# Patient Record
Sex: Male | Born: 1935 | Race: White | Hispanic: No | Marital: Married | Smoking: Former smoker
Health system: Southern US, Community
[De-identification: ages and names within clinical notes are randomized; demographics above are authoritative.]

## PROBLEM LIST (undated history)

## (undated) DIAGNOSIS — Z951 Presence of aortocoronary bypass graft: Secondary | ICD-10-CM

## (undated) DIAGNOSIS — I4891 Unspecified atrial fibrillation: Secondary | ICD-10-CM

## (undated) DIAGNOSIS — G20 Parkinson's disease: Secondary | ICD-10-CM

## (undated) DIAGNOSIS — I714 Abdominal aortic aneurysm, without rupture, unspecified: Secondary | ICD-10-CM

## (undated) DIAGNOSIS — Z87442 Personal history of urinary calculi: Secondary | ICD-10-CM

## (undated) DIAGNOSIS — R32 Unspecified urinary incontinence: Secondary | ICD-10-CM

## (undated) DIAGNOSIS — E785 Hyperlipidemia, unspecified: Secondary | ICD-10-CM

## (undated) DIAGNOSIS — K219 Gastro-esophageal reflux disease without esophagitis: Secondary | ICD-10-CM

## (undated) DIAGNOSIS — I2510 Atherosclerotic heart disease of native coronary artery without angina pectoris: Secondary | ICD-10-CM

## (undated) DIAGNOSIS — I499 Cardiac arrhythmia, unspecified: Secondary | ICD-10-CM

## (undated) DIAGNOSIS — R011 Cardiac murmur, unspecified: Secondary | ICD-10-CM

## (undated) DIAGNOSIS — G20A1 Parkinson's disease without dyskinesia, without mention of fluctuations: Secondary | ICD-10-CM

## (undated) DIAGNOSIS — C801 Malignant (primary) neoplasm, unspecified: Secondary | ICD-10-CM

## (undated) DIAGNOSIS — Z9889 Other specified postprocedural states: Secondary | ICD-10-CM

## (undated) DIAGNOSIS — N189 Chronic kidney disease, unspecified: Secondary | ICD-10-CM

## (undated) DIAGNOSIS — K5730 Diverticulosis of large intestine without perforation or abscess without bleeding: Secondary | ICD-10-CM

## (undated) DIAGNOSIS — R112 Nausea with vomiting, unspecified: Secondary | ICD-10-CM

## (undated) DIAGNOSIS — M1990 Unspecified osteoarthritis, unspecified site: Secondary | ICD-10-CM

## (undated) DIAGNOSIS — I10 Essential (primary) hypertension: Secondary | ICD-10-CM

## (undated) DIAGNOSIS — T8859XA Other complications of anesthesia, initial encounter: Secondary | ICD-10-CM

## (undated) DIAGNOSIS — T4145XA Adverse effect of unspecified anesthetic, initial encounter: Secondary | ICD-10-CM

## (undated) DIAGNOSIS — R0602 Shortness of breath: Secondary | ICD-10-CM

## (undated) HISTORY — PX: PROSTATECTOMY: SHX69

## (undated) HISTORY — DX: Unspecified atrial fibrillation: I48.91

## (undated) HISTORY — PX: APPENDECTOMY: SHX54

## (undated) HISTORY — PX: CATARACT EXTRACTION, BILATERAL: SHX1313

## (undated) HISTORY — PX: CORONARY ARTERY BYPASS GRAFT: SHX141

## (undated) HISTORY — DX: Hyperlipidemia, unspecified: E78.5

## (undated) HISTORY — DX: Presence of aortocoronary bypass graft: Z95.1

## (undated) HISTORY — PX: TONSILLECTOMY: SUR1361

## (undated) HISTORY — PX: CHOLECYSTECTOMY: SHX55

## (undated) HISTORY — DX: Atherosclerotic heart disease of native coronary artery without angina pectoris: I25.10

## (undated) HISTORY — PX: HERNIA REPAIR: SHX51

## (undated) HISTORY — DX: Essential (primary) hypertension: I10

---

## 1994-02-20 HISTORY — PX: CARDIAC CATHETERIZATION: SHX172

## 1999-09-02 DIAGNOSIS — Z951 Presence of aortocoronary bypass graft: Secondary | ICD-10-CM

## 1999-09-02 HISTORY — PX: CORONARY ARTERY BYPASS GRAFT: SHX141

## 1999-09-02 HISTORY — DX: Presence of aortocoronary bypass graft: Z95.1

## 2009-08-29 ENCOUNTER — Ambulatory Visit: Admission: RE | Admit: 2009-08-29 | Discharge: 2009-08-31 | Payer: Self-pay | Admitting: Radiation Oncology

## 2009-09-04 ENCOUNTER — Ambulatory Visit: Admission: RE | Admit: 2009-09-04 | Discharge: 2009-09-11 | Payer: Self-pay | Admitting: Radiation Oncology

## 2009-10-08 ENCOUNTER — Encounter (INDEPENDENT_AMBULATORY_CARE_PROVIDER_SITE_OTHER): Payer: Self-pay | Admitting: Urology

## 2009-10-08 ENCOUNTER — Inpatient Hospital Stay (HOSPITAL_COMMUNITY): Admission: RE | Admit: 2009-10-08 | Discharge: 2009-10-10 | Payer: Self-pay | Admitting: Urology

## 2010-11-21 LAB — PROTIME-INR
INR: 1.08 (ref 0.00–1.49)
Prothrombin Time: 13.9 seconds (ref 11.6–15.2)

## 2010-11-21 LAB — BASIC METABOLIC PANEL
BUN: 21 mg/dL (ref 6–23)
CO2: 28 mEq/L (ref 19–32)
Chloride: 107 mEq/L (ref 96–112)
Potassium: 3.9 mEq/L (ref 3.5–5.1)

## 2010-11-21 LAB — CBC
HCT: 44.1 % (ref 39.0–52.0)
Platelets: 152 10*3/uL (ref 150–400)
RBC: 4.96 MIL/uL (ref 4.22–5.81)
WBC: 4.8 10*3/uL (ref 4.0–10.5)

## 2010-11-21 LAB — HEMOGLOBIN AND HEMATOCRIT, BLOOD
HCT: 40.4 % (ref 39.0–52.0)
Hemoglobin: 13.8 g/dL (ref 13.0–17.0)

## 2012-05-25 ENCOUNTER — Other Ambulatory Visit (HOSPITAL_COMMUNITY): Payer: Self-pay | Admitting: Internal Medicine

## 2012-05-25 ENCOUNTER — Other Ambulatory Visit (HOSPITAL_COMMUNITY): Payer: Self-pay

## 2012-05-25 ENCOUNTER — Ambulatory Visit (HOSPITAL_COMMUNITY)
Admission: RE | Admit: 2012-05-25 | Discharge: 2012-05-25 | Disposition: A | Discharge status: Home / Self Care | Payer: Medicare PPO | Source: Ambulatory Visit | Attending: Internal Medicine | Admitting: Internal Medicine

## 2012-05-25 DIAGNOSIS — R42 Dizziness and giddiness: Secondary | ICD-10-CM | POA: Insufficient documentation

## 2012-05-25 DIAGNOSIS — R269 Unspecified abnormalities of gait and mobility: Secondary | ICD-10-CM | POA: Insufficient documentation

## 2012-05-25 DIAGNOSIS — I4891 Unspecified atrial fibrillation: Secondary | ICD-10-CM | POA: Insufficient documentation

## 2012-05-25 DIAGNOSIS — G450 Vertebro-basilar artery syndrome: Secondary | ICD-10-CM | POA: Insufficient documentation

## 2012-05-25 DIAGNOSIS — I639 Cerebral infarction, unspecified: Secondary | ICD-10-CM

## 2012-05-25 DIAGNOSIS — Z7901 Long term (current) use of anticoagulants: Secondary | ICD-10-CM | POA: Insufficient documentation

## 2012-05-25 DIAGNOSIS — I6789 Other cerebrovascular disease: Secondary | ICD-10-CM | POA: Insufficient documentation

## 2012-12-01 ENCOUNTER — Other Ambulatory Visit: Payer: Self-pay | Admitting: Urology

## 2012-12-05 ENCOUNTER — Encounter: Payer: Self-pay | Admitting: *Deleted

## 2012-12-08 ENCOUNTER — Encounter (HOSPITAL_COMMUNITY): Payer: Self-pay | Admitting: *Deleted

## 2012-12-08 ENCOUNTER — Encounter: Payer: Self-pay | Admitting: Cardiovascular Disease

## 2012-12-08 NOTE — Pre-Procedure Instructions (Signed)
Asked to bring blue folder the day of the procedure,insurance card,I.D. driver's license,wear comfortable clothing and have a driver for the day. Asked not to take Advil,Motrin,Ibuprofen,Aleve or any NSAIDS, Aspirin, Aspirin products or Toradol for 72 hours prior to procedure,  No vitamins or herbal medications 7 days prior to procedure. Instructed to take laxative per doctor's office instructions and eat a light dinner the evening before procedure.   To arrive at 1530  for lithotripsy procedure. 

## 2012-12-23 ENCOUNTER — Encounter (HOSPITAL_COMMUNITY): Payer: Self-pay | Admitting: Pharmacy Technician

## 2012-12-24 NOTE — H&P (Signed)
History of Present Illness  Adam Kane is a 79 year old with the following urologic history:  1) Prostate cancer: He is s/p a BNS RAL radical prostatectomy on 10/08/09. His PSA has been undetectable since surgery.  TNM stage: pT2c N0 Mx Gleason 3+4=7 Surgical margins: Negative Pretreatment PSA: 3.45 Pretreatment SHIM: 24  2) Urolithiasis: He was noted to have incidentally detected microscopic hematuria in August 2012. This was rechecked the following month and was persistent. He has had no gross hematuria.  He did smoke 1/2 PPD for 30 years but quit over 25 years ago.  There is no family history of kidney or bladder cancer. He completed a full urologic evaluation in October 2012 including CT imaging and cystoscopy and was found to have a non-obstructing 7 mm LUP renal calculus.    Interval history:  He is follows up today for prostate cancer surveillance over 3 years out from his radical prostatectomy. He also follows up for further evaluation of his known left renal calculus. He has denied any gross hematuria, flank pain, or passage of any stones over the past 6 months.     Past Medical History Problems  1. History of  Arthritis V13.4 2. History of  Atrial Fibrillation 427.31 3. History of  Cath Stent Placement 4. History of  Coronary Artery Disease V12.59 5. History of  Heart Disease 429.9 6. History of  Hypercholesterolemia 272.0 7. History of  Hypertension 401.9 8. Prostate Cancer 185  Surgical History Problems  1. History of  CABG (CABG) V45.81 2. History of  Cholecystectomy 3. History of  Inguinal Hernia Repair 4. History of  Prostatectomy Robotic-Assisted 5. History of  Umbilical Hernia Repair  Current Meds 1. Adult Aspirin Low Strength 81 MG Oral Tablet Dispersible; Therapy: (Recorded:14Dec2010) to 2. Atorvastatin Calcium 40 MG Oral Tablet; Therapy: 31Jul2012 to 3. Carvedilol 25 MG Oral Tablet; Therapy: (Recorded:02Oct2012) to 4. Coumadin 5 MG Oral Tablet; Therapy:  (Recorded:14Dec2010) to 5. Lovaza 1 GM Oral Capsule; Therapy: (Recorded:02Oct2012) to 6. Nabumetone 500 MG Oral Tablet; Therapy: 21Apr2012 to 7. Omeprazole 20 MG Oral Capsule Delayed Release; Therapy: (Recorded:14Dec2010) to 8. Tiazac 240 MG/24HR CPCR; Therapy: (Recorded:14Dec2010) to 9. Vitamin D3 CAPS; Therapy: (Recorded:27Feb2013) to 10. Zetia 10 MG Oral Tablet; Therapy: (Recorded:14Dec2010) to  Allergies Medication  1. Dilaudid TABS 2. Morphine Sulfate (PF) SOLN  Family History Problems  1. Maternal history of  Breast Cancer V16.3 Denied  2. Family history of  Prostate Cancer  Social History Problems  1. Marital History - Currently Married 2. Occupation: retired 3. Tobacco Use 305.1 1/2 ppd x 30 years =quit 25 years Denied  4. History of  Alcohol Use  Vitals Vital Signs [Data Includes: Last 1 Day]  01Apr2014 02:14PM  BMI Calculated: 29.05 BSA Calculated: 2.45 Height: 6 ft 5 in Weight: 246 lb  Blood Pressure: 116 / 67 Heart Rate: 61  Physical Exam Constitutional: Well nourished and well developed . No acute distress.  Pulmonary: No respiratory distress and normal respiratory rhythm and effort.  Cardiovascular: Heart rate and rhythm are normal . No peripheral edema.  Abdomen: The abdomen is soft and nontender. No CVA tenderness.  Neuro/Psych:. Mood and affect are appropriate.    Results/Data Urine [Data Includes: Last 1 Day]   01Apr2014  COLOR AMBER   APPEARANCE CLEAR   SPECIFIC GRAVITY 1.025   pH 6.5   GLUCOSE NEG mg/dL  BILIRUBIN NEG   KETONE NEG mg/dL  BLOOD SMALL   PROTEIN TRACE mg/dL  UROBILINOGEN 2 mg/dL  NITRITE  NEG   LEUKOCYTE ESTERASE NEG   SQUAMOUS EPITHELIAL/HPF RARE   WBC 0-2 WBC/hpf  RBC 7-10 RBC/hpf  BACTERIA RARE   CRYSTALS NONE SEEN   CASTS NONE SEEN   Other MUCUS     I independently reviewed his KUB x-ray. This does demonstrate interval growth of his left renal calculus. It measured approximately 8 mm one year ago and currently  measures 12.5 mm. It is in a similar upper pole nonobstructing position in the kidney.   Assessment Assessed  1. Prostate Cancer 185 2. Male Stress Incontinence 788.32 3. Nephrolithiasis 592.0  Plan Health Maintenance (V70.0)  1. UA With REFLEX  Done: 01Apr2014 01:56PM Nephrolithiasis (592.0)  2. Follow-up Schedule Surgery Office  Follow-up  Done: 01Apr2014 Prostate Cancer (185)  3. PSA  Done: 01Apr2014 4. VENIPUNCTURE  Done: 01Apr2014  Discussion/Summary 1. Prostate cancer: His PSA will be checked today. He'll tentatively followup in one year for annual surveillance.  2. Left renal calculus: There has been significant interval stone growth over the past year. I therefore outlined 2 approaches including proactive treatment at this time versus ongoing observation with treatment only should he develop complications or symptoms in the future. I explained that with the interval stone growth that further stone growth might make him a poor candidate for noninvasive shockwave lithotripsy whereas I think he could undergo this therapy at this point as well as all other available treatment options including ureteroscopic therapy or percutaneous nephrolithotomy. After reviewing all of his options both for treatment versus observation, he does wish to proceed with elective treatment of his stone at this point in order to be able to undergo shockwave lithotripsy before the stone would reach a size where lithotripsy would not be indicated. We have reviewed the potential risks of this treatment including but not limited to bleeding, infection, damage or loss of kidney, failure of the procedure, and potential need for additional treatment. He understands he will temporarily need to stop his Coumadin which he takes for atrial fibrillation and will be instructed to stop this 5 days prior. He will need an INR the day of his treatment. He will then have a left renal ultrasound performed approximately 48-72 hours  after his treatment prior to restarting Coumadin per protocol.  CC: Dr. Rodrigo Ran     Verified Results PSA1 01Apr2014 03:50PM1 Adam Kane  SPECIMEN TYPE: BLOOD  [Dec 01, 2012 8:56AM Adam Kane, Ashyia Schraeder] Notify Patient - PSA is undetectable.   Test Name Result Flag Reference  PSA1 <0.01 ng/mL1  <=4.001  RESULT REPEATED AND VERIFIED. TEST METHODOLOGY: ECLIA PSA (ELECTROCHEMILUMINESCENCE IMMUNOASSAY)     1. Amended By: Heloise Purpura; 12/01/2012 8:56 AMEST  Signatures Electronically signed by : Heloise Purpura, M.D.; Dec 01 2012  8:57AM

## 2012-12-27 ENCOUNTER — Ambulatory Visit (HOSPITAL_COMMUNITY)
Admission: RE | Admit: 2012-12-27 | Discharge: 2012-12-27 | Disposition: A | Discharge status: Home / Self Care | Payer: Medicare Other | Source: Ambulatory Visit | Attending: Urology | Admitting: Urology

## 2012-12-27 ENCOUNTER — Encounter (HOSPITAL_COMMUNITY): Payer: Self-pay | Admitting: *Deleted

## 2012-12-27 ENCOUNTER — Ambulatory Visit (HOSPITAL_COMMUNITY): Payer: Medicare Other

## 2012-12-27 ENCOUNTER — Encounter (HOSPITAL_COMMUNITY)
Admission: RE | Disposition: A | Discharge status: Home / Self Care | Payer: Self-pay | Source: Ambulatory Visit | Attending: Urology

## 2012-12-27 DIAGNOSIS — I519 Heart disease, unspecified: Secondary | ICD-10-CM | POA: Insufficient documentation

## 2012-12-27 DIAGNOSIS — N200 Calculus of kidney: Secondary | ICD-10-CM | POA: Insufficient documentation

## 2012-12-27 DIAGNOSIS — E7800 Pure hypercholesterolemia, unspecified: Secondary | ICD-10-CM | POA: Insufficient documentation

## 2012-12-27 DIAGNOSIS — Z9861 Coronary angioplasty status: Secondary | ICD-10-CM | POA: Insufficient documentation

## 2012-12-27 DIAGNOSIS — M129 Arthropathy, unspecified: Secondary | ICD-10-CM | POA: Insufficient documentation

## 2012-12-27 DIAGNOSIS — I4891 Unspecified atrial fibrillation: Secondary | ICD-10-CM | POA: Insufficient documentation

## 2012-12-27 DIAGNOSIS — I10 Essential (primary) hypertension: Secondary | ICD-10-CM | POA: Insufficient documentation

## 2012-12-27 DIAGNOSIS — N393 Stress incontinence (female) (male): Secondary | ICD-10-CM | POA: Insufficient documentation

## 2012-12-27 DIAGNOSIS — I2510 Atherosclerotic heart disease of native coronary artery without angina pectoris: Secondary | ICD-10-CM | POA: Insufficient documentation

## 2012-12-27 DIAGNOSIS — Z9079 Acquired absence of other genital organ(s): Secondary | ICD-10-CM | POA: Insufficient documentation

## 2012-12-27 DIAGNOSIS — Z8546 Personal history of malignant neoplasm of prostate: Secondary | ICD-10-CM | POA: Insufficient documentation

## 2012-12-27 DIAGNOSIS — Z87891 Personal history of nicotine dependence: Secondary | ICD-10-CM | POA: Insufficient documentation

## 2012-12-27 HISTORY — DX: Chronic kidney disease, unspecified: N18.9

## 2012-12-27 LAB — PROTIME-INR: Prothrombin Time: 13.4 seconds (ref 11.6–15.2)

## 2012-12-27 SURGERY — LITHOTRIPSY, ESWL
Anesthesia: LOCAL | Laterality: Left

## 2012-12-27 MED ORDER — TAMSULOSIN HCL 0.4 MG PO CAPS: 0.4000 mg | ORAL_CAPSULE | Freq: Every day | ORAL | Status: DC

## 2012-12-27 MED ORDER — CIPROFLOXACIN HCL 500 MG PO TABS
500.0000 mg | ORAL_TABLET | ORAL | Status: AC
  Administered 2012-12-27: 500 mg via ORAL
  Filled 2012-12-27: qty 1

## 2012-12-27 MED ORDER — DIPHENHYDRAMINE HCL 25 MG PO CAPS
25.0000 mg | ORAL_CAPSULE | ORAL | Status: AC
  Administered 2012-12-27: 25 mg via ORAL
  Filled 2012-12-27: qty 1

## 2012-12-27 MED ORDER — DEXTROSE-NACL 5-0.45 % IV SOLN
INTRAVENOUS | Status: DC
  Administered 2012-12-27: 17:00:00 via INTRAVENOUS

## 2012-12-27 MED ORDER — DIAZEPAM 5 MG PO TABS
10.0000 mg | ORAL_TABLET | ORAL | Status: AC
  Administered 2012-12-27: 10 mg via ORAL
  Filled 2012-12-27: qty 2

## 2012-12-27 NOTE — Interval H&P Note (Signed)
History and Physical Interval Note:  12/27/2012 6:13 PM  Adam Kane  has presented today for surgery, with the diagnosis of LEFT RENAL CALCULUS  The various methods of treatment have been discussed with the patient and family. After consideration of risks, benefits and other options for treatment, the patient has consented to  Procedure(s): EXTRACORPOREAL SHOCK WAVE LITHOTRIPSY (ESWL) (Left) as a surgical intervention .  The patient's history has been reviewed, patient examined, no change in status, stable for surgery.  I have reviewed the patient's chart and labs.  Questions were answered to the patient's satisfaction.     Ladina Shutters,LES

## 2013-03-02 ENCOUNTER — Inpatient Hospital Stay (HOSPITAL_COMMUNITY)
Admission: EM | Admit: 2013-03-02 | Discharge: 2013-03-08 | DRG: 491 | Disposition: A | Discharge status: Home / Self Care | Payer: Worker's Compensation | Attending: Internal Medicine | Admitting: Internal Medicine

## 2013-03-02 ENCOUNTER — Encounter (HOSPITAL_COMMUNITY): Payer: Self-pay | Admitting: Emergency Medicine

## 2013-03-02 DIAGNOSIS — M549 Dorsalgia, unspecified: Secondary | ICD-10-CM

## 2013-03-02 DIAGNOSIS — D72829 Elevated white blood cell count, unspecified: Secondary | ICD-10-CM | POA: Diagnosis not present

## 2013-03-02 DIAGNOSIS — I2510 Atherosclerotic heart disease of native coronary artery without angina pectoris: Secondary | ICD-10-CM | POA: Diagnosis present

## 2013-03-02 DIAGNOSIS — Z951 Presence of aortocoronary bypass graft: Secondary | ICD-10-CM

## 2013-03-02 DIAGNOSIS — Z8546 Personal history of malignant neoplasm of prostate: Secondary | ICD-10-CM

## 2013-03-02 DIAGNOSIS — Z7982 Long term (current) use of aspirin: Secondary | ICD-10-CM

## 2013-03-02 DIAGNOSIS — I4891 Unspecified atrial fibrillation: Secondary | ICD-10-CM | POA: Diagnosis present

## 2013-03-02 DIAGNOSIS — Z87891 Personal history of nicotine dependence: Secondary | ICD-10-CM

## 2013-03-02 DIAGNOSIS — I10 Essential (primary) hypertension: Secondary | ICD-10-CM | POA: Diagnosis present

## 2013-03-02 DIAGNOSIS — Z79899 Other long term (current) drug therapy: Secondary | ICD-10-CM

## 2013-03-02 DIAGNOSIS — E785 Hyperlipidemia, unspecified: Secondary | ICD-10-CM | POA: Diagnosis present

## 2013-03-02 DIAGNOSIS — R52 Pain, unspecified: Secondary | ICD-10-CM | POA: Diagnosis present

## 2013-03-02 DIAGNOSIS — Z87442 Personal history of urinary calculi: Secondary | ICD-10-CM

## 2013-03-02 DIAGNOSIS — M5126 Other intervertebral disc displacement, lumbar region: Principal | ICD-10-CM | POA: Diagnosis present

## 2013-03-02 DIAGNOSIS — Z7901 Long term (current) use of anticoagulants: Secondary | ICD-10-CM

## 2013-03-02 DIAGNOSIS — M519 Unspecified thoracic, thoracolumbar and lumbosacral intervertebral disc disorder: Secondary | ICD-10-CM | POA: Diagnosis present

## 2013-03-02 DIAGNOSIS — R7309 Other abnormal glucose: Secondary | ICD-10-CM | POA: Diagnosis not present

## 2013-03-02 DIAGNOSIS — I482 Chronic atrial fibrillation, unspecified: Secondary | ICD-10-CM | POA: Diagnosis present

## 2013-03-02 LAB — BASIC METABOLIC PANEL
BUN: 27 mg/dL — ABNORMAL HIGH (ref 6–23)
CO2: 31 mEq/L (ref 19–32)
Calcium: 9.4 mg/dL (ref 8.4–10.5)
Chloride: 102 mEq/L (ref 96–112)
Creatinine, Ser: 0.96 mg/dL (ref 0.50–1.35)
GFR calc Af Amer: >90 mL/min (ref >90)
GFR calc non Af Amer: 78 mL/min — ABNORMAL LOW (ref >90)
Glucose, Bld: 112 mg/dL — ABNORMAL HIGH (ref 70–99)
Potassium: 4.1 mEq/L (ref 3.5–5.1)
Sodium: 137 mEq/L (ref 135–145)

## 2013-03-02 LAB — CBC WITH DIFFERENTIAL/PLATELET
Basophils Absolute: 0 10*3/uL (ref 0.0–0.1)
Basophils Relative: 0 % (ref 0–1)
Eosinophils Absolute: 0 10*3/uL (ref 0.0–0.7)
Eosinophils Relative: 0 % (ref 0–5)
HCT: 49.4 % (ref 39.0–52.0)
Hemoglobin: 17.3 g/dL — ABNORMAL HIGH (ref 13.0–17.0)
Lymphocytes Relative: 11 % — ABNORMAL LOW (ref 12–46)
Lymphs Abs: 1 10*3/uL (ref 0.7–4.0)
MCH: 30.8 pg (ref 26.0–34.0)
MCHC: 35 g/dL (ref 30.0–36.0)
MCV: 87.9 fL (ref 78.0–100.0)
Monocytes Absolute: 0.3 10*3/uL (ref 0.1–1.0)
Monocytes Relative: 4 % (ref 3–12)
Neutro Abs: 7.7 10*3/uL (ref 1.7–7.7)
Neutrophils Relative %: 86 % — ABNORMAL HIGH (ref 43–77)
Platelets: 138 10*3/uL — ABNORMAL LOW (ref 150–400)
RBC: 5.62 MIL/uL (ref 4.22–5.81)
RDW: 13.9 % (ref 11.5–15.5)
WBC: 9 10*3/uL (ref 4.0–10.5)

## 2013-03-02 LAB — PROTIME-INR
INR: 2.36 — ABNORMAL HIGH (ref 0.00–1.49)
Prothrombin Time: 25 seconds — ABNORMAL HIGH (ref 11.6–15.2)

## 2013-03-02 MED ORDER — SENNA 8.6 MG PO TABS
1.0000 | ORAL_TABLET | Freq: Every day | ORAL | Status: DC
  Administered 2013-03-02 – 2013-03-06 (×5): 8.6 mg via ORAL
  Filled 2013-03-02 (×6): qty 1

## 2013-03-02 MED ORDER — CARVEDILOL 12.5 MG PO TABS
12.5000 mg | ORAL_TABLET | Freq: Two times a day (BID) | ORAL | Status: DC
  Administered 2013-03-02 – 2013-03-08 (×12): 12.5 mg via ORAL
  Filled 2013-03-02 (×14): qty 1

## 2013-03-02 MED ORDER — KETOROLAC TROMETHAMINE 30 MG/ML IJ SOLN
15.0000 mg | Freq: Once | INTRAMUSCULAR | Status: AC
  Administered 2013-03-02: 15 mg via INTRAVENOUS
  Filled 2013-03-02: qty 1

## 2013-03-02 MED ORDER — ONDANSETRON HCL 4 MG/2ML IJ SOLN: 4.0000 mg | Freq: Four times a day (QID) | INTRAMUSCULAR | Status: DC | PRN

## 2013-03-02 MED ORDER — NABUMETONE 500 MG PO TABS
500.0000 mg | ORAL_TABLET | Freq: Every evening | ORAL | Status: DC
  Administered 2013-03-02 – 2013-03-06 (×5): 500 mg via ORAL
  Filled 2013-03-02 (×7): qty 1

## 2013-03-02 MED ORDER — DEXAMETHASONE SODIUM PHOSPHATE 10 MG/ML IJ SOLN
10.0000 mg | Freq: Once | INTRAMUSCULAR | Status: AC
  Administered 2013-03-02: 10 mg via INTRAVENOUS
  Filled 2013-03-02: qty 1

## 2013-03-02 MED ORDER — DILTIAZEM HCL ER BEADS 240 MG PO CP24
240.0000 mg | ORAL_CAPSULE | Freq: Every morning | ORAL | Status: DC
  Administered 2013-03-03 – 2013-03-06 (×4): 240 mg via ORAL
  Filled 2013-03-02 (×5): qty 1

## 2013-03-02 MED ORDER — DEXAMETHASONE SODIUM PHOSPHATE 10 MG/ML IJ SOLN
5.0000 mg | Freq: Four times a day (QID) | INTRAMUSCULAR | Status: DC
  Administered 2013-03-02 – 2013-03-07 (×19): 5 mg via INTRAVENOUS
  Filled 2013-03-02 (×26): qty 0.5

## 2013-03-02 MED ORDER — SODIUM CHLORIDE 0.9 % IV BOLUS (SEPSIS)
1000.0000 mL | Freq: Once | INTRAVENOUS | Status: AC
  Administered 2013-03-02: 1000 mL via INTRAVENOUS

## 2013-03-02 MED ORDER — VITAMIN K1 10 MG/ML IJ SOLN
5.0000 mg | Freq: Once | INTRAVENOUS | Status: AC
  Administered 2013-03-02: 5 mg via INTRAVENOUS
  Filled 2013-03-02: qty 0.5

## 2013-03-02 MED ORDER — SODIUM CHLORIDE 0.9 % IV SOLN
INTRAVENOUS | Status: DC
  Administered 2013-03-02 – 2013-03-03 (×3): via INTRAVENOUS
  Administered 2013-03-04: 20 mL/h via INTRAVENOUS

## 2013-03-02 MED ORDER — FENTANYL CITRATE 0.05 MG/ML IJ SOLN
100.0000 ug | Freq: Once | INTRAMUSCULAR | Status: AC
  Administered 2013-03-02: 100 ug via INTRAVENOUS
  Filled 2013-03-02: qty 2

## 2013-03-02 MED ORDER — ATORVASTATIN CALCIUM 40 MG PO TABS
40.0000 mg | ORAL_TABLET | Freq: Every day | ORAL | Status: DC
  Administered 2013-03-02 – 2013-03-07 (×6): 40 mg via ORAL
  Filled 2013-03-02 (×7): qty 1

## 2013-03-02 MED ORDER — EZETIMIBE 10 MG PO TABS
10.0000 mg | ORAL_TABLET | Freq: Every morning | ORAL | Status: DC
  Administered 2013-03-03 – 2013-03-08 (×5): 10 mg via ORAL
  Filled 2013-03-02 (×6): qty 1

## 2013-03-02 MED ORDER — FENTANYL CITRATE 0.05 MG/ML IJ SOLN
50.0000 ug | INTRAMUSCULAR | Status: DC | PRN
  Administered 2013-03-03: 50 ug via INTRAVENOUS
  Filled 2013-03-02: qty 2

## 2013-03-02 MED ORDER — CARVEDILOL 12.5 MG PO TABS
12.5000 mg | ORAL_TABLET | Freq: Two times a day (BID) | ORAL | Status: DC
  Filled 2013-03-02: qty 1

## 2013-03-02 MED ORDER — FENTANYL CITRATE 0.05 MG/ML IJ SOLN
100.0000 ug | INTRAMUSCULAR | Status: DC | PRN
  Administered 2013-03-02 – 2013-03-04 (×15): 100 ug via INTRAVENOUS
  Filled 2013-03-02 (×16): qty 2

## 2013-03-02 MED ORDER — ACETAMINOPHEN 325 MG PO TABS: 650.0000 mg | ORAL_TABLET | Freq: Four times a day (QID) | ORAL | Status: DC | PRN

## 2013-03-02 MED ORDER — POLYETHYLENE GLYCOL 3350 17 G PO PACK
17.0000 g | PACK | Freq: Every day | ORAL | Status: DC | PRN
  Administered 2013-03-06: 17 g via ORAL
  Filled 2013-03-02: qty 1

## 2013-03-02 MED ORDER — PANTOPRAZOLE SODIUM 40 MG PO TBEC
40.0000 mg | DELAYED_RELEASE_TABLET | Freq: Every day | ORAL | Status: DC
  Administered 2013-03-02 – 2013-03-06 (×5): 40 mg via ORAL
  Filled 2013-03-02 (×5): qty 1

## 2013-03-02 MED ORDER — ONDANSETRON HCL 4 MG/2ML IJ SOLN
4.0000 mg | Freq: Once | INTRAMUSCULAR | Status: AC
  Administered 2013-03-02: 4 mg via INTRAVENOUS
  Filled 2013-03-02: qty 2

## 2013-03-02 MED ORDER — DIAZEPAM 5 MG/ML IJ SOLN
5.0000 mg | Freq: Once | INTRAMUSCULAR | Status: AC
  Administered 2013-03-02: 5 mg via INTRAVENOUS
  Filled 2013-03-02: qty 2

## 2013-03-02 NOTE — Consult Note (Signed)
Reason for Consult:lumbar hnp Referring Physician: SHILO PAUWELS is an 79 y.o. male.  HPI: patient seen in the er after having an urgent mri today. For several  days he has been complaining of of lumbar pain with radiation to the left leg assoiiated with sensory changes . Medication has not helped.  Past Medical History  Diagnosis Date  . CAD (coronary artery disease)   . S/P CABG (coronary artery bypass graft) 2001  . Dyslipidemia   . Systemic hypertension   . A-fib   . Chronic kidney disease     kidney stone    Past Surgical History  Procedure Laterality Date  . Coronary artery bypass graft  2001    LIMA to LAD,SVG to PDA  . Cholecystectomy    . Hernia repair    . Prostatectomy    . Cardiac catheterization  02/20/94    Family History  Problem Relation Age of Onset  . Cancer Mother     Social History:  reports that he has quit smoking. His smoking use included Cigarettes. He smoked 0.00 packs per day. He has never used smokeless tobacco. He reports that he does not drink alcohol or use illicit drugs.  Allergies:  Allergies  Allergen Reactions  . Dilaudid (Hydromorphone Hcl) Other (See Comments)    PASSES OUT  . Morphine And Related Other (See Comments)    PASSES OUT    Medicatio see hp  Results for orders placed during the hospital encounter of 03/02/13 (from the past 48 hour(s))  PROTIME-INR     Status: Abnormal   Collection Time    03/02/13  2:25 PM      Result Value Range   Prothrombin Time 25.0 (*) 11.6 - 15.2 seconds   INR 2.36 (*) 0.00 - 1.49  CBC WITH DIFFERENTIAL     Status: Abnormal   Collection Time    03/02/13  2:25 PM      Result Value Range   WBC 9.0  4.0 - 10.5 K/uL   RBC 5.62  4.22 - 5.81 MIL/uL   Hemoglobin 17.3 (*) 13.0 - 17.0 g/dL   HCT 40.9  81.1 - 91.4 %   MCV 87.9  78.0 - 100.0 fL   MCH 30.8  26.0 - 34.0 pg   MCHC 35.0  30.0 - 36.0 g/dL   RDW 78.2  95.6 - 21.3 %   Platelets 138 (*) 150 - 400 K/uL   Neutrophils Relative %  86 (*) 43 - 77 %   Neutro Abs 7.7  1.7 - 7.7 K/uL   Lymphocytes Relative 11 (*) 12 - 46 %   Lymphs Abs 1.0  0.7 - 4.0 K/uL   Monocytes Relative 4  3 - 12 %   Monocytes Absolute 0.3  0.1 - 1.0 K/uL   Eosinophils Relative 0  0 - 5 %   Eosinophils Absolute 0.0  0.0 - 0.7 K/uL   Basophils Relative 0  0 - 1 %   Basophils Absolute 0.0  0.0 - 0.1 K/uL  BASIC METABOLIC PANEL     Status: Abnormal   Collection Time    03/02/13  2:25 PM      Result Value Range   Sodium 137  135 - 145 mEq/L   Potassium 4.1  3.5 - 5.1 mEq/L   Chloride 102  96 - 112 mEq/L   CO2 31  19 - 32 mEq/L   Glucose, Bld 112 (*) 70 - 99 mg/dL   BUN 27 (*)  6 - 23 mg/dL   Creatinine, Ser 1.61  0.50 - 1.35 mg/dL   Calcium 9.4  8.4 - 09.6 mg/dL   GFR calc non Af Amer 78 (*) >90 mL/min   GFR calc Af Amer >90  >90 mL/min   Comment:            The eGFR has been calculated     using the CKD EPI equation.     This calculation has not been     validated in all clinical     situations.     eGFR's persistently     <90 mL/min signify     possible Chronic Kidney Disease.    No results found.  Review of Systems  Constitutional: Negative.   HENT: Negative.   Eyes: Negative.   Respiratory: Negative.   Cardiovascular:       Atrial fib on coumadin  Gastrointestinal: Negative.   Genitourinary:       Prostate cancer  Musculoskeletal: Positive for back pain.  Skin: Negative.   Neurological: Positive for sensory change and focal weakness.  Endo/Heme/Allergies: Negative.   Psychiatric/Behavioral: Negative.    Blood pressure 121/79, pulse 114, temperature 98.2 F (36.8 C), temperature source Oral, resp. rate 17, SpO2 95.00%. Physical Examhent, nl. Neck, nl. Cv, nl.  Sacr in chest from cardiac surgery Lungs clear. Abdomen, soft , extremities nl. NEURO at present he got iv fentanyl. Weakness of quadriceps. Minimal df. Left foot. SLR POSITIVE AT 60 DEGREES IN LEFT, NEGATIVE IN RIGHT.  Assessment/Plan: He will be admitted by the  hospitalist . They will reverse the coumadin. i dis speak with him and daughter. They want dr Jordan Likes or dr Yetta Barre to take care of his case. i will let them know in am  Keirah Konitzer M 03/02/2013, 6:27 PM

## 2013-03-02 NOTE — ED Notes (Signed)
Pt sts had MRI today after injuring back at work 1 week ago; pt sts has disc issue pressing on nerves

## 2013-03-02 NOTE — ED Provider Notes (Signed)
History    79 year old male with left lower back pain. Onset while at work approximately one week ago when making a pulling motion while removing sticker from a vehicle. Pain radiates down to the left lower extremity. Progressively worsening since onset. He's been taking Skelaxin and hydrocodone with little relief.   Feels like strength is ok, but not sure because he has been limiting movement because of the severity of the pain he is having. Reports L patellar reflex diminished on recent evaluation. No bladder/bowel incontinence or retention.  Nausea when pain is severe. No hx of back surgery. On coumadin for hx of afib. He had outpatient MRI yesterday through [facility]. Significant for a large left L4/5 intraforaminal disc extrusion - likely free fragment  - resulting in severe stenosis and compression of the L4 nerve root.   CSN: [id number] Arrival date & time 03/02/13  1144  First MD Initiated Contact with Patient 03/02/13 1249     Chief Complaint  Patient presents with  . Back Pain   (Consider location/radiation/quality/duration/timing/severity/associated sxs/prior Treatment) HPI Past Medical History  Diagnosis Date  . CAD (coronary artery disease)   . S/P CABG (coronary artery bypass graft) 2001  . Dyslipidemia   . Systemic hypertension   . A-fib   . Chronic kidney disease     kidney stone   Past Surgical History  Procedure Laterality Date  . Coronary artery bypass graft  2001    LIMA to LAD,SVG to PDA  . Cholecystectomy    . Hernia repair    . Prostatectomy    . Cardiac catheterization  02/20/94   Family History  Problem Relation Age of Onset  . Cancer Mother    History  Substance Use Topics  . Smoking status: Former Smoker    Types: Cigarettes  . Smokeless tobacco: Never Used  . Alcohol Use: No    Review of Systems  All systems reviewed and negative, other than as noted in HPI.   Allergies  Dilaudid and Morphine and related  Home Medications    Current Outpatient Rx  Name  Route  Sig  Dispense  Refill  . aspirin EC 81 MG tablet   Oral   Take 81 mg by mouth daily.         Marland Kitchen atorvastatin (LIPITOR) 40 MG tablet   Oral   Take 40 mg by mouth at bedtime.          . carvedilol (COREG) 25 MG tablet   Oral   Take 12.5 mg by mouth 2 (two) times daily with a meal. Take 1/2 tablet twice daily         . cholecalciferol (VITAMIN D) 1000 UNITS tablet   Oral   Take 1,000 Units by mouth daily.         Marland Kitchen diltiazem (TIAZAC) 240 MG 24 hr capsule   Oral   Take 240 mg by mouth every morning.          . ezetimibe (ZETIA) 10 MG tablet   Oral   Take 10 mg by mouth every morning.          . nabumetone (RELAFEN) 500 MG tablet   Oral   Take 500 mg by mouth every evening.          Marland Kitchen omega-3 acid ethyl esters (LOVAZA) 1 G capsule   Oral   Take 1 g by mouth 2 (two) times daily.         Marland Kitchen omeprazole (PRILOSEC) 20  MG capsule   Oral   Take 20 mg by mouth every morning.          . warfarin (COUMADIN) 2 MG tablet   Oral   Take 2 mg by mouth daily.          BP 179/115  Pulse 73  Temp(Src) 98.2 F (36.8 C) (Oral)  Resp 16  SpO2 98% Physical Exam  Nursing note and vitals reviewed. Constitutional: He appears well-developed and well-nourished. No distress.  HENT:  Head: Normocephalic and atraumatic.  Eyes: Conjunctivae are normal. Right eye exhibits no discharge. Left eye exhibits no discharge.  Neck: Neck supple.  Cardiovascular: Normal rate, regular rhythm and normal heart sounds.  Exam reveals no gallop and no friction rub.   No murmur heard. Pulmonary/Chest: Effort normal and breath sounds normal. No respiratory distress.  Abdominal: Soft. He exhibits no distension. There is no tenderness.  Musculoskeletal: He exhibits no edema and no tenderness.  Lower back normal to inspection. Reports pain in area off midline L lower lumbar/sacral region. Not reproducible with palpation. Strength good with hip flex/ext,  knee flex/ext, foot plantar/dorsiflexion. Palpable DP pulses b/l. Sensation intact to light touch.   Neurological: He is alert.  Skin: Skin is warm and dry.  Psychiatric: He has a normal mood and affect. His behavior is normal. Thought content normal.    ED Course  Procedures (including critical care time) Labs Reviewed  PROTIME-INR - Abnormal; Notable for the following:    Prothrombin Time 25.0 (*)    INR 2.36 (*)    All other components within normal limits  CBC WITH DIFFERENTIAL - Abnormal; Notable for the following:    Hemoglobin 17.3 (*)    Platelets 138 (*)    Neutrophils Relative % 86 (*)    Lymphocytes Relative 11 (*)    All other components within normal limits  BASIC METABOLIC PANEL - Abnormal; Notable for the following:    Glucose, Bld 112 (*)    BUN 27 (*)    GFR calc non Af Amer 78 (*)    All other components within normal limits   No results found.  EKG:  Rhythm: atrial fibrillation Vent. rate 124 BPM PR interval * ms QRS duration 86 ms QT/QTc 320/460 ms ST segments: NS changes   1. L4-L5 disc bulge   2. Intractable back pain     MDM  79yM with worsening radicular L lower back pain. MRI with disc protrusion resulting in severe foraminal stenosis / L4 root compression.  Will tx symptoms. Will discuss with neurosurgery.   2:00 PM  Dr Jeral Fruit aware of pt. Currently in OR.   Pt received doses of fentanyl and also toradol, valium and steroids. Little change in symptoms. Suspect will have to admit for intractable pain.   Discussed with Dr Waynard Edwards and updated on plan per pt/family request.   Discussed with Dr Jeral Fruit again and that having difficulty controlling pt's pain. Requesting medicine admission.   Raeford Razor, MD 03/02/13 1800

## 2013-03-02 NOTE — H&P (Addendum)
[facility] History and Physical  Adam Kane AVW:[id number] DOB: 1933-07-08 DOA: 03/02/2013  Referring physician: EDP PCP: Ezequiel Kayser, MD  Specialists: Cardiology Dr.Croitoru  Chief Complaint: back pain  HPI: Adam Kane is a 79 y.o. 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 This has been progressively getting worse ever since, he went to Urgent care twice was given Vicodin the second time with little to no relief. Finally saw an MD at Urgent care on Monday and was scheduled for an MRI which got done today at [facility] shows Large L4L5 intra foraminal disc extrusion likely free fragment causing severe stenosis and compression of L4 nerve root. As his pain got unbearable today he was brought to the ER by his daughter, has subsequently gotten 3 doses of IV fentanyl each, IV decadron, Valium to finally give him some relief. Case discussed with NSG and medicine admission was requested.     Review of Systems: The patient denies anorexia, fever, weight loss,, vision loss, decreased hearing, hoarseness, chest pain, syncope, dyspnea on exertion, peripheral edema, balance deficits, hemoptysis, abdominal pain, melena, hematochezia, severe indigestion/heartburn, hematuria, incontinence, genital sores, muscle weakness, suspicious skin lesions, transient blindness, difficulty walking, depression, unusual weight change, abnormal bleeding, enlarged lymph nodes, angioedema, and breast masses.    Past Medical History  Diagnosis Date  . CAD (coronary artery disease)   . S/P CABG (coronary artery bypass graft) 2001  . Dyslipidemia   . Systemic hypertension   . A-fib   . Chronic kidney disease     kidney stone   Past Surgical History  Procedure Laterality Date  . Coronary artery bypass graft  2001    LIMA to LAD,SVG to PDA   . Cholecystectomy    . Hernia repair    . Prostatectomy    . Cardiac catheterization  02/20/94   Social History:  reports that he has quit smoking. His smoking use included Cigarettes. He smoked 0.00 packs per day. He has never used smokeless tobacco. He reports that he does not drink alcohol or use illicit drugs. Lives at home with spouse, independent in ADLs  Allergies  Allergen Reactions  . Dilaudid (Hydromorphone Hcl) Other (See Comments)    PASSES OUT  . Morphine And Related Other (See Comments)    PASSES OUT    Family History  Problem Relation Age of Onset  . Cancer Mother     Prior to Admission medications   Medication Sig Start Date End Date Taking? Authorizing Provider  aspirin EC 81 MG tablet Take 81 mg by mouth daily.   Yes Historical Provider, MD  atorvastatin (LIPITOR) 40 MG tablet Take 40 mg by mouth at bedtime.    Yes Historical Provider, MD  carvedilol (COREG) 25 MG tablet Take 12.5 mg by mouth 2 (two) times daily with a meal. Take 1/2 tablet twice daily   Yes Historical Provider, MD  cholecalciferol (VITAMIN D) 1000 UNITS tablet Take 1,000 Units by mouth daily.   Yes Historical Provider, MD  diltiazem (TIAZAC) 240 MG 24 hr capsule Take 240 mg by mouth every morning.    Yes Historical Provider, MD  ezetimibe (ZETIA) 10 MG tablet Take 10 mg by mouth every morning.    Yes Historical Provider, MD  nabumetone (RELAFEN) 500 MG tablet Take 500 mg by mouth every evening.  Yes Historical Provider, MD  omega-3 acid ethyl esters (LOVAZA) 1 G capsule Take 1 g by mouth 2 (two) times daily.   Yes Historical Provider, MD  omeprazole (PRILOSEC) 20 MG capsule Take 20 mg by mouth every morning.    Yes Historical Provider, MD  warfarin (COUMADIN) 2 MG tablet Take 2 mg by mouth daily.   Yes Historical Provider, MD   Physical Exam: Filed Vitals:   03/02/13 1645 03/02/13 1700 03/02/13 1715 03/02/13 1730  BP: 126/82  127/93 140/99  Pulse: 123 111 106 117  Temp:      TempSrc:       Resp: 14 18 14 17   SpO2: 97% 96% 97% 97%     General: AAOx3, a little sleepy from fentanyl, but easily aroused and answers quetsions appropriately  HEENT: PERRLA, EOMI  Cardiovascular: S1S2/RRR  Respiratory: CTAB  Abdomen: soft, Nt, BS present, scar from cholecystectomy and ventral hernia repair  Skin: no rashes   Musculoskeletal: no edema c/c  Psychiatric: appropriate mood and affect  Neurologic: moves all ext, motor 5/5, left leg a little limited by pain, sensory light touch intact  Reflexes : knee jerk/reflexes diminished on L side and brisk on R  Plantars mute  Labs on Admission:  Basic Metabolic Panel:  Recent Labs Lab 03/02/13 1425  NA 137  K 4.1  CL 102  CO2 31  GLUCOSE 112*  BUN 27*  CREATININE 0.96  CALCIUM 9.4   Liver Function Tests: No results found for this basename: AST, ALT, ALKPHOS, BILITOT, PROT, ALBUMIN,  in the last 168 hours No results found for this basename: LIPASE, AMYLASE,  in the last 168 hours No results found for this basename: AMMONIA,  in the last 168 hours CBC:  Recent Labs Lab 03/02/13 1425  WBC 9.0  NEUTROABS 7.7  HGB 17.3*  HCT 49.4  MCV 87.9  PLT 138*   Cardiac Enzymes: No results found for this basename: CKTOTAL, CKMB, CKMBINDEX, TROPONINI,  in the last 168 hours  BNP (last 3 results) No results found for this basename: PROBNP,  in the last 8760 hours CBG: No results found for this basename: GLUCAP,  in the last 168 hours  Radiological Exams on Admission: No results found.    Assessment/Plan  1. Severe back pain, L4 radiculopathy from nerve root impingement -pain control with IV decadron, Narcotics -NSG consult for decompression, D/w Dr.Botero -Hold coumadin, Vitamin K iv x1 -Physical therapy  2. CAD, S/p CABG in 2001 -stable -normal stress test per Cardiologist in [place], [place] in 2013 -Continue Coreg and Zetia -hold ASA -check EKG -I do not feel further cardiac workup needed at this time  3.  Afib: rate slightly up on arrival Improved with pain control -continue Coreg and Diltiazem -hold coumadin as noted above -check INR daily  4. Prostate CA s/p prostatectomy -stable, Followed by Dr.Borden  DVT proph: not needed at this time since INR therapeutic  Code Status: FULL Family Communication: d/w pt and daughter at bedside Disposition Plan: Inpatient  Time spent:53min  Zannie Cove [facility] Pager [phone]  If 7PM-7AM, please contact night-coverage [email or web address] Password [email or web address] 03/02/2013, 5:56 PM

## 2013-03-03 DIAGNOSIS — I10 Essential (primary) hypertension: Secondary | ICD-10-CM

## 2013-03-03 DIAGNOSIS — D72829 Elevated white blood cell count, unspecified: Secondary | ICD-10-CM | POA: Diagnosis not present

## 2013-03-03 LAB — BASIC METABOLIC PANEL
CO2: 26 mEq/L (ref 19–32)
Calcium: 9.7 mg/dL (ref 8.4–10.5)
GFR calc non Af Amer: 81 mL/min — ABNORMAL LOW (ref >90)
Potassium: 3.9 mEq/L (ref 3.5–5.1)
Sodium: 135 mEq/L (ref 135–145)

## 2013-03-03 LAB — CBC
Hemoglobin: 16.3 g/dL (ref 13.0–17.0)
MCH: 30.7 pg (ref 26.0–34.0)
Platelets: 149 10*3/uL — ABNORMAL LOW (ref 150–400)
RBC: 5.31 MIL/uL (ref 4.22–5.81)
WBC: 14.7 10*3/uL — ABNORMAL HIGH (ref 4.0–10.5)

## 2013-03-03 LAB — PROTIME-INR
INR: 1.19 (ref 0.00–1.49)
Prothrombin Time: 14.8 seconds (ref 11.6–15.2)

## 2013-03-03 MED ORDER — HEPARIN (PORCINE) IN NACL 100-0.45 UNIT/ML-% IJ SOLN
1300.0000 [IU]/h | INTRAMUSCULAR | Status: DC
  Administered 2013-03-03 – 2013-03-04 (×2): 1500 [IU]/h via INTRAVENOUS
  Administered 2013-03-05 – 2013-03-06 (×3): 1300 [IU]/h via INTRAVENOUS
  Filled 2013-03-03 (×6): qty 250

## 2013-03-03 NOTE — Progress Notes (Signed)
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 Continue pain control. Holding Coumadin. See below.    S/P CABG (coronary artery bypass graft): Stable medical issue.    Chronic a-fib-rate controlled not a pain is under control. Given concerns for tachycardia with pain, we will start on heparin per pharmacy which can be stopped night before possible surgery    HTN (hypertension): Better as long as pain is under control.    Leukocytosis, unspecified: White blood cell count increased. No fever. Could be stress margination. Repeat labs tomorrow.   Code Status: Full code Family Communication: Plan discussed with patient and his daughter at the bedside  Disposition Plan: Depending on surgery, home versus skilled nursing   Consultants: Lelon Perla, neurosurgery  Procedures:  None  Antibiotics:  None  HPI/Subjective: Patient seen in mid afternoon. Feeling much better. Pain under control. No palpitations. No complaints.  Objective: Filed Vitals:   03/03/13 0537 03/03/13 0950 03/03/13 1030 03/03/13 1439  BP: 106/66 154/109 125/80 108/77  Pulse: 94 97  84  Temp: 97.5 F (36.4 C) 98.1 F (36.7 C)  98.2 F (36.8 C)  TempSrc: Oral Oral  Oral  Resp: 20 18  18   Height:      Weight: 109.3 kg (240 lb 15.4 oz)     SpO2: 92% 95%  95%    Intake/Output Summary (Last 24 hours) at 03/03/13 1811 Last data filed at 03/03/13 1500  Gross per 24 hour  Intake 736.25 ml  Output   1700 ml  Net -963.75 ml   Filed Weights   03/02/13 2100 03/03/13 0537  Weight: 108.954 kg (240 lb 3.2 oz) 109.3 kg (240 lb 15.4 oz)    Exam:   General:  Alert and oriented x3, no acute distress  Cardiovascular: Irregular  rhythm, rate controlled  Respiratory: Clear to auscultation bilaterally  Abdomen: Soft, nontender, nondistended, positive bowel sounds  Musculoskeletal: No clubbing or cyanosis or edema   Data Reviewed: Basic Metabolic Panel:  Recent Labs Lab 03/02/13 1425 03/03/13 1018  NA 137 135  K 4.1 3.9  CL 102 101  CO2 31 26  GLUCOSE 112* 195*  BUN 27* 29*  CREATININE 0.96 0.88  CALCIUM 9.4 9.7   CBC:  Recent Labs Lab 03/02/13 1425 03/03/13 1018  WBC 9.0 14.7*  NEUTROABS 7.7  --   HGB 17.3* 16.3  HCT 49.4 46.1  MCV 87.9 86.8  PLT 138* 149*     Studies: No results found.  Scheduled Meds: . atorvastatin  40 mg Oral QHS  . carvedilol  12.5 mg Oral BID WC  . dexamethasone  5 mg Intravenous Q6H  . diltiazem  240 mg Oral q morning - 10a  . ezetimibe  10 mg Oral q morning - 10a  . nabumetone  500 mg Oral QPM  . pantoprazole  40 mg Oral Daily  . senna  1 tablet Oral Daily   Continuous Infusions: . 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 If 7PM-7AM, please contact night-coverage at [email or web address], password [facility] 03/03/2013, 6:11 PM  LOS: 1 day

## 2013-03-03 NOTE — Progress Notes (Signed)
ANTICOAGULATION CONSULT NOTE - Initial Consult  Pharmacy Consult for heparin Indication: atrial fibrillation  Allergies  Allergen Reactions  . Dilaudid (Hydromorphone Hcl) Other (See Comments)    PASSES OUT  . Morphine And Related Other (See Comments)    PASSES OUT    Patient Measurements: Height: 6\' 4"  (193 cm) Weight: 240 lb 15.4 oz (109.3 kg) IBW/kg (Calculated) : 86.8 Heparin Dosing Weight: 108 kg  Vital Signs: Temp: 98.2 F (36.8 C) (07/03 1439) Temp src: Oral (07/03 1439) BP: 108/77 mmHg (07/03 1439) Pulse Rate: 84 (07/03 1439)  Labs:  Recent Labs  03/02/13 1425 03/03/13 1018  HGB 17.3* 16.3  HCT 49.4 46.1  PLT 138* 149*  LABPROT 25.0* 14.8  INR 2.36* 1.19  CREATININE 0.96 0.88    Estimated Creatinine Clearance: 95.3 ml/min (by C-G formula based on Cr of 0.88).   Medical History: Past Medical History  Diagnosis Date  . CAD (coronary artery disease)   . S/P CABG (coronary artery bypass graft) 2001  . Dyslipidemia   . Systemic hypertension   . A-fib   . Chronic kidney disease     kidney stone    Medications:  Scheduled:  . atorvastatin  40 mg Oral QHS  . carvedilol  12.5 mg Oral BID WC  . dexamethasone  5 mg Intravenous Q6H  . diltiazem  240 mg Oral q morning - 10a  . ezetimibe  10 mg Oral q morning - 10a  . nabumetone  500 mg Oral QPM  . pantoprazole  40 mg Oral Daily  . senna  1 tablet Oral Daily   Infusions:  . sodium chloride 75 mL/hr at 03/03/13 4098    Assessment: 79 yo male with history of afib will be started on heparin therapy.  Patient was on coumadin prior to admission (2mg  po qday) but was reversed with vitamin K for potential surgery due to disc protrusion.  Hgb 11.3, Plt 149 K and INR down to 1.19 from 2.36.  Goal of Therapy:  Heparin level 0.3-0.7 units/ml Monitor platelets by anticoagulation protocol: Yes   Plan:  1) Start heparin at 1500 units/hr. No bolus.  2) Check an 8 hour heparin level after drip is started 3)  Daily heparin level and CBC 4) F/u restart of coumadin when appropriate. 5) Per MD, can stop heparin the night before possible surgery  Aveyah Greenwood, Tsz-Yin 03/03/2013,6:22 PM

## 2013-03-03 NOTE — Progress Notes (Signed)
Patient ID: Adam Kane, male   DOB: [birth year], 79 y.o.   MRN: [id number] i did talf with dr Jordan Likes about Adam Adam Kane and his family request for him to take over. Also i gave him the mri. Adam Kane aware than dr Jordan Likes will see him.

## 2013-03-03 NOTE — Progress Notes (Signed)
UR COMPLETED  

## 2013-03-03 NOTE — Consult Note (Signed)
Reason for Consult: Back and left leg pain. Referring Physician: Dr. Rush Landmark is an 79 y.o. male.  HPI: 79 year old male with severe back pain radiating into his left anterior thigh. Symptoms began following a work-related injury approximately 2 weeks ago. Patient was bending and twisting awkwardly when he had the acute onset of severe pain. The patient has been miserable with this pain over the past 10 days. The pain itself radiates into his left groin and anterior thigh and occasionally into his anterior medial leg. It is associated with some numbness as well as some weakness. He is having no bowel or bladder dysfunction. He's having no right-sided symptoms. He has never had symptoms similar to this in the past.  Workup an outside institution demonstrates evidence of a focal right-sided L4-5 extraforaminal disc herniation with marked compression of the right-sided L4 nerve root. The remainder his lumbar spine looks quite healthy.  Situation is compensated by chronic use of Coumadin as an anticoagulant for treatment of his atrial fibrillation.  Symptoms are improved following administration of IV narcotics and steroids.  Past Medical History  Diagnosis Date  . CAD (coronary artery disease)   . S/P CABG (coronary artery bypass graft) 2001  . Dyslipidemia   . Systemic hypertension   . A-fib   . Chronic kidney disease     kidney stone    Past Surgical History  Procedure Laterality Date  . Coronary artery bypass graft  2001    LIMA to LAD,SVG to PDA  . Cholecystectomy    . Hernia repair    . Prostatectomy    . Cardiac catheterization  02/20/94    Family History  Problem Relation Age of Onset  . Cancer Mother     Social History:  reports that he has quit smoking. His smoking use included Cigarettes. He smoked 0.00 packs per day. He has never used smokeless tobacco. He reports that he does not drink alcohol or use illicit drugs.  Allergies:  Allergies  Allergen  Reactions  . Dilaudid (Hydromorphone Hcl) Other (See Comments)    PASSES OUT  . Morphine And Related Other (See Comments)    PASSES OUT    Medications: I have reviewed the patient's current medications.  Results for orders placed during the hospital encounter of 03/02/13 (from the past 48 hour(s))  PROTIME-INR     Status: Abnormal   Collection Time    03/02/13  2:25 PM      Result Value Range   Prothrombin Time 25.0 (*) 11.6 - 15.2 seconds   INR 2.36 (*) 0.00 - 1.49  CBC WITH DIFFERENTIAL     Status: Abnormal   Collection Time    03/02/13  2:25 PM      Result Value Range   WBC 9.0  4.0 - 10.5 K/uL   RBC 5.62  4.22 - 5.81 MIL/uL   Hemoglobin 17.3 (*) 13.0 - 17.0 g/dL   HCT 40.9  81.1 - 91.4 %   MCV 87.9  78.0 - 100.0 fL   MCH 30.8  26.0 - 34.0 pg   MCHC 35.0  30.0 - 36.0 g/dL   RDW 78.2  95.6 - 21.3 %   Platelets 138 (*) 150 - 400 K/uL   Neutrophils Relative % 86 (*) 43 - 77 %   Neutro Abs 7.7  1.7 - 7.7 K/uL   Lymphocytes Relative 11 (*) 12 - 46 %   Lymphs Abs 1.0  0.7 - 4.0 K/uL   Monocytes Relative  4  3 - 12 %   Monocytes Absolute 0.3  0.1 - 1.0 K/uL   Eosinophils Relative 0  0 - 5 %   Eosinophils Absolute 0.0  0.0 - 0.7 K/uL   Basophils Relative 0  0 - 1 %   Basophils Absolute 0.0  0.0 - 0.1 K/uL  BASIC METABOLIC PANEL     Status: Abnormal   Collection Time    03/02/13  2:25 PM      Result Value Range   Sodium 137  135 - 145 mEq/L   Potassium 4.1  3.5 - 5.1 mEq/L   Chloride 102  96 - 112 mEq/L   CO2 31  19 - 32 mEq/L   Glucose, Bld 112 (*) 70 - 99 mg/dL   BUN 27 (*) 6 - 23 mg/dL   Creatinine, Ser 1.61  0.50 - 1.35 mg/dL   Calcium 9.4  8.4 - 09.6 mg/dL   GFR calc non Af Amer 78 (*) >90 mL/min   GFR calc Af Amer >90  >90 mL/min   Comment:            The eGFR has been calculated     using the CKD EPI equation.     This calculation has not been     validated in all clinical     situations.     eGFR's persistently     <90 mL/min signify     possible Chronic  Kidney Disease.  CBC     Status: Abnormal   Collection Time    03/03/13 10:18 AM      Result Value Range   WBC 14.7 (*) 4.0 - 10.5 K/uL   RBC 5.31  4.22 - 5.81 MIL/uL   Hemoglobin 16.3  13.0 - 17.0 g/dL   HCT 04.5  40.9 - 81.1 %   MCV 86.8  78.0 - 100.0 fL   MCH 30.7  26.0 - 34.0 pg   MCHC 35.4  30.0 - 36.0 g/dL   RDW 91.4  78.2 - 95.6 %   Platelets 149 (*) 150 - 400 K/uL  BASIC METABOLIC PANEL     Status: Abnormal   Collection Time    03/03/13 10:18 AM      Result Value Range   Sodium 135  135 - 145 mEq/L   Potassium 3.9  3.5 - 5.1 mEq/L   Chloride 101  96 - 112 mEq/L   CO2 26  19 - 32 mEq/L   Glucose, Bld 195 (*) 70 - 99 mg/dL   BUN 29 (*) 6 - 23 mg/dL   Creatinine, Ser 2.13  0.50 - 1.35 mg/dL   Calcium 9.7  8.4 - 08.6 mg/dL   GFR calc non Af Amer 81 (*) >90 mL/min   GFR calc Af Amer >90  >90 mL/min   Comment:            The eGFR has been calculated     using the CKD EPI equation.     This calculation has not been     validated in all clinical     situations.     eGFR's persistently     <90 mL/min signify     possible Chronic Kidney Disease.  PROTIME-INR     Status: None   Collection Time    03/03/13 10:18 AM      Result Value Range   Prothrombin Time 14.8  11.6 - 15.2 seconds   INR 1.19  0.00 - 1.49  No results found.  Pertinent items are noted in HPI. Blood pressure 125/80, pulse 97, temperature 98.1 F (36.7 C), temperature source Oral, resp. rate 18, height 6\' 4"  (1.93 m), weight 109.3 kg (240 lb 15.4 oz), SpO2 95.00%.  patient is awake and alert. Oriented and appropriate. Cranial nerve function is intact. Speech is fluent. Motor examination extremities reveal normal strength in his upper extremities. Lower trimming strength demonstrates 4/5 weakness of his left quadriceps with some mild wasting. Sensory examination was decreased sensation to pinprick and light touch in his left L4 dermatome. Deep tendon versus are normal active. His left patellar reflex is  absent. Straight leg raising is moderately positive on the left. Negative on the right. Examination chest and abdomen are benign. Extremities are free from injury or deformity.  Assessment/Plan: Right L4-5 extraforaminal herniated nucleus pulposus with radiculopathy. Situation complicated by Coumadin anticoagulation. I discussed situation with the patient and his family. At this point I think our options are to perform emergent surgery tonight with rapid correction of his anticoagulation versus keep him off Coumadin through the weekend and allow time for the steroids potentially lessen his symptoms to the point where surgery might not be necessary. Given the fact that he is significantly more comfortable than his initial presentation both the patient and I feel it would be most appropriate for him to remain hospitalized through the weekend for him to continue to receive IV steroids as an anti-inflammatory agents. His Coumadin should be held. Monday morning we will make a decision with regard to whether proceed with surgery.  Zula Hovsepian A 03/03/2013, 12:43 PM

## 2013-03-04 LAB — URINALYSIS, ROUTINE W REFLEX MICROSCOPIC
Leukocytes, UA: NEGATIVE
Nitrite: NEGATIVE
Protein, ur: NEGATIVE mg/dL
Urobilinogen, UA: 0.2 mg/dL (ref 0.0–1.0)

## 2013-03-04 LAB — PROTIME-INR: Prothrombin Time: 14.4 seconds (ref 11.6–15.2)

## 2013-03-04 LAB — GLUCOSE, CAPILLARY

## 2013-03-04 LAB — BASIC METABOLIC PANEL
BUN: 23 mg/dL (ref 6–23)
Calcium: 9.5 mg/dL (ref 8.4–10.5)
GFR calc Af Amer: >90 mL/min (ref >90)
GFR calc non Af Amer: 82 mL/min — ABNORMAL LOW (ref >90)
Glucose, Bld: 226 mg/dL — ABNORMAL HIGH (ref 70–99)
Sodium: 136 mEq/L (ref 135–145)

## 2013-03-04 LAB — CBC
MCH: 31.1 pg (ref 26.0–34.0)
MCV: 88.1 fL (ref 78.0–100.0)
Platelets: 161 10*3/uL (ref 150–400)
RDW: 14.1 % (ref 11.5–15.5)

## 2013-03-04 LAB — TROPONIN I: Troponin I: <0.3 ng/mL (ref <0.30)

## 2013-03-04 LAB — HEPARIN LEVEL (UNFRACTIONATED): Heparin Unfractionated: 0.46 IU/mL (ref 0.30–0.70)

## 2013-03-04 MED ORDER — DIPHENHYDRAMINE HCL 12.5 MG/5ML PO ELIX: 12.5000 mg | ORAL_SOLUTION | Freq: Four times a day (QID) | ORAL | Status: DC | PRN

## 2013-03-04 MED ORDER — NALOXONE HCL 0.4 MG/ML IJ SOLN: 0.4000 mg | INTRAMUSCULAR | Status: DC | PRN

## 2013-03-04 MED ORDER — INSULIN ASPART 100 UNIT/ML ~~LOC~~ SOLN
0.0000 [IU] | Freq: Every day | SUBCUTANEOUS | Status: DC
  Administered 2013-03-06: 3 [IU] via SUBCUTANEOUS

## 2013-03-04 MED ORDER — INSULIN ASPART 100 UNIT/ML ~~LOC~~ SOLN
0.0000 [IU] | Freq: Three times a day (TID) | SUBCUTANEOUS | Status: DC
  Administered 2013-03-05 – 2013-03-06 (×2): 2 [IU] via SUBCUTANEOUS
  Administered 2013-03-06: 3 [IU] via SUBCUTANEOUS
  Administered 2013-03-06: 2 [IU] via SUBCUTANEOUS

## 2013-03-04 MED ORDER — SODIUM CHLORIDE 0.9 % IJ SOLN: 9.0000 mL | INTRAMUSCULAR | Status: DC | PRN

## 2013-03-04 MED ORDER — DIPHENHYDRAMINE HCL 50 MG/ML IJ SOLN: 12.5000 mg | Freq: Four times a day (QID) | INTRAMUSCULAR | Status: DC | PRN

## 2013-03-04 MED ORDER — FENTANYL CITRATE 0.05 MG/ML IJ SOLN
100.0000 ug | INTRAMUSCULAR | Status: DC | PRN
  Administered 2013-03-04 – 2013-03-08 (×20): 100 ug via INTRAVENOUS
  Filled 2013-03-04 (×22): qty 2

## 2013-03-04 MED ORDER — FENTANYL 10 MCG/ML IV SOLN
INTRAVENOUS | Status: DC
  Filled 2013-03-04: qty 50

## 2013-03-04 NOTE — Progress Notes (Signed)
Patient ID: Adam Kane, male   DOB: [birth year], 79 y.o.   MRN: [id number] Still having quite a bit of pain while he is oob. To or 03/07/13 by dr Jordan Likes

## 2013-03-04 NOTE — Progress Notes (Signed)
ANTICOAGULATION CONSULT NOTE - Follow Up Consult  Pharmacy Consult for Heparin Indication: atrial fibrillation  Allergies  Allergen Reactions  . Dilaudid (Hydromorphone Hcl) Other (See Comments)    PASSES OUT  . Morphine And Related Other (See Comments)    PASSES OUT    Patient Measurements: Height: 6\' 4"  (193 cm) Weight: 240 lb 15.4 oz (109.3 kg) IBW/kg (Calculated) : 86.8 Heparin Dosing Weight: 109 kg  Vital Signs: Temp: 97.4 F (36.3 C) (07/04 1735) Temp src: Oral (07/04 1735) BP: 132/76 mmHg (07/04 1735) Pulse Rate: 79 (07/04 1735)  Labs:  Recent Labs  03/02/13 1425 03/03/13 1018 03/04/13 1235 03/04/13 1424 03/04/13 1926  HGB 17.3* 16.3 16.2  --   --   HCT 49.4 46.1 45.9  --   --   PLT 138* 149* 161  --   --   LABPROT 25.0* 14.8 14.4  --   --   INR 2.36* 1.19 1.14  --   --   HEPARINUNFRC  --   --  0.82*  --  0.46  CREATININE 0.96 0.88 0.84  --   --   TROPONINI  --   --   --  <0.30  --     Estimated Creatinine Clearance: 99.8 ml/min (by C-G formula based on Cr of 0.84).   Medications:  Infusions:  . sodium chloride 20 mL/hr (03/04/13 1510)  . heparin 1,300 Units/hr (03/04/13 1351)    Assessment: 79 year old male admitted with severe low back pain and scheduled for surgery on 7/7.  He is on chronic anticoagulation with Coumadin for atrial fibrillation which has been reversed in anticipation of surgery.  He is being bridged with Heparin.  Heparin level therapeutic.  Goal of Therapy:  Heparin level 0.3-0.7 units/ml Monitor platelets by anticoagulation protocol: Yes   Plan:  Cont Heparin at 1300 units/hr Check am labs  [place], [place].D. Clinical Pharmacist Phone: [phone] Pager: [phone] 03/04/2013, 8:09 PM

## 2013-03-04 NOTE — Progress Notes (Addendum)
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 Continue pain control. Trying when necessary fentanyl. Patient has issues morphine and Dilaudid. Offered PCA but he declined. Holding Coumadin. See below.    S/P CABG (coronary artery bypass graft): Stable medical issue.    Chronic a-fib-rate controlled not a pain is under control. Given concerns for tachycardia with pain, we will start on heparin per pharmacy which can be stopped night before possible surgery    HTN (hypertension): Better as long as pain is under control.    Leukocytosis, unspecified: White blood cell count increased. No fever. Could be stress margination. Awaiting today's labs.  Addendum: White blood cell count came back slightly higher. No fever. No signs of infection. Urinalysis ordered and was unremarkable. Troponin checked and it was normal. We'll continue to monitor and repeat labs tomorrow. Noted elevated blood sugar with no mention of history of diabetes. We'll check A1c and put patient on sliding scale. The patient is on steroids which could be causing some hyperglycemia without diabetes we'll continue to follow.  Code Status: Full code Family Communication: D/w daughter by phone  Disposition Plan: Depending on surgery, home versus skilled nursing   Consultants: Lelon Perla, neurosurgery  Procedures:  None  Antibiotics:  None  HPI/Subjective: Doing ok, pain managed by prn fentanyl. No complaints-declined fentanyl PCA  Objective: Filed Vitals:   03/03/13 2237 03/04/13 0135 03/04/13 0544 03/04/13 1002  BP: 144/90 126/90 129/82 104/60  Pulse: 90 82 87 94  Temp: 97.6 F (36.4 C) 97.5 F (36.4 C) 97.3 F (36.3 C) 97.8 F (36.6  C)  TempSrc: Oral Oral Oral Oral  Resp: 18 18 18 16   Height:      Weight:      SpO2: 96% 94% 93% 96%    Intake/Output Summary (Last 24 hours) at 03/04/13 1044 Last data filed at 03/04/13 0200  Gross per 24 hour  Intake    240 ml  Output    900 ml  Net   -660 ml   Filed Weights   03/02/13 2100 03/03/13 0537  Weight: 108.954 kg (240 lb 3.2 oz) 109.3 kg (240 lb 15.4 oz)    Exam:   General:  Alert and oriented x3, no acute distress  Cardiovascular: Irregular rhythm, rate controlled  Respiratory: Clear to auscultation bilaterally  Abdomen: Soft, nontender, nondistended, positive bowel sounds  Musculoskeletal: No clubbing or cyanosis or edema   Data Reviewed: Basic Metabolic Panel:  Recent Labs Lab 03/02/13 1425 03/03/13 1018  NA 137 135  K 4.1 3.9  CL 102 101  CO2 31 26  GLUCOSE 112* 195*  BUN 27* 29*  CREATININE 0.96 0.88  CALCIUM 9.4 9.7   CBC:  Recent Labs Lab 03/02/13 1425 03/03/13 1018  WBC 9.0 14.7*  NEUTROABS 7.7  --   HGB 17.3* 16.3  HCT 49.4 46.1  MCV 87.9 86.8  PLT 138* 149*     Studies: No results found.  Scheduled Meds: . atorvastatin  40 mg Oral QHS  . carvedilol  12.5 mg Oral BID WC  . dexamethasone  5 mg Intravenous Q6H  . diltiazem  240 mg Oral q morning - 10a  . ezetimibe  10 mg Oral q morning - 10a  . fentaNYL   Intravenous Q4H  .  nabumetone  500 mg Oral QPM  . pantoprazole  40 mg Oral Daily  . senna  1 tablet Oral Daily   Continuous Infusions: . sodium chloride 20 mL/hr at 03/04/13 1035  . 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 If 7PM-7AM, please contact night-coverage at [email or web address], password [facility] 03/04/2013, 10:44 AM  LOS: 2  days

## 2013-03-04 NOTE — Progress Notes (Signed)
ANTICOAGULATION CONSULT NOTE - Follow Up Consult  Pharmacy Consult for Heparin Indication: atrial fibrillation  Allergies  Allergen Reactions  . Dilaudid (Hydromorphone Hcl) Other (See Comments)    PASSES OUT  . Morphine And Related Other (See Comments)    PASSES OUT    Patient Measurements: Height: 6\' 4"  (193 cm) Weight: 240 lb 15.4 oz (109.3 kg) IBW/kg (Calculated) : 86.8 Heparin Dosing Weight: 109 kg  Vital Signs: Temp: 97.8 F (36.6 C) (07/04 1002) Temp src: Oral (07/04 1002) BP: 104/60 mmHg (07/04 1002) Pulse Rate: 94 (07/04 1002)  Labs:  Recent Labs  03/02/13 1425 03/03/13 1018 03/04/13 1235  HGB 17.3* 16.3 16.2  HCT 49.4 46.1 45.9  PLT 138* 149* 161  LABPROT 25.0* 14.8 14.4  INR 2.36* 1.19 1.14  HEPARINUNFRC  --   --  0.82*  CREATININE 0.96 0.88  --     Estimated Creatinine Clearance: 95.3 ml/min (by C-G formula based on Cr of 0.88).   Medications:  Infusions:  . sodium chloride 20 mL/hr at 03/04/13 1035  . heparin 1,500 Units/hr (03/04/13 1034)    Assessment: 79 year old male admitted with severe low back pain and scheduled for surgery on 7/7.  He is on chronic anticoagulation with Coumadin for atrial fibrillation which has been reversed in anticipation of surgery.  He is being bridged with Heparin and his initial heparin level is slightly supratherapeutic.  Goal of Therapy:  Heparin level 0.3-0.7 units/ml Monitor platelets by anticoagulation protocol: Yes   Plan:  Decrease Heparin to 1300 units/hr Check heparin level in 6 hours  Estella Husk, Pharm.D., BCPS Clinical Pharmacist Phone: [phone] or [phone] Pager: [phone] 03/04/2013, 1:11 PM

## 2013-03-05 LAB — CBC
HCT: 45.3 % (ref 39.0–52.0)
Hemoglobin: 15.3 g/dL (ref 13.0–17.0)
WBC: 12 10*3/uL — ABNORMAL HIGH (ref 4.0–10.5)

## 2013-03-05 LAB — PROTIME-INR
INR: 1.33 (ref 0.00–1.49)
Prothrombin Time: 16.2 seconds — ABNORMAL HIGH (ref 11.6–15.2)

## 2013-03-05 LAB — GLUCOSE, CAPILLARY: Glucose-Capillary: 156 mg/dL — ABNORMAL HIGH (ref 70–99)

## 2013-03-05 LAB — HEPARIN LEVEL (UNFRACTIONATED): Heparin Unfractionated: 0.63 IU/mL (ref 0.30–0.70)

## 2013-03-05 NOTE — Progress Notes (Signed)
ANTICOAGULATION CONSULT NOTE - Follow Up Consult  Pharmacy Consult for Heparin Indication: atrial fibrillation  Allergies  Allergen Reactions  . Dilaudid (Hydromorphone Hcl) Other (See Comments)    PASSES OUT  . Morphine And Related Other (See Comments)    PASSES OUT    Patient Measurements: Height: 6\' 4"  (193 cm) Weight: 240 lb 15.4 oz (109.3 kg) IBW/kg (Calculated) : 86.8 Heparin Dosing Weight: 109 kg  Vital Signs: Temp: 97.8 F (36.6 C) (07/05 0534) Temp src: Oral (07/05 0534) BP: 119/71 mmHg (07/05 0534) Pulse Rate: 78 (07/05 0534)  Labs:  Recent Labs  03/02/13 1425 03/03/13 1018 03/04/13 1235 03/04/13 1424 03/04/13 1926 03/05/13 0555  HGB 17.3* 16.3 16.2  --   --  15.3  HCT 49.4 46.1 45.9  --   --  45.3  PLT 138* 149* 161  --   --  156  LABPROT 25.0* 14.8 14.4  --   --  16.2*  INR 2.36* 1.19 1.14  --   --  1.33  HEPARINUNFRC  --   --  0.82*  --  0.46 0.63  CREATININE 0.96 0.88 0.84  --   --   --   TROPONINI  --   --   --  <0.30  --   --     Estimated Creatinine Clearance: 99.8 ml/min (by C-G formula based on Cr of 0.84).   Medications:  Infusions:  . sodium chloride 20 mL/hr (03/04/13 1510)  . heparin 1,300 Units/hr (03/05/13 8119)    Assessment: 79 year old male admitted with severe low back pain and scheduled for surgery on 7/7.  He is on chronic anticoagulation with Coumadin for atrial fibrillation which has been reversed in anticipation of surgery.  He is being bridged with Heparin and his heparin level remains therapeutic.  Goal of Therapy:  Heparin level 0.3-0.7 units/ml Monitor platelets by anticoagulation protocol: Yes   Plan:  Continue Heparin at 1300 units/hr Daily Heparin level and CBC  Estella Husk, Pharm.D., BCPS, AAHIVP Clinical Pharmacist Phone: [phone] or [phone] 03/05/2013, 10:29 AM

## 2013-03-05 NOTE — Progress Notes (Signed)
Patient ID: Adam Kane, male   DOB: 1932/10/18, 79 y.o.   MRN: [id number] Having a lot of pain while oob. Gets relief with bed rest and flexing legs . To or as per dr Jordan Likes.

## 2013-03-05 NOTE — Progress Notes (Signed)
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 Pain controlled as long as he is laying in bed, but severe when he tries to sit up or stand. Off of Coumadin. Neuro surgery deciding about going to a war on Monday.    S/P CABG (coronary artery bypass graft): Stable medical issue.    Chronic a-fib-rate controlled not a pain is under control. Given concerns for tachycardia with pain, we will start on heparin per pharmacy which can be stopped night before possible surgery    HTN (hypertension): Better as long as pain is under control.    Leukocytosis, unspecified: White blood cell count increased. No fever.  No signs of infection. Urinalysis ordered and was unremarkable. Troponin checked and it was normal. Back down today. Suspect secondary to steroids and stress margination.  Noted elevated blood sugar with no mention of history of diabetes. A1c ordered and pending. The patient is on steroids which could be causing some hyperglycemia without diabetes we'll continue to follow.  Code Status: Full code Family Communication: D/w daughter by phone  Disposition Plan: Depending on surgery, home versus skilled nursing   Consultants: Lelon Perla, neurosurgery  Procedures:  None  Antibiotics:  None  HPI/Subjective: Patient doing okay. No shortness of breath or palpitations. Pain managed as long as he is laying in bed. Sitting up or standing causes problems  Objective: Filed Vitals:   03/04/13 1735 03/04/13 2132 03/05/13 0132 03/05/13 0534  BP: 132/76 112/59 125/79 119/71  Pulse: 79 81 90 78  Temp: 97.4 F (36.3 C) 97.7 F (36.5 C) 98.1 F (36.7 C) 97.8 F (36.6 C)  TempSrc: Oral Oral Oral Oral  Resp: 20 20 20 20   Height:      Weight:      SpO2: 98% 95% 95% 94%    Intake/Output  Summary (Last 24 hours) at 03/05/13 1004 Last data filed at 03/05/13 0745  Gross per 24 hour  Intake      0 ml  Output    750 ml  Net   -750 ml   Filed Weights   03/02/13 2100 03/03/13 0537  Weight: 108.954 kg (240 lb 3.2 oz) 109.3 kg (240 lb 15.4 oz)    Exam:   General:  Alert and oriented x3, no acute distress  Cardiovascular: Irregular rhythm, rate controlled  Respiratory: Clear to auscultation bilaterally  Abdomen: Soft, nontender, nondistended, positive bowel sounds  Musculoskeletal: No clubbing or cyanosis or edema   Data Reviewed: Basic Metabolic Panel:  Recent Labs Lab 03/02/13 1425 03/03/13 1018 03/04/13 1235  NA 137 135 136  K 4.1 3.9 4.5  CL 102 101 102  CO2 31 26 25   GLUCOSE 112* 195* 226*  BUN 27* 29* 23  CREATININE 0.96 0.88 0.84  CALCIUM 9.4 9.7 9.5   CBC:  Recent Labs Lab 03/02/13 1425 03/03/13 1018 03/04/13 1235 03/05/13 0555  WBC 9.0 14.7* 16.8* 12.0*  NEUTROABS 7.7  --   --   --   HGB 17.3* 16.3 16.2 15.3  HCT 49.4 46.1 45.9 45.3  MCV 87.9 86.8 88.1 88.6  PLT 138* 149* 161 156     Studies: No results found.  Scheduled Meds: . atorvastatin  40 mg Oral QHS  . carvedilol  12.5 mg Oral BID WC  . dexamethasone  5 mg Intravenous Q6H  . diltiazem  240 mg Oral  q morning - 10a  . ezetimibe  10 mg Oral q morning - 10a  . insulin aspart  0-5 Units Subcutaneous QHS  . insulin aspart  0-9 Units Subcutaneous TID WC  . nabumetone  500 mg Oral QPM  . pantoprazole  40 mg Oral Daily  . senna  1 tablet Oral Daily   Continuous Infusions: . sodium chloride 20 mL/hr (03/04/13 1510)  . 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 If 7PM-7AM, please contact  night-coverage at [email or web address], password [facility] 03/05/2013, 10:04 AM  LOS: 3 days

## 2013-03-06 DIAGNOSIS — M519 Unspecified thoracic, thoracolumbar and lumbosacral intervertebral disc disorder: Secondary | ICD-10-CM

## 2013-03-06 LAB — CBC
Hemoglobin: 15.1 g/dL (ref 13.0–17.0)
MCH: 30 pg (ref 26.0–34.0)
MCV: 87.9 fL (ref 78.0–100.0)
Platelets: 156 10*3/uL (ref 150–400)
RBC: 5.03 MIL/uL (ref 4.22–5.81)

## 2013-03-06 LAB — HEMOGLOBIN A1C
Hgb A1c MFr Bld: 5.6 % (ref <5.7)
Mean Plasma Glucose: 114 mg/dL (ref <117)

## 2013-03-06 LAB — PROTIME-INR
INR: 1.32 (ref 0.00–1.49)
Prothrombin Time: 16.1 seconds — ABNORMAL HIGH (ref 11.6–15.2)

## 2013-03-06 MED ORDER — CEFAZOLIN SODIUM-DEXTROSE 2-3 GM-% IV SOLR
2.0000 g | INTRAVENOUS | Status: AC
  Administered 2013-03-07: 2 g via INTRAVENOUS
  Filled 2013-03-06 (×2): qty 50

## 2013-03-06 NOTE — Progress Notes (Signed)
Filed Vitals:   03/05/13 1431 03/05/13 1800 03/05/13 2200 03/06/13 0600  BP: 126/68 117/62 110/57 137/82  Pulse: 94 84 55 87  Temp: 98.2 F (36.8 C) 97.8 F (36.6 C) 97.8 F (36.6 C) 97.8 F (36.6 C)  TempSrc: Oral Oral    Resp: 19 18 18 18   Height:      Weight:      SpO2: 97% 95% 96% 99%    CBC  Recent Labs  03/05/13 0555 03/06/13 0452  WBC 12.0* 11.1*  HGB 15.3 15.1  HCT 45.3 44.2  PLT 156 156   BMET  Recent Labs  03/03/13 1018 03/04/13 1235  NA 135 136  K 3.9 4.5  CL 101 102  CO2 26 25  GLUCOSE 195* 226*  BUN 29* 23  CREATININE 0.88 0.84  CALCIUM 9.7 9.5    Patient resting in bed. He explains that he has had fairly good relief of his pain when laying in bed, but disabling radicular pain with standing even very briefly. Continues on heparin drip (off Coumadin). To be reevaluated in a.m. by Dr. Jordan Likes, will make n.p.o. at 0600 in anticipation of possible surgery tomorrow.  Plan: Continue current supportive care.  Hewitt Shorts, MD 03/06/2013, 9:46 AM

## 2013-03-06 NOTE — Progress Notes (Signed)
ANTICOAGULATION CONSULT NOTE - Follow Up Consult  Pharmacy Consult for Heparin Indication: atrial fibrillation  Allergies  Allergen Reactions  . Dilaudid (Hydromorphone Hcl) Other (See Comments)    PASSES OUT  . Morphine And Related Other (See Comments)    PASSES OUT    Patient Measurements: Height: 6\' 4"  (193 cm) Weight: 240 lb 15.4 oz (109.3 kg) IBW/kg (Calculated) : 86.8 Heparin Dosing Weight: 109 kg  Vital Signs: Temp: 97.9 F (36.6 C) (07/06 1110) Temp src: Oral (07/06 1110) BP: 111/71 mmHg (07/06 1110) Pulse Rate: 126 (07/06 1110)  Labs:  Recent Labs  03/04/13 1235 03/04/13 1424 03/04/13 1926 03/05/13 0555 03/06/13 0452  HGB 16.2  --   --  15.3 15.1  HCT 45.9  --   --  45.3 44.2  PLT 161  --   --  156 156  LABPROT 14.4  --   --  16.2* 16.1*  INR 1.14  --   --  1.33 1.32  HEPARINUNFRC 0.82*  --  0.46 0.63 0.42  CREATININE 0.84  --   --   --   --   TROPONINI  --  <0.30  --   --   --     Estimated Creatinine Clearance: 99.8 ml/min (by C-G formula based on Cr of 0.84).   Medications:  Infusions:  . sodium chloride 20 mL/hr (03/04/13 1510)  . heparin 1,300 Units/hr (03/06/13 0135)    Assessment: 79 year old male admitted with severe low back pain and scheduled for surgery on 7/7.  He is on chronic anticoagulation with Coumadin for atrial fibrillation which has been reversed in anticipation of surgery.  He is being bridged with Heparin and his heparin level remains therapeutic.  Note Heparin will be stopped at 0200 on 7/7 for surgery.  Goal of Therapy:  Heparin level 0.3-0.7 units/ml Monitor platelets by anticoagulation protocol: Yes   Plan:  Continue Heparin at 1300 units/hr Follow-up after surgery 7/7 for anticoagulation plans  Estella Husk, Pharm.D., BCPS, AAHIVP Clinical Pharmacist Phone: [phone] or [phone] 03/06/2013, 11:19 AM

## 2013-03-06 NOTE — Progress Notes (Signed)
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 Pain controlled as long as he is laying in bed, but severe when he tries to sit up or stand. Off of Coumadin. Neuro surgery deciding about going to a war on Monday. We'll make n.p.o. after midnight and order heparin to be stopped at 2 AM.    S/P CABG (coronary artery bypass graft): Stable medical issue.    Chronic a-fib-rate controlled not a pain is under control. Given concerns for tachycardia with pain, we will start on heparin per pharmacy which can be stopped night before possible surgery    HTN (hypertension): Better as long as pain is under control.    Leukocytosis, unspecified: White blood cell count increased. No fever.  No signs of infection. Urinalysis ordered and was unremarkable. Troponin checked and it was normal. Back to normal.. Suspect secondary to steroids and stress margination.  Noted elevated blood sugar with no mention of history of diabetes. A1c ordered and pending. The patient is on steroids which could be causing some hyperglycemia without diabetes we'll continue to follow.  Code Status: Full code Family Communication: Discussed with daughter and person yesterday  Disposition Plan: Depending on surgery, home versus skilled nursing   Consultants: Lelon Perla, neurosurgery  Procedures:  None  Antibiotics:  None  HPI/Subjective: Patient doing okay. About the same No shortness of breath or palpitations. Pain managed as long as he is laying in bed. Sitting up or standing causes pain  Objective: Filed Vitals:   03/05/13 1431 03/05/13 1800 03/05/13 2200 03/06/13 0600  BP: 126/68 117/62 110/57 137/82  Pulse: 94 84 55 87  Temp: 98.2 F (36.8 C) 97.8 F (36.6 C) 97.8 F (36.6 C) 97.8 F (36.6 C)  TempSrc: Oral Oral     Resp: 19 18 18 18   Height:      Weight:      SpO2: 97% 95% 96% 99%    Intake/Output Summary (Last 24 hours) at 03/06/13 0951 Last data filed at 03/06/13 0117  Gross per 24 hour  Intake    360 ml  Output   1550 ml  Net  -1190 ml   Filed Weights   03/02/13 2100 03/03/13 0537  Weight: 108.954 kg (240 lb 3.2 oz) 109.3 kg (240 lb 15.4 oz)    Exam:   General:  Alert and oriented x3, no acute distress  Cardiovascular: Irregular rhythm, rate controlled  Respiratory: Clear to auscultation bilaterally  Abdomen: Soft, nontender, nondistended, positive bowel sounds  Musculoskeletal: No clubbing or cyanosis or edema   Data Reviewed: Basic Metabolic Panel:  Recent Labs Lab 03/02/13 1425 03/03/13 1018 03/04/13 1235  NA 137 135 136  K 4.1 3.9 4.5  CL 102 101 102  CO2 31 26 25   GLUCOSE 112* 195* 226*  BUN 27* 29* 23  CREATININE 0.96 0.88 0.84  CALCIUM 9.4 9.7 9.5   CBC:  Recent Labs Lab 03/02/13 1425 03/03/13 1018 03/04/13 1235 03/05/13 0555 03/06/13 0452  WBC 9.0 14.7* 16.8* 12.0* 11.1*  NEUTROABS 7.7  --   --   --   --   HGB 17.3* 16.3 16.2 15.3 15.1  HCT 49.4 46.1 45.9 45.3 44.2  MCV 87.9 86.8 88.1 88.6 87.9  PLT 138* 149* 161 156 156     Studies: No results found.  Scheduled Meds: . atorvastatin  40 mg Oral  QHS  . carvedilol  12.5 mg Oral BID WC  .  ceFAZolin (ANCEF) IV  2 g Intravenous 30 min Pre-Op  . dexamethasone  5 mg Intravenous Q6H  . diltiazem  240 mg Oral q morning - 10a  . ezetimibe  10 mg Oral q morning - 10a  . insulin aspart  0-5 Units Subcutaneous QHS  . insulin aspart  0-9 Units Subcutaneous TID WC  . nabumetone  500 mg Oral QPM  . pantoprazole  40 mg Oral Daily  . senna  1 tablet Oral Daily   Continuous Infusions: . sodium chloride 20 mL/hr (03/04/13 1510)  . 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 If 7PM-7AM, please contact night-coverage at [email or web address], password [facility] 03/06/2013, 9:51 AM  LOS: 4 days

## 2013-03-07 ENCOUNTER — Inpatient Hospital Stay (HOSPITAL_COMMUNITY): Payer: Worker's Compensation

## 2013-03-07 ENCOUNTER — Encounter (HOSPITAL_COMMUNITY): Payer: Self-pay | Admitting: Anesthesiology

## 2013-03-07 ENCOUNTER — Inpatient Hospital Stay (HOSPITAL_COMMUNITY): Payer: Worker's Compensation | Admitting: Anesthesiology

## 2013-03-07 ENCOUNTER — Encounter (HOSPITAL_COMMUNITY)
Admission: EM | Disposition: A | Discharge status: Home / Self Care | Payer: Self-pay | Source: Home / Self Care | Attending: Internal Medicine

## 2013-03-07 HISTORY — PX: LUMBAR LAMINECTOMY/DECOMPRESSION MICRODISCECTOMY: SHX5026

## 2013-03-07 LAB — CBC
HCT: 44.3 % (ref 39.0–52.0)
MCV: 86.5 fL (ref 78.0–100.0)
RBC: 5.12 MIL/uL (ref 4.22–5.81)
WBC: 13.2 10*3/uL — ABNORMAL HIGH (ref 4.0–10.5)

## 2013-03-07 LAB — MRSA PCR SCREENING: MRSA by PCR: NEGATIVE

## 2013-03-07 SURGERY — LUMBAR LAMINECTOMY/DECOMPRESSION MICRODISCECTOMY 1 LEVEL
Anesthesia: General | Site: Back | Laterality: Left | Wound class: Clean

## 2013-03-07 MED ORDER — CEFAZOLIN SODIUM 1-5 GM-% IV SOLN
1.0000 g | Freq: Three times a day (TID) | INTRAVENOUS | Status: AC
  Administered 2013-03-07 – 2013-03-08 (×2): 1 g via INTRAVENOUS
  Filled 2013-03-07 (×2): qty 50

## 2013-03-07 MED ORDER — ALUM & MAG HYDROXIDE-SIMETH 200-200-20 MG/5ML PO SUSP: 30.0000 mL | Freq: Four times a day (QID) | ORAL | Status: DC | PRN

## 2013-03-07 MED ORDER — POLYETHYLENE GLYCOL 3350 17 G PO PACK
17.0000 g | PACK | Freq: Every day | ORAL | Status: DC | PRN
  Filled 2013-03-07: qty 1

## 2013-03-07 MED ORDER — LACTATED RINGERS IV SOLN
INTRAVENOUS | Status: DC | PRN
  Administered 2013-03-07 (×2): via INTRAVENOUS

## 2013-03-07 MED ORDER — SODIUM CHLORIDE 0.9 % IJ SOLN: 3.0000 mL | INTRAMUSCULAR | Status: DC | PRN

## 2013-03-07 MED ORDER — NEOSTIGMINE METHYLSULFATE 1 MG/ML IJ SOLN
INTRAMUSCULAR | Status: DC | PRN
  Administered 2013-03-07: 3 mg via INTRAVENOUS

## 2013-03-07 MED ORDER — FENTANYL CITRATE 0.05 MG/ML IJ SOLN
INTRAMUSCULAR | Status: DC | PRN
  Administered 2013-03-07: 50 ug via INTRAVENOUS
  Administered 2013-03-07: 100 ug via INTRAVENOUS
  Administered 2013-03-07: 50 ug via INTRAVENOUS

## 2013-03-07 MED ORDER — ONDANSETRON HCL 4 MG/2ML IJ SOLN: 4.0000 mg | INTRAMUSCULAR | Status: DC | PRN

## 2013-03-07 MED ORDER — HYDROCODONE-ACETAMINOPHEN 5-325 MG PO TABS: 1.0000 | ORAL_TABLET | ORAL | Status: DC | PRN

## 2013-03-07 MED ORDER — OXYCODONE-ACETAMINOPHEN 5-325 MG PO TABS: 1.0000 | ORAL_TABLET | ORAL | Status: DC | PRN

## 2013-03-07 MED ORDER — CYCLOBENZAPRINE HCL 10 MG PO TABS: 10.0000 mg | ORAL_TABLET | Freq: Three times a day (TID) | ORAL | Status: DC | PRN

## 2013-03-07 MED ORDER — ACETAMINOPHEN 325 MG PO TABS: 650.0000 mg | ORAL_TABLET | ORAL | Status: DC | PRN

## 2013-03-07 MED ORDER — HYDRALAZINE HCL 20 MG/ML IJ SOLN
10.0000 mg | INTRAMUSCULAR | Status: DC | PRN
  Administered 2013-03-07: 10 mg via INTRAVENOUS

## 2013-03-07 MED ORDER — PHENOL 1.4 % MT LIQD: 1.0000 | OROMUCOSAL | Status: DC | PRN

## 2013-03-07 MED ORDER — THROMBIN 5000 UNITS EX SOLR
CUTANEOUS | Status: DC | PRN
  Administered 2013-03-07 (×2): 5000 [IU] via TOPICAL

## 2013-03-07 MED ORDER — MENTHOL 3 MG MT LOZG: 1.0000 | LOZENGE | OROMUCOSAL | Status: DC | PRN

## 2013-03-07 MED ORDER — ONDANSETRON HCL 4 MG/2ML IJ SOLN
INTRAMUSCULAR | Status: DC | PRN
  Administered 2013-03-07: 4 mg via INTRAVENOUS

## 2013-03-07 MED ORDER — SODIUM CHLORIDE 0.9 % IJ SOLN
3.0000 mL | Freq: Two times a day (BID) | INTRAMUSCULAR | Status: DC
  Administered 2013-03-07 – 2013-03-08 (×2): 3 mL via INTRAVENOUS

## 2013-03-07 MED ORDER — BISACODYL 10 MG RE SUPP: 10.0000 mg | Freq: Every day | RECTAL | Status: DC | PRN

## 2013-03-07 MED ORDER — PHENYLEPHRINE HCL 10 MG/ML IJ SOLN
INTRAMUSCULAR | Status: DC | PRN
  Administered 2013-03-07 (×11): 80 ug via INTRAVENOUS

## 2013-03-07 MED ORDER — SENNA 8.6 MG PO TABS
1.0000 | ORAL_TABLET | Freq: Two times a day (BID) | ORAL | Status: DC
  Administered 2013-03-07 – 2013-03-08 (×2): 8.6 mg via ORAL
  Filled 2013-03-07 (×2): qty 1

## 2013-03-07 MED ORDER — BUPIVACAINE HCL (PF) 0.25 % IJ SOLN
INTRAMUSCULAR | Status: DC | PRN
  Administered 2013-03-07: 18 mL

## 2013-03-07 MED ORDER — LIDOCAINE HCL (CARDIAC) 20 MG/ML IV SOLN
INTRAVENOUS | Status: DC | PRN
  Administered 2013-03-07: 100 mg via INTRAVENOUS

## 2013-03-07 MED ORDER — 0.9 % SODIUM CHLORIDE (POUR BTL) OPTIME
TOPICAL | Status: DC | PRN
  Administered 2013-03-07: 1000 mL

## 2013-03-07 MED ORDER — PROPOFOL 10 MG/ML IV BOLUS
INTRAVENOUS | Status: DC | PRN
  Administered 2013-03-07: 200 mg via INTRAVENOUS

## 2013-03-07 MED ORDER — KETOROLAC TROMETHAMINE 30 MG/ML IJ SOLN
30.0000 mg | Freq: Four times a day (QID) | INTRAMUSCULAR | Status: DC
  Administered 2013-03-07 – 2013-03-08 (×4): 30 mg via INTRAVENOUS
  Filled 2013-03-07 (×8): qty 1

## 2013-03-07 MED ORDER — SODIUM CHLORIDE 0.9 % IV SOLN: 250.0000 mL | INTRAVENOUS | Status: DC

## 2013-03-07 MED ORDER — ROCURONIUM BROMIDE 100 MG/10ML IV SOLN
INTRAVENOUS | Status: DC | PRN
  Administered 2013-03-07: 50 mg via INTRAVENOUS
  Administered 2013-03-07: 10 mg via INTRAVENOUS

## 2013-03-07 MED ORDER — ARTIFICIAL TEARS OP OINT
TOPICAL_OINTMENT | OPHTHALMIC | Status: DC | PRN
  Administered 2013-03-07: 1 via OPHTHALMIC

## 2013-03-07 MED ORDER — KETOROLAC TROMETHAMINE 30 MG/ML IJ SOLN
INTRAMUSCULAR | Status: DC | PRN
  Administered 2013-03-07: 30 mg via INTRAVENOUS

## 2013-03-07 MED ORDER — FLEET ENEMA 7-19 GM/118ML RE ENEM: 1.0000 | ENEMA | Freq: Once | RECTAL | Status: AC | PRN

## 2013-03-07 MED ORDER — GLYCOPYRROLATE 0.2 MG/ML IJ SOLN
INTRAMUSCULAR | Status: DC | PRN
  Administered 2013-03-07: 0.4 mg via INTRAVENOUS

## 2013-03-07 MED ORDER — SODIUM CHLORIDE 0.9 % IR SOLN
Status: DC | PRN
  Administered 2013-03-07: 10:00:00

## 2013-03-07 MED ORDER — HYDRALAZINE HCL 20 MG/ML IJ SOLN
INTRAMUSCULAR | Status: AC
  Filled 2013-03-07: qty 1

## 2013-03-07 MED ORDER — FENTANYL CITRATE 0.05 MG/ML IJ SOLN: 25.0000 ug | INTRAMUSCULAR | Status: DC | PRN

## 2013-03-07 MED ORDER — EPHEDRINE SULFATE 50 MG/ML IJ SOLN
INTRAMUSCULAR | Status: DC | PRN
  Administered 2013-03-07 (×2): 5 mg via INTRAVENOUS

## 2013-03-07 MED ORDER — ZOLPIDEM TARTRATE 5 MG PO TABS: 5.0000 mg | ORAL_TABLET | Freq: Every evening | ORAL | Status: DC | PRN

## 2013-03-07 MED ORDER — ACETAMINOPHEN 650 MG RE SUPP: 650.0000 mg | RECTAL | Status: DC | PRN

## 2013-03-07 MED ORDER — PROMETHAZINE HCL 25 MG/ML IJ SOLN
6.2500 mg | INTRAMUSCULAR | Status: DC | PRN
  Filled 2013-03-07: qty 1

## 2013-03-07 MED ORDER — HEMOSTATIC AGENTS (NO CHARGE) OPTIME
TOPICAL | Status: DC | PRN
  Administered 2013-03-07: 1 via TOPICAL

## 2013-03-07 SURGICAL SUPPLY — 46 items
BAG DECANTER FOR FLEXI CONT (MISCELLANEOUS) ×2 IMPLANT
BENZOIN TINCTURE PRP APPL 2/3 (GAUZE/BANDAGES/DRESSINGS) ×2 IMPLANT
BLADE SURG ROTATE 9660 (MISCELLANEOUS) IMPLANT
BRUSH SCRUB EZ PLAIN DRY (MISCELLANEOUS) ×2 IMPLANT
BUR CUTTER 7.0 ROUND (BURR) ×2 IMPLANT
CANISTER SUCTION 2500CC (MISCELLANEOUS) ×2 IMPLANT
CLOTH BEACON ORANGE TIMEOUT ST (SAFETY) ×2 IMPLANT
CONT SPEC 4OZ CLIKSEAL STRL BL (MISCELLANEOUS) ×2 IMPLANT
DECANTER SPIKE VIAL GLASS SM (MISCELLANEOUS) ×2 IMPLANT
DERMABOND ADVANCED (GAUZE/BANDAGES/DRESSINGS) ×1
DERMABOND ADVANCED .7 DNX12 (GAUZE/BANDAGES/DRESSINGS) ×1 IMPLANT
DRAPE LAPAROTOMY 100X72X124 (DRAPES) ×2 IMPLANT
DRAPE MICROSCOPE ZEISS OPMI (DRAPES) ×2 IMPLANT
DRAPE POUCH INSTRU U-SHP 10X18 (DRAPES) ×2 IMPLANT
DRAPE PROXIMA HALF (DRAPES) IMPLANT
DRAPE SURG 17X23 STRL (DRAPES) ×4 IMPLANT
DURAPREP 26ML APPLICATOR (WOUND CARE) ×2 IMPLANT
ELECT REM PT RETURN 9FT ADLT (ELECTROSURGICAL) ×2
ELECTRODE REM PT RTRN 9FT ADLT (ELECTROSURGICAL) ×1 IMPLANT
GAUZE SPONGE 4X4 16PLY XRAY LF (GAUZE/BANDAGES/DRESSINGS) IMPLANT
GLOVE ECLIPSE 8.5 STRL (GLOVE) ×2 IMPLANT
GLOVE EXAM NITRILE LRG STRL (GLOVE) IMPLANT
GLOVE EXAM NITRILE MD LF STRL (GLOVE) IMPLANT
GLOVE EXAM NITRILE XL STR (GLOVE) IMPLANT
GLOVE EXAM NITRILE XS STR PU (GLOVE) IMPLANT
GOWN BRE IMP SLV AUR LG STRL (GOWN DISPOSABLE) IMPLANT
GOWN BRE IMP SLV AUR XL STRL (GOWN DISPOSABLE) ×2 IMPLANT
GOWN STRL REIN 2XL LVL4 (GOWN DISPOSABLE) IMPLANT
KIT BASIN OR (CUSTOM PROCEDURE TRAY) ×2 IMPLANT
KIT ROOM TURNOVER OR (KITS) ×2 IMPLANT
NEEDLE HYPO 22GX1.5 SAFETY (NEEDLE) ×2 IMPLANT
NEEDLE SPNL 22GX3.5 QUINCKE BK (NEEDLE) ×2 IMPLANT
NS IRRIG 1000ML POUR BTL (IV SOLUTION) ×2 IMPLANT
PACK LAMINECTOMY NEURO (CUSTOM PROCEDURE TRAY) ×2 IMPLANT
PAD ARMBOARD 7.5X6 YLW CONV (MISCELLANEOUS) ×6 IMPLANT
RUBBERBAND STERILE (MISCELLANEOUS) ×4 IMPLANT
SPONGE GAUZE 4X4 12PLY (GAUZE/BANDAGES/DRESSINGS) ×2 IMPLANT
SPONGE SURGIFOAM ABS GEL SZ50 (HEMOSTASIS) ×2 IMPLANT
STRIP CLOSURE SKIN 1/2X4 (GAUZE/BANDAGES/DRESSINGS) ×2 IMPLANT
SUT VIC AB 2-0 CT1 18 (SUTURE) ×2 IMPLANT
SUT VIC AB 3-0 SH 8-18 (SUTURE) ×2 IMPLANT
SYR 20ML ECCENTRIC (SYRINGE) ×2 IMPLANT
TAPE CLOTH SURG 4X10 WHT LF (GAUZE/BANDAGES/DRESSINGS) ×2 IMPLANT
TOWEL OR 17X24 6PK STRL BLUE (TOWEL DISPOSABLE) ×2 IMPLANT
TOWEL OR 17X26 10 PK STRL BLUE (TOWEL DISPOSABLE) ×2 IMPLANT
WATER STERILE IRR 1000ML POUR (IV SOLUTION) ×2 IMPLANT

## 2013-03-07 NOTE — Anesthesia Preprocedure Evaluation (Addendum)
Anesthesia Evaluation  Patient identified by MRN, date of birth, ID band Patient awake    Reviewed: Allergy & Precautions, H&P , NPO status , Patient's Chart, lab work & pertinent test results  Airway       Dental   Pulmonary neg pulmonary ROS, former smoker,          Cardiovascular hypertension, Pt. on medications + CAD and + CABG + dysrhythmias Atrial Fibrillation  Echo 2012: normal ef   Neuro/Psych negative psych ROS   GI/Hepatic negative GI ROS, Neg liver ROS,   Endo/Other  negative endocrine ROS  Renal/GU negative Renal ROS     Musculoskeletal   Abdominal   Peds  Hematology   Anesthesia Other Findings   Reproductive/Obstetrics                         Anesthesia Physical Anesthesia Plan  ASA: III  Anesthesia Plan: General   Post-op Pain Management:    Induction: Intravenous  Airway Management Planned: Oral ETT  Additional Equipment:   Intra-op Plan:   Post-operative Plan: Extubation in OR  Informed Consent: I have reviewed the patients History and Physical, chart, labs and discussed the procedure including the risks, benefits and alternatives for the proposed anesthesia with the patient or authorized representative who has indicated his/her understanding and acceptance.   Dental advisory given  Plan Discussed with: CRNA, Anesthesiologist and Surgeon  Anesthesia Plan Comments:         Anesthesia Quick Evaluation

## 2013-03-07 NOTE — Transfer of Care (Signed)
Immediate Anesthesia Transfer of Care Note  Patient: Adam Kane  Procedure(s) Performed: Procedure(s) with comments: LUMBAR FOUR-FIVE EXTRAFORAMINAL LUMBAR LAMINECTOMY/DECOMPRESSION MICRODISCECTOMY 1 LEVEL (Left) - Left lumbar four-five extraforaminal microdiskectomy  Patient Location: PACU  Anesthesia Type:General  Level of Consciousness: awake, alert  and oriented  Airway & Oxygen Therapy: Patient Spontanous Breathing and Patient connected to face mask oxygen  Post-op Assessment: Report given to PACU RN  Post vital signs: Reviewed and stable  Complications: No apparent anesthesia complications

## 2013-03-07 NOTE — Op Note (Signed)
Date of procedure: 03/07/2013  Date of dictation: Same  Service: Neurosurgery  Preoperative diagnosis: Left L4-5 extraforaminal herniated nucleus pulposus with radiculopathy  Postoperative diagnosis: Same  Procedure Name: Left L4-5 extraforaminal expiration with microdiscectomy  Surgeon:Satine Hausner A.Mili Piltz, M.D.  Asst. Surgeon: None  Anesthesia: General  Indication: 79 year old male with severe left lower chamois pain paresthesias and weakness consistent with a left-sided L4 radiculopathy. Workup demonstrates evidence of a large left-sided L4-5 extraforaminal disc herniation with marked compression the left L4 nerve root. Patient presents now for microdiscectomy in hopes of improving his symptoms.  Operative note: After induction of anesthesia, patient positioned prone onto Wilson frame and appropriately padded. Lumbar region prepped and draped. Incision made overlying L4-5. Supper off Henderson Newcomer performed the left side exposing the underlying lamina and facet joint. X-ray taken and approach was made to the L5-S1 level. Dissection was redirected 1 level cephalad. The L4-5 extraforaminal space was dissected free. Microscope and brought field these were microdissection of the extraforaminal space. A small aspect of the lateral margin of the superior facet of L5 was resected using high-speed drill and Kerrison rongeurs. Intertransverse ligament was elevated and resected in piecemeal fashion. Underlying L4 nerve root and large amount of disc herniation was then of encountered. Disc herniation was then dissected free and removed in several large fragments. All elements disc herniation were completely resected. The nerve root itself was free from any further compression. Wounds that area MI solution. Gelfoam was placed topically for hemostasis. Microscope and retractor system were removed. Hemostasis of the muscle achieved but Montez Morita was and close in layers with Vicryl sutures. Steri-Strips and sterile dressing  were applied. There were no apparent complications. Patient tolerated the procedure well and he returns to the recovery room postop.

## 2013-03-07 NOTE — Brief Op Note (Signed)
03/02/2013 - 03/07/2013  10:52 AM  PATIENT:  Adam Kane  79 y.o. male  PRE-OPERATIVE DIAGNOSIS:  Left Lumbar four-five extraforaminal Herniated Nucleus Pulposus with radiculopathy  POST-OPERATIVE DIAGNOSIS:  Left Lumbar four-five extraforaminal Herniated Nucleus Pulposus with   PROCEDURE:  Procedure(s) with comments: LUMBAR FOUR-FIVE EXTRAFORAMINAL LUMBAR LAMINECTOMY/DECOMPRESSION MICRODISCECTOMY 1 LEVEL (Left) - Left lumbar four-five extraforaminal microdiskectomy  SURGEON:  Surgeon(s) and Role:    * Temple Pacini, MD - Primary  PHYSICIAN ASSISTANT:   ASSISTANTS: none   ANESTHESIA:   general  EBL:  Total I/O In: 1200 [I.V.:1200] Out: 300 [Urine:300]  BLOOD ADMINISTERED:none  DRAINS: none   LOCAL MEDICATIONS USED:  MARCAINE     SPECIMEN:  No Specimen  DISPOSITION OF SPECIMEN:  N/A  COUNTS:  YES  TOURNIQUET:  * No tourniquets in log *  DICTATION: .Dragon Dictation  PLAN OF CARE: Continue inpatient hospitalization  PATIENT DISPOSITION:  PACU - hemodynamically stable.   Delay start of Pharmacological VTE agent (>24hrs) due to surgical blood loss or risk of bleeding: yes

## 2013-03-07 NOTE — Preoperative (Signed)
Beta Blockers   Reason not to administer Beta Blockers:Not Applicable 

## 2013-03-07 NOTE — Progress Notes (Signed)
Dr. Krista Blue aware of increased BP

## 2013-03-07 NOTE — Anesthesia Postprocedure Evaluation (Signed)
Anesthesia Post Note  Patient: Adam Kane  Procedure(s) Performed: Procedure(s) (LRB): LUMBAR FOUR-FIVE EXTRAFORAMINAL LUMBAR LAMINECTOMY/DECOMPRESSION MICRODISCECTOMY 1 LEVEL (Left)  Anesthesia type: general  Patient location: PACU  Post pain: Pain level controlled  Post assessment: Patient's Cardiovascular Status Stable  Last Vitals:  Filed Vitals:   03/07/13 1155  BP: 187/117  Pulse: 93  Temp: 36.4 C  Resp: 18    Post vital signs: Reviewed and stable  Level of consciousness: sedated  Complications: No apparent anesthesia complications

## 2013-03-07 NOTE — Progress Notes (Signed)
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 Pain controlled as long as he is laying in bed, but severe when he tries to sit up or stand. Off of Coumadin. Heparin stopped this morning. Patient for or this morning.    S/P CABG (coronary artery bypass graft): Stable medical issue.    Chronic a-fib-rate controlled not a pain is under control. Given concerns for tachycardia with pain, we will start on heparin per pharmacy which was stopped prior to surgery.    HTN (hypertension): Better as long as pain is under control.    Leukocytosis, unspecified: White blood cell count increased. No fever.  No signs of infection. Urinalysis ordered and was unremarkable. Troponin checked and it was normal. Back to normal.. Suspect secondary to steroids and stress margination.  Noted elevated blood sugar with no mention of history of diabetes. A1c normal.  Code Status: Full code Family Communication: Met with daughter at the bedside.  Disposition Plan: Depending on surgery, home versus skilled nursing   Consultants: Lelon Perla, neurosurgery  Procedures:  None  Antibiotics:  None  HPI/Subjective: Patient seen prior to surgery. No complaints, pain controlled with pain medication. Anxious for surgery.  Objective: Filed Vitals:   03/06/13 1900 03/06/13 2146 03/07/13 0600 03/07/13 0805  BP: 113/68 130/77 116/83 140/89  Pulse: 80 91 77 67  Temp: 98.2 F (36.8 C) 97.6 F (36.4 C) 97.5 F (36.4 C) 97.6 F (36.4 C)  TempSrc: Oral   Oral  Resp: 19 18 18 18   Height:      Weight:      SpO2: 95% 96% 95% 94%    Intake/Output Summary (Last 24 hours) at 03/07/13 0933 Last data filed at 03/07/13 0730  Gross per 24 hour  Intake    460 ml  Output    575 ml  Net   -115 ml   Filed Weights   03/02/13 2100 03/03/13 0537  Weight: 108.954 kg (240 lb 3.2 oz) 109.3 kg (240 lb 15.4 oz)    Exam:   General:  Alert and oriented x3, no acute distress  Cardiovascular: Irregular rhythm, rate controlled  Respiratory: Clear to auscultation bilaterally  Abdomen: Soft, nontender, nondistended, positive bowel sounds  Musculoskeletal: No clubbing or cyanosis or edema   Data Reviewed: Basic Metabolic Panel:  Recent Labs Lab 03/02/13 1425 03/03/13 1018 03/04/13 1235  NA 137 135 136  K 4.1 3.9 4.5  CL 102 101 102  CO2 31 26 25   GLUCOSE 112* 195* 226*  BUN 27* 29* 23  CREATININE 0.96 0.88 0.84  CALCIUM 9.4 9.7 9.5   CBC:  Recent Labs Lab 03/02/13 1425 03/03/13 1018 03/04/13 1235 03/05/13 0555 03/06/13 0452 03/07/13 0435  WBC 9.0 14.7* 16.8* 12.0* 11.1* 13.2*  NEUTROABS 7.7  --   --   --   --   --   HGB 17.3* 16.3 16.2 15.3 15.1 15.6  HCT 49.4 46.1 45.9 45.3 44.2 44.3  MCV 87.9 86.8 88.1 88.6 87.9 86.5  PLT 138* 149* 161 156 156 160     Studies: No results found.  Scheduled Meds: . atorvastatin  40 mg Oral QHS  . carvedilol  12.5 mg Oral BID WC  .  ceFAZolin (ANCEF) IV  2 g Intravenous 30 min Pre-Op  . dexamethasone  5 mg Intravenous Q6H  . diltiazem  240 mg Oral  q morning - 10a  . ezetimibe  10 mg Oral q morning - 10a  . insulin aspart  0-5 Units Subcutaneous QHS  . insulin aspart  0-9 Units Subcutaneous TID WC  . nabumetone  500 mg Oral QPM  . pantoprazole  40 mg Oral Daily  . senna  1 tablet Oral Daily   Continuous Infusions: . 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 If 7PM-7AM, please contact night-coverage at [email or web address], password [facility] 03/07/2013, 9:33 AM   LOS: 5 days

## 2013-03-08 ENCOUNTER — Encounter (HOSPITAL_COMMUNITY): Payer: Self-pay | Admitting: Neurosurgery

## 2013-03-08 LAB — GLUCOSE, CAPILLARY
Glucose-Capillary: 182 mg/dL — ABNORMAL HIGH (ref 70–99)
Glucose-Capillary: 135 mg/dL — ABNORMAL HIGH (ref 70–99)

## 2013-03-08 MED ORDER — HYDROCODONE-ACETAMINOPHEN 5-325 MG PO TABS: 1.0000 | ORAL_TABLET | ORAL | Status: DC | PRN

## 2013-03-08 MED ORDER — CYCLOBENZAPRINE HCL 10 MG PO TABS: 10.0000 mg | ORAL_TABLET | Freq: Three times a day (TID) | ORAL | Status: DC | PRN

## 2013-03-08 NOTE — Care Management Note (Signed)
    Page 1 of 1   03/08/2013     3:35:31 PM   CARE MANAGEMENT NOTE 03/08/2013  Patient:  Adam Kane, Adam Kane   Account Number:  [id number]  Date Initiated:  03/07/2013  Documentation initiated by:  Jiles Crocker  Subjective/Objective Assessment:   ADMITTED FOR Severe back pain, L4 radiculopathy from nerve root impingement, to surgery for  Left L4-5 extraforaminal expiration with microdiscectomy     Action/Plan:   LIVES AT HOME WITH SPOUSE; CM FOLLOWING FOR DCP   Anticipated DC Date:  03/14/2013   Anticipated DC Plan:  HOME W HOME HEALTH SERVICES      DC Planning Services  CM consult      Choice offered to / List presented to:             Status of service:  Completed, signed off Medicare Important Message given?  NA - LOS <3 / Initial given by admissions (If response is "NO", the following Medicare IM given date fields will be blank) Date Medicare IM given:   Date Additional Medicare IM given:    Discharge Disposition:  HOME/SELF CARE  Per UR Regulation:  Reviewed for med. necessity/level of care/duration of stay  If discussed at Long Length of Stay Meetings, dates discussed:    Comments:  03/08/13  1510 Elmer Bales RN, MSN, CM-  Spoke with worker's compensation case manager Myrna Thad Ranger [phone] regarding discharge needs.  Pt does not need any home health services or equipment.  Discharge summary was faxed to [phone] as requested.    03/07/2013- B CHANDLER RN,BSN,MHA

## 2013-03-08 NOTE — Discharge Summary (Signed)
Physician Discharge Summary  Patient ID: Adam Kane MRN: [id number] DOB/AGE: [birth year] 79 y.o.  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 Situation complicated by anticoagulation. Patient Coumadin stopped. Placed on IV heparin and coagulation parameters allowed to normalize. Patient taken the operating room where he underwent uncomplicated left-sided L4-5 extraforaminal microdiscectomy postoperative great relief in his pain. He is up and Lipitor he and her discharge home.  Consults:   Significant Diagnostic Studies:   Treatments:   Discharge Exam: Blood pressure 97/64, pulse 102, temperature 98.4 F (36.9 C), temperature source Oral, resp. rate 18, height 6\' 4"  (1.93 m), weight 109.3 kg (240 lb 15.4 oz), SpO2 97.00%. Awake and alert. Oriented and appropriate. Cranial nerve function intact. Motor and sensory function extremities normal. Wound clean and dry. Chest and abdomen benign.  Disposition: 01-Home or Self Care     Medication List         aspirin EC 81 MG tablet  Take 81 mg by mouth daily.     atorvastatin 40 MG tablet  Commonly known as:  LIPITOR  Take 40 mg by mouth at bedtime.     carvedilol 25 MG tablet  Commonly known as:  COREG  Take 12.5 mg by mouth 2 (two) times daily with a meal. Take 1/2 tablet twice daily     cholecalciferol 1000 UNITS tablet  Commonly known as:  VITAMIN D  Take 1,000 Units by mouth daily.     cyclobenzaprine 10 MG tablet  Commonly known as:  FLEXERIL  Take 1 tablet (10 mg total) by mouth 3 (three)  times daily as needed for muscle spasms.     diltiazem 240 MG 24 hr capsule  Commonly known as:  TIAZAC  Take 240 mg by mouth every morning.     ezetimibe 10 MG tablet  Commonly known as:  ZETIA  Take 10 mg by mouth every morning.     HYDROcodone-acetaminophen 5-325 MG per tablet  Commonly known as:  NORCO/VICODIN  Take 1-2 tablets by mouth every 4 (four) hours as needed.     nabumetone 500 MG tablet  Commonly known as:  RELAFEN  Take 500 mg by mouth every evening.     omega-3 acid ethyl esters 1 G capsule  Commonly known as:  LOVAZA  Take 1 g by mouth 2 (two) times daily.     omeprazole 20 MG capsule  Commonly known as:  PRILOSEC  Take 20 mg by mouth every morning.     warfarin 2 MG tablet  Commonly known as:  COUMADIN  Take 2 mg by mouth daily.           Follow-up Information   Follow up with Renda Pohlman A, MD In 1 week. (ext 212)    Contact information:   [address] [place] [address] [phone]       Signed: Julio Sicks A 03/08/2013, 2:19 PM

## 2013-03-08 NOTE — Progress Notes (Signed)
Pt discharged home with daughter, RN provided discharge instructions, pt and daughter verbalized an understanding and denied any questions or concerns

## 2013-03-08 NOTE — Progress Notes (Signed)
UR COMPLETED  

## 2013-05-27 ENCOUNTER — Encounter: Payer: Self-pay | Admitting: Cardiovascular Disease

## 2013-05-27 ENCOUNTER — Ambulatory Visit (INDEPENDENT_AMBULATORY_CARE_PROVIDER_SITE_OTHER): Payer: Medicare Other | Admitting: Cardiovascular Disease

## 2013-05-27 VITALS — BP 132/78 | HR 76 | Ht 77.0 in | Wt 243.8 lb

## 2013-05-27 DIAGNOSIS — I10 Essential (primary) hypertension: Secondary | ICD-10-CM

## 2013-05-27 DIAGNOSIS — I2510 Atherosclerotic heart disease of native coronary artery without angina pectoris: Secondary | ICD-10-CM

## 2013-05-27 DIAGNOSIS — E785 Hyperlipidemia, unspecified: Secondary | ICD-10-CM

## 2013-05-27 DIAGNOSIS — Z79899 Other long term (current) drug therapy: Secondary | ICD-10-CM

## 2013-05-27 DIAGNOSIS — Z951 Presence of aortocoronary bypass graft: Secondary | ICD-10-CM

## 2013-05-27 DIAGNOSIS — I482 Chronic atrial fibrillation, unspecified: Secondary | ICD-10-CM

## 2013-05-27 DIAGNOSIS — I4891 Unspecified atrial fibrillation: Secondary | ICD-10-CM

## 2013-05-27 NOTE — Patient Instructions (Addendum)
Your physician recommends that you schedule a follow-up appointment in: 12 months.  

## 2013-05-31 DIAGNOSIS — E785 Hyperlipidemia, unspecified: Secondary | ICD-10-CM | POA: Insufficient documentation

## 2013-05-31 NOTE — Progress Notes (Signed)
Patient ID: Adam Kane, male   DOB: 20-Oct-1932, 79 y.o.   MRN: [id number]     Reason for office visit Followup CAD, atrial fibrillation, hyperlipidemia  Adam Kane has not had any cardiac problems since we last met but has had problems with nephrolithiasis and back pain. He actually underwent lithotripsy and had some mild hematuria. Did not have any serious bleeding. He is taking Relafen for his lumbosacral pain. No history of peptic ulcer disease or gastrointestinal bleeding  He remains in atrial fibrillation and is oblivious to the arrhythmia. He denies chest pain, shortness of breath or dizziness. Has occasional mild ankle edema towards the end of the day that always resolves after overnight recumbency. No neurological problems.    Allergies  Allergen Reactions  . Dilaudid [Hydromorphone Hcl] Other (See Comments)    PASSES OUT  . Morphine And Related Other (See Comments)    PASSES OUT    Current Outpatient Prescriptions  Medication Sig Dispense Refill  . aspirin EC 81 MG tablet Take 81 mg by mouth daily.      Marland Kitchen atorvastatin (LIPITOR) 40 MG tablet Take 40 mg by mouth at bedtime.       . carvedilol (COREG) 25 MG tablet Take 12.5 mg by mouth 2 (two) times daily with a meal. Take 1/2 tablet twice daily      . cholecalciferol (VITAMIN D) 1000 UNITS tablet Take 1,000 Units by mouth daily.      Marland Kitchen diltiazem (TIAZAC) 240 MG 24 hr capsule Take 240 mg by mouth every morning.       . ezetimibe (ZETIA) 10 MG tablet Take 10 mg by mouth every morning.       . nabumetone (RELAFEN) 500 MG tablet Take 500 mg by mouth every evening.       Marland Kitchen omega-3 acid ethyl esters (LOVAZA) 1 G capsule Take 1 g by mouth 2 (two) times daily.      Marland Kitchen omeprazole (PRILOSEC) 20 MG capsule Take 20 mg by mouth every morning.       . tamsulosin (FLOMAX) 0.4 MG CAPS capsule Take 0.4 mg by mouth daily.      Marland Kitchen warfarin (COUMADIN) 2 MG tablet Take 2 mg by mouth daily.       No current facility-administered medications for  this visit.    Past Medical History  Diagnosis Date  . CAD (coronary artery disease)   . S/P CABG (coronary artery bypass graft) 2001  . Dyslipidemia   . Systemic hypertension   . A-fib   . Chronic kidney disease     kidney stone    Past Surgical History  Procedure Laterality Date  . Coronary artery bypass graft  2001    LIMA to LAD,SVG to PDA  . Cholecystectomy    . Hernia repair    . Prostatectomy    . Cardiac catheterization  02/20/94  . Lumbar laminectomy/decompression microdiscectomy Left 03/07/2013    Procedure: LUMBAR FOUR-FIVE EXTRAFORAMINAL LUMBAR LAMINECTOMY/DECOMPRESSION MICRODISCECTOMY 1 LEVEL;  Surgeon: Temple Pacini, MD;  Location: [facility];  Service: Neurosurgery;  Laterality: Left;  Left lumbar four-five extraforaminal microdiskectomy    Family History  Problem Relation Age of Onset  . Cancer Mother     History   Social History  . Marital Status: Married    Spouse Name: N/A    Number of Children: N/A  . Years of Education: N/A   Occupational History  . Not on file.   Social History Main Topics  . Smoking  status: Former Smoker    Types: Cigarettes  . Smokeless tobacco: Never Used  . Alcohol Use: No  . Drug Use: No  . Sexual Activity: Not on file   Other Topics Concern  . Not on file   Social History Narrative  . No narrative on file    Review of systems: The patient specifically denies any chest pain at rest or with exertion, dyspnea at rest or with exertion, orthopnea, paroxysmal nocturnal dyspnea, syncope, palpitations, focal neurological deficits, intermittent claudication, lower extremity edema, unexplained weight gain, cough, hemoptysis or wheezing.  The patient also denies abdominal pain, nausea, vomiting, dysphagia, diarrhea, constipation, polyuria, polydipsia, dysuria, frequency, urgency, abnormal bleeding or bruising, fever, chills, unexpected weight changes, mood swings, change in skin or hair texture, change in voice quality,  auditory or visual problems, allergic reactions or rashes, new musculoskeletal complaints other than usual "aches and pains".   PHYSICAL EXAM BP 132/78  Pulse 76  Ht 6\' 5"  (1.956 m)  Wt 243 lb 12.8 oz (110.587 kg)  BMI 28.9 kg/m2  General: Alert, oriented x3, no distress Head: no evidence of trauma, PERRL, EOMI, no exophtalmos or lid lag, no myxedema, no xanthelasma; normal ears, nose and oropharynx Neck: normal jugular venous pulsations and no hepatojugular reflux; brisk carotid pulses without delay and no carotid bruits Chest: clear to auscultation, no signs of consolidation by percussion or palpation, normal fremitus, symmetrical and full respiratory excursions, well-healed sternotomy scar Cardiovascular: normal position and quality of the apical impulse, irregular rhythm, normal first and second heart sounds, no murmurs, rubs or gallops Abdomen: no tenderness or distention, no masses by palpation, no abnormal pulsatility or arterial bruits, normal bowel sounds, no hepatosplenomegaly Extremities: no clubbing, cyanosis or edema; 2+ radial, ulnar and brachial pulses bilaterally; 2+ right femoral, posterior tibial and dorsalis pedis pulses; 2+ left femoral, posterior tibial and dorsalis pedis pulses; no subclavian or femoral bruits Neurological: grossly nonfocal   EKG: Atrial fibrillation otherwise normal tracing  Lipid Panel  No results found for this basename: chol, trig, hdl, cholhdl, vldl, ldlcalc    BMET    Component Value Date/Time   NA 136 03/04/2013 1235   K 4.5 03/04/2013 1235   CL 102 03/04/2013 1235   CO2 25 03/04/2013 1235   GLUCOSE 226* 03/04/2013 1235   BUN 23 03/04/2013 1235   CREATININE 0.84 03/04/2013 1235   CALCIUM 9.5 03/04/2013 1235   GFRNONAA 82* 03/04/2013 1235   GFRAA >90 03/04/2013 1235     ASSESSMENT AND PLAN Chronic a-fib Rate control is good; on therapeutic warfarin anticoagulation. History of multiple previous attempts at cardioversion with recurrent AF  HTN  (hypertension) Good control, no change in medications.  Hyperlipidemia On combination statin and Zetia. Labs performed in Dr. Dell Ponto are reportedly within the desirable range, we'll obtain a copy.  Orders Placed This Encounter  Procedures  . Comp Met (CMET)  . Lipid Profile  . EKG 12-Lead   Meds ordered this encounter  Medications  . tamsulosin (FLOMAX) 0.4 MG CAPS capsule    Sig: Take 0.4 mg by mouth daily.    Junious Silk, MD, [facility] [phone] office [phone] pager

## 2013-05-31 NOTE — Assessment & Plan Note (Signed)
Good control, no change in medications.

## 2013-05-31 NOTE — Assessment & Plan Note (Addendum)
On combination statin and Zetia. Labs performed in Dr. Laurey Morale office are reportedly within the desirable range, we'll obtain a copy.

## 2013-05-31 NOTE — Assessment & Plan Note (Signed)
Rate control is good; on therapeutic warfarin anticoagulation. History of multiple previous attempts at cardioversion with recurrent AF

## 2013-06-13 ENCOUNTER — Encounter: Payer: Self-pay | Admitting: Cardiovascular Disease

## 2013-07-22 ENCOUNTER — Other Ambulatory Visit: Payer: Self-pay | Admitting: Internal Medicine

## 2013-07-22 ENCOUNTER — Ambulatory Visit
Admission: RE | Admit: 2013-07-22 | Discharge: 2013-07-22 | Disposition: A | Discharge status: Home / Self Care | Payer: Medicare Other | Source: Ambulatory Visit | Attending: Internal Medicine | Admitting: Internal Medicine

## 2013-07-22 DIAGNOSIS — R9389 Abnormal findings on diagnostic imaging of other specified body structures: Secondary | ICD-10-CM

## 2014-05-16 ENCOUNTER — Other Ambulatory Visit: Payer: Self-pay | Admitting: Urology

## 2014-05-16 ENCOUNTER — Encounter (HOSPITAL_COMMUNITY): Payer: Self-pay | Admitting: Pharmacy Technician

## 2014-05-16 NOTE — Progress Notes (Signed)
Called [facility] spoke with Chastity and made her aware orders need to be signed by Dr Alinda Money.

## 2014-05-16 NOTE — Patient Instructions (Addendum)
Adam Kane  05/16/2014   Your procedure is scheduled on:     05/18/2014   Report to [facility] Main Entrance and follow signs to  [place] at AM. Arrive at 0900 AM  Call this number if you have problems the morning of surgery [phone] or Presurgical Testing [phone].   Remember:  Do not eat food or drink liquids :After Midnight.  For Living Will and/or Health Care Power Attorney Forms: please provide copy for your medical record, may bring AM of surgery (forms should be already notarized-we do not provide this service).   Take these medicines the morning of surgery with A SIP OF WATER: Cephalexin,Carvedilol ( Coreg),Diltiazem,Omeprazole; Use Coumadin and Aspirin per instruction of MD.                               You may not have any metal on your body including hair pins and piercings  Do not wear jewelry, make-up, lotions, powders, or deodorant.  Do not shave body hair  48 hours(2 days) of CHG soap use. Men may shave face and neck.               Do not bring valuables to the hospital. [place].  Contacts, dentures or bridgework may not be worn into surgery.    Patients discharged the day of surgery will not be allowed to drive home.  Name and phone number of your driver:Adam Kane - Daughter [phone]. ________________________________________________________________________    [facility] - Preparing for Surgery Before surgery, you can play an important role.  Because skin is not sterile, your skin needs to be as free of germs as possible.  You can reduce the number of germs on your skin by washing with CHG (chlorahexidine gluconate) soap before surgery.  CHG is an antiseptic cleaner which kills germs and bonds with the skin to continue killing germs even after washing. Please DO NOT use if you have an allergy to CHG or antibacterial soaps.  If your skin becomes reddened/irritated stop using the CHG and inform  your nurse when you arrive at Short Stay. Do not shave (including legs and underarms) for at least 48 hours prior to the first CHG shower.  You may shave your face/neck. Please follow these instructions carefully:  1.  Shower with CHG Soap the night before surgery and the  morning of Surgery.  2.  If you choose to wash your hair, wash your hair first as usual with your  normal  shampoo.  3.  After you shampoo, rinse your hair and body thoroughly to remove the  shampoo.                           4.  Use CHG as you would any other liquid soap.  You can apply chg directly  to the skin and wash                       Gently with a scrungie or clean washcloth.  5.  Apply the CHG Soap to your body ONLY FROM THE NECK DOWN.   Do not use on face/ open                           Wound or open sores. Avoid contact with eyes,  ears mouth and genitals (private parts).                       Wash face,  Genitals (private parts) with your normal soap.             6.  Wash thoroughly, paying special attention to the area where your surgery  will be performed.  7.  Thoroughly rinse your body with warm water from the neck down.  8.  DO NOT shower/wash with your normal soap after using and rinsing off  the CHG Soap.                9.  Pat yourself dry with a clean towel.            10.  Wear clean pajamas.            11.  Place clean sheets on your bed the night of your first shower and do not  sleep with pets. Day of Surgery : Do not apply any lotions/deodorants the morning of surgery.  Please wear clean clothes to the hospital/surgery center.  FAILURE TO FOLLOW THESE INSTRUCTIONS MAY RESULT IN THE CANCELLATION OF YOUR SURGERY PATIENT SIGNATURE_________________________________  NURSE SIGNATURE__________________________________  ________________________________________________________________________

## 2014-05-17 ENCOUNTER — Encounter (HOSPITAL_COMMUNITY): Payer: Self-pay

## 2014-05-17 ENCOUNTER — Encounter (INDEPENDENT_AMBULATORY_CARE_PROVIDER_SITE_OTHER): Payer: Self-pay

## 2014-05-17 ENCOUNTER — Encounter (HOSPITAL_COMMUNITY)
Admission: RE | Admit: 2014-05-17 | Discharge: 2014-05-17 | Disposition: A | Discharge status: Home / Self Care | Payer: Medicare Other | Source: Ambulatory Visit | Attending: Urology | Admitting: Urology

## 2014-05-17 DIAGNOSIS — I10 Essential (primary) hypertension: Secondary | ICD-10-CM | POA: Diagnosis not present

## 2014-05-17 DIAGNOSIS — M129 Arthropathy, unspecified: Secondary | ICD-10-CM | POA: Diagnosis not present

## 2014-05-17 DIAGNOSIS — Z951 Presence of aortocoronary bypass graft: Secondary | ICD-10-CM | POA: Diagnosis not present

## 2014-05-17 DIAGNOSIS — E7800 Pure hypercholesterolemia, unspecified: Secondary | ICD-10-CM | POA: Diagnosis not present

## 2014-05-17 DIAGNOSIS — Z8546 Personal history of malignant neoplasm of prostate: Secondary | ICD-10-CM | POA: Diagnosis not present

## 2014-05-17 DIAGNOSIS — I4891 Unspecified atrial fibrillation: Secondary | ICD-10-CM | POA: Diagnosis not present

## 2014-05-17 DIAGNOSIS — N201 Calculus of ureter: Secondary | ICD-10-CM | POA: Diagnosis not present

## 2014-05-17 DIAGNOSIS — Z7901 Long term (current) use of anticoagulants: Secondary | ICD-10-CM | POA: Diagnosis not present

## 2014-05-17 DIAGNOSIS — Z79899 Other long term (current) drug therapy: Secondary | ICD-10-CM | POA: Diagnosis not present

## 2014-05-17 DIAGNOSIS — Z87891 Personal history of nicotine dependence: Secondary | ICD-10-CM | POA: Diagnosis not present

## 2014-05-17 DIAGNOSIS — I2510 Atherosclerotic heart disease of native coronary artery without angina pectoris: Secondary | ICD-10-CM | POA: Diagnosis not present

## 2014-05-17 DIAGNOSIS — Z7982 Long term (current) use of aspirin: Secondary | ICD-10-CM | POA: Diagnosis not present

## 2014-05-17 HISTORY — DX: Cardiac arrhythmia, unspecified: I49.9

## 2014-05-17 HISTORY — DX: Other complications of anesthesia, initial encounter: T88.59XA

## 2014-05-17 HISTORY — DX: Malignant (primary) neoplasm, unspecified: C80.1

## 2014-05-17 HISTORY — DX: Shortness of breath: R06.02

## 2014-05-17 HISTORY — DX: Unspecified osteoarthritis, unspecified site: M19.90

## 2014-05-17 HISTORY — DX: Adverse effect of unspecified anesthetic, initial encounter: T41.45XA

## 2014-05-17 HISTORY — DX: Cardiac murmur, unspecified: R01.1

## 2014-05-17 LAB — BASIC METABOLIC PANEL
ANION GAP: 9 (ref 5–15)
BUN: 28 mg/dL — AB (ref 6–23)
CHLORIDE: 107 meq/L (ref 96–112)
CO2: 25 meq/L (ref 19–32)
CREATININE: 1.49 mg/dL — AB (ref 0.50–1.35)
Calcium: 9.7 mg/dL (ref 8.4–10.5)
GFR calc Af Amer: 50 mL/min — ABNORMAL LOW (ref >90)
GFR calc non Af Amer: 43 mL/min — ABNORMAL LOW (ref >90)
Glucose, Bld: 119 mg/dL — ABNORMAL HIGH (ref 70–99)
Potassium: 4 mEq/L (ref 3.7–5.3)
Sodium: 141 mEq/L (ref 137–147)

## 2014-05-17 LAB — CBC
HCT: 42.3 % (ref 39.0–52.0)
HEMOGLOBIN: 14 g/dL (ref 13.0–17.0)
MCH: 29.7 pg (ref 26.0–34.0)
MCHC: 33.1 g/dL (ref 30.0–36.0)
MCV: 89.6 fL (ref 78.0–100.0)
Platelets: 183 10*3/uL (ref 150–400)
RBC: 4.72 MIL/uL (ref 4.22–5.81)
RDW: 13.5 % (ref 11.5–15.5)
WBC: 6.5 10*3/uL (ref 4.0–10.5)

## 2014-05-17 LAB — PROTIME-INR
INR: 1.56 — ABNORMAL HIGH (ref 0.00–1.49)
PROTHROMBIN TIME: 18.7 s — AB (ref 11.6–15.2)

## 2014-05-17 LAB — APTT: aPTT: 39 seconds — ABNORMAL HIGH (ref 24–37)

## 2014-05-17 NOTE — Progress Notes (Signed)
PT/PTT results per EPIC from PAT visit on 05/17/2014 sent to Dr Alinda Money

## 2014-05-17 NOTE — Progress Notes (Signed)
Dr Loleta Chance LOV 05/27/2013 per EPIC

## 2014-05-17 NOTE — Progress Notes (Signed)
BMP results from PAT visit on 05/17/2014 per EPIC sent to Dr Alinda Money

## 2014-05-17 NOTE — H&P (Signed)
History of Present Illness Mr. Adam Kane is a 80 year old with the following urologic history:    1) Prostate cancer: He is s/p a BNS RAL radical prostatectomy on 10/08/09. His PSA has been undetectable since surgery.    TNM stage: pT2c N0 Mx  Gleason 3+4=7  Surgical margins: Negative  Pretreatment PSA: 3.45  Pretreatment SHIM: 24    2) Urolithiasis: He was noted to have incidentally detected microscopic hematuria in August 2012. This was rechecked the following month and was persistent. He has had no gross hematuria. He did smoke 1/2 PPD for 30 years but quit over 25 years ago. There is no family history of kidney or bladder cancer. He completed a full urologic evaluation in October 2012 including CT imaging and cystoscopy and was found to have a non-obstructing LUP renal calculus. This stone increased in size over time prompting ESWL in 2014.    Apr 2014: L ESWL of 11 mm left renal calculus    Interval history:    He presents today after having developed moderate left-sided flank pain last week. He was seen in our office by Jimmey Ralph, [facility]. A KUB x-ray was performed and demonstrated that the 2 fragments that had previously been in his left upper pole had migrated to the left UPJ/proximal ureter. I reviewed his KUB x-ray independently today. This demonstrates a total stone burden of approximately 1.6 cm in the proximal left ureter. He has not been febrile. His pain has been well-controlled and he has not required any pain medication today. He has had some associated nausea but no vomiting. He has been tolerating oral intake well. He continues to take warfarin for treatment of his atrial fibrillation. His last INR was 2.6 has been very stable on therapy.   Past Medical History Problems  1. History of Arthritis (V13.4) 2. History of Atrial fibrillation (427.31) 3. History of Coronary Artery Disease (V12.59) 4. History of cardiac disorder (V12.50) 5. History of  hypercholesterolemia (V12.29) 6. History of hypertension (V12.59) 7. Prostate cancer (185)  Surgical History Problems  1. History of Back Surgery 2. History of CABG (CABG) 3. History of Cath Stent Placement 4. History of Cholecystectomy 5. History of Inguinal Hernia Repair 6. History of Laminectomy Lumbar 7. History of Lithotripsy 8. History of Prostatectomy Robotic-Assisted 9. History of Umbilical Hernia Repair  Current Meds 1. Adult Aspirin Low Strength 81 MG TBDP;  Therapy: (Recorded:14Dec2010) to Recorded 2. Atorvastatin Calcium 40 MG Oral Tablet;  Therapy: [id number] to Recorded 3. Carvedilol 25 MG Oral Tablet;  Therapy: (Recorded:02Oct2012) to Recorded 4. Hydrocodone-Acetaminophen 5-325 MG Oral Tablet; Take 1-2 tablets every 4-6 hours for  pain;  Therapy: 10Sep2015 to (Last Rx:10Sep2015) Ordered 5. Lovaza 1 GM Oral Capsule;  Therapy: (Recorded:02Oct2012) to Recorded 6. Nabumetone 500 MG Oral Tablet;  Therapy: 21Apr2012 to Recorded 7. Omeprazole 20 MG Oral Capsule Delayed Release;  Therapy: (Recorded:14Dec2010) to Recorded 8. Tiazac 240 MG/24HR CPCR;  Therapy: (Recorded:14Dec2010) to Recorded 9. Tylenol TABS;  Therapy: (Recorded:10Sep2015) to Recorded 10. Vitamin B-12 SOLN;   Therapy: (Recorded:10Sep2015) to Recorded 11. Vitamin B12 TABS;   Therapy: (Recorded:10Sep2015) to Recorded 12. Vitamin D3 CAPS;   Therapy: (Recorded:27Feb2013) to Recorded 13. Warfarin Sodium 2 MG Oral Tablet;   Therapy: 31Oct2013 to Recorded 14. Zetia 10 MG Oral Tablet;   Therapy: (Recorded:14Dec2010) to Recorded  Allergies Medication  1. Dilaudid TABS 2. Morphine Sulfate (PF) SOLN  Family History Problems  1. Family history of Breast Cancer (V16.3) : Mother 2. Denied: Family history of Prostate  Cancer  Social History Problems  1. Denied: History of Alcohol Use 2. Former smoker (V15.82) 3. Marital History - Currently Married 4. Occupation:   retired 78. Tobacco use (305.1)    1/2 ppd x 30 years =quit 25 years  Review of Systems  Gastrointestinal: nausea, but no vomiting.  Constitutional: no fever and no night sweats.  Cardiovascular: no leg swelling.  Respiratory: no shortness of breath and no cough.    Vitals Vital Signs [Data Includes: Last 1 Day]  Recorded: 14Sep2015 04:53PM  Temperature: 97.8 F Recorded: 14Sep2015 03:46PM  Blood Pressure: 126 / 75 Heart Rate: 120  Physical Exam Constitutional: Well nourished and well developed . No acute distress.  ENT:. The ears and nose are normal in appearance.  Neck: The appearance of the neck is normal and no neck mass is present.  Pulmonary: No respiratory distress, normal respiratory rhythm and effort and clear bilateral breath sounds.  Cardiovascular: Heart rate and rhythm are normal . No peripheral edema.  Abdomen: The abdomen is soft and nontender. No masses are palpated. No CVA tenderness. No hernias are palpable. No hepatosplenomegaly noted.  Skin: Normal skin turgor, no visible rash and no visible skin lesions.  Neuro/Psych:. Mood and affect are appropriate.    Results/Data Urine [Data Includes: Last 1 Day]   [id number]  COLOR BROWN   APPEARANCE CLOUDY   SPECIFIC GRAVITY 1.025   pH 5.5   GLUCOSE NEG mg/dL  BILIRUBIN MOD   KETONE NEG mg/dL  BLOOD LARGE   PROTEIN 100 mg/dL  UROBILINOGEN 1 mg/dL  NITRITE NEG   LEUKOCYTE ESTERASE TRACE   SQUAMOUS EPITHELIAL/HPF NONE SEEN   WBC 21-50 WBC/hpf  RBC TNTC RBC/hpf  BACTERIA MODERATE   CRYSTALS NONE SEEN   CASTS NONE SEEN    Urine has been cultured.    I gently reviewed his KUB x-ray from 05/11/14. Findings are as dictated above.   Assessment Assessed  1. Calculus of left ureter (592.1)  Plan Calculus of left ureter  1. Start: Cephalexin 500 MG Oral Tablet (Cephalexin Monohydrate); TAKE 1 TABLET 3  TIMES DAILY 2. Follow-up Office  Follow-up - Will call to schedule surgery  Status: Complete  Done:  14Sep2015 3. BASIC METABOLIC PANEL;  Status:In Progress - Specimen/Data Collected;   Done:  [id number] 4. URINE CULTURE; Status:In Progress - Specimen/Data Collected;   Done: [id number] Health Maintenance  5. UA With REFLEX; [Do Not Release]; Status:Complete;   Done: [id number] 03:36PM  Discussion/Summary 1. Large left proximal ureteral calculus: We discussed the need to proceed with definitive therapy based on the stone size. Considering that he has had calcium oxalate monohydrate stones and considering the size of the stone, I think there would be a high risk for failure associated with shock wave lithotripsy. I therefore recommended ureteroscopic laser lithotripsy for treatment. We reviewed the potential risks and complications as well as the expected recovery process and the need for a postoperative ureteral stent. His urine has been cultured and he has been started on cephalexin empirically. He has been clearly instructed to call should he develop any signs of systemic infection such as fever. It may be that he has a contaminated urine but I have asked him to start his antibiotics now pending his culture results. He understands that he can continue his Coumadin and aspirin perioperatively.    2. Prostate cancer: His next PSA surveillance visit is due for around February 2016.    Cc: Dr. Crist Infante     Verified Results URINE CULTURE1  [id number] 04:51PM1 Read Drivers  SOURCE : CLEAN CATCH SPECIMEN TYPE: URINE   Test Name Result Flag Reference  CULTURE, URINE1 Culture, Urine1    ===== COLONY COUNT: =====  NO GROWTH   FINAL REPORT: NO GROWTH     1. Amended By: Raynelle Bring; May 17 2014 11:11 AM EST  Signatures Electronically signed by : Raynelle Bring, M.D.; May 17 2014 11:12AM EST

## 2014-05-17 NOTE — Progress Notes (Signed)
CXR per [facility] 07/22/2013

## 2014-05-18 ENCOUNTER — Encounter (HOSPITAL_COMMUNITY): Payer: Self-pay | Admitting: *Deleted

## 2014-05-18 ENCOUNTER — Encounter (HOSPITAL_COMMUNITY): Payer: Medicare Other | Admitting: Certified Registered"

## 2014-05-18 ENCOUNTER — Ambulatory Visit (HOSPITAL_COMMUNITY): Payer: Medicare Other | Admitting: Certified Registered"

## 2014-05-18 ENCOUNTER — Encounter (HOSPITAL_COMMUNITY)
Admission: RE | Disposition: A | Discharge status: Home / Self Care | Payer: Self-pay | Source: Ambulatory Visit | Attending: Urology

## 2014-05-18 ENCOUNTER — Ambulatory Visit (HOSPITAL_COMMUNITY)
Admission: RE | Admit: 2014-05-18 | Discharge: 2014-05-18 | Disposition: A | Discharge status: Home / Self Care | Payer: Medicare Other | Source: Ambulatory Visit | Attending: Urology | Admitting: Urology

## 2014-05-18 DIAGNOSIS — Z8546 Personal history of malignant neoplasm of prostate: Secondary | ICD-10-CM | POA: Insufficient documentation

## 2014-05-18 DIAGNOSIS — Z951 Presence of aortocoronary bypass graft: Secondary | ICD-10-CM | POA: Insufficient documentation

## 2014-05-18 DIAGNOSIS — Z87891 Personal history of nicotine dependence: Secondary | ICD-10-CM | POA: Insufficient documentation

## 2014-05-18 DIAGNOSIS — Z79899 Other long term (current) drug therapy: Secondary | ICD-10-CM | POA: Insufficient documentation

## 2014-05-18 DIAGNOSIS — I4891 Unspecified atrial fibrillation: Secondary | ICD-10-CM | POA: Diagnosis not present

## 2014-05-18 DIAGNOSIS — N201 Calculus of ureter: Secondary | ICD-10-CM | POA: Diagnosis not present

## 2014-05-18 DIAGNOSIS — I10 Essential (primary) hypertension: Secondary | ICD-10-CM | POA: Insufficient documentation

## 2014-05-18 DIAGNOSIS — E7800 Pure hypercholesterolemia, unspecified: Secondary | ICD-10-CM | POA: Insufficient documentation

## 2014-05-18 DIAGNOSIS — M129 Arthropathy, unspecified: Secondary | ICD-10-CM | POA: Diagnosis not present

## 2014-05-18 DIAGNOSIS — Z7982 Long term (current) use of aspirin: Secondary | ICD-10-CM | POA: Insufficient documentation

## 2014-05-18 DIAGNOSIS — Z7901 Long term (current) use of anticoagulants: Secondary | ICD-10-CM | POA: Insufficient documentation

## 2014-05-18 DIAGNOSIS — I2510 Atherosclerotic heart disease of native coronary artery without angina pectoris: Secondary | ICD-10-CM | POA: Insufficient documentation

## 2014-05-18 HISTORY — PX: HOLMIUM LASER APPLICATION: SHX5852

## 2014-05-18 HISTORY — PX: CYSTOSCOPY WITH RETROGRADE PYELOGRAM, URETEROSCOPY AND STENT PLACEMENT: SHX5789

## 2014-05-18 LAB — BASIC METABOLIC PANEL
Anion gap: 11 (ref 5–15)
BUN: 28 mg/dL — AB (ref 6–23)
CHLORIDE: 109 meq/L (ref 96–112)
CO2: 22 meq/L (ref 19–32)
CREATININE: 1.57 mg/dL — AB (ref 0.50–1.35)
Calcium: 9.5 mg/dL (ref 8.4–10.5)
GFR calc Af Amer: 47 mL/min — ABNORMAL LOW (ref >90)
GFR calc non Af Amer: 41 mL/min — ABNORMAL LOW (ref >90)
GLUCOSE: 124 mg/dL — AB (ref 70–99)
POTASSIUM: 4.3 meq/L (ref 3.7–5.3)
Sodium: 142 mEq/L (ref 137–147)

## 2014-05-18 LAB — PROTIME-INR
INR: 1.56 — ABNORMAL HIGH (ref 0.00–1.49)
Prothrombin Time: 18.7 seconds — ABNORMAL HIGH (ref 11.6–15.2)

## 2014-05-18 LAB — APTT: aPTT: 34 seconds (ref 24–37)

## 2014-05-18 SURGERY — CYSTOURETEROSCOPY, WITH RETROGRADE PYELOGRAM AND STENT INSERTION
Anesthesia: General | Site: Penis | Laterality: Left

## 2014-05-18 MED ORDER — EPHEDRINE SULFATE 50 MG/ML IJ SOLN
INTRAMUSCULAR | Status: DC | PRN
  Administered 2014-05-18 (×2): 10 mg via INTRAVENOUS

## 2014-05-18 MED ORDER — LACTATED RINGERS IV SOLN
INTRAVENOUS | Status: DC
  Administered 2014-05-18 (×2): via INTRAVENOUS

## 2014-05-18 MED ORDER — IOHEXOL 300 MG/ML  SOLN
INTRAMUSCULAR | Status: DC | PRN
  Administered 2014-05-18: 7 mL

## 2014-05-18 MED ORDER — SODIUM CHLORIDE 0.9 % IJ SOLN
INTRAMUSCULAR | Status: AC
  Filled 2014-05-18: qty 10

## 2014-05-18 MED ORDER — OXYCODONE-ACETAMINOPHEN 5-325 MG PO TABS: 1.0000 | ORAL_TABLET | ORAL | Status: DC | PRN

## 2014-05-18 MED ORDER — PROPOFOL 10 MG/ML IV BOLUS
INTRAVENOUS | Status: DC | PRN
  Administered 2014-05-18: 150 mg via INTRAVENOUS

## 2014-05-18 MED ORDER — PROPOFOL 10 MG/ML IV BOLUS
INTRAVENOUS | Status: AC
  Filled 2014-05-18: qty 20

## 2014-05-18 MED ORDER — PHENYLEPHRINE 40 MCG/ML (10ML) SYRINGE FOR IV PUSH (FOR BLOOD PRESSURE SUPPORT)
PREFILLED_SYRINGE | INTRAVENOUS | Status: AC
  Filled 2014-05-18: qty 10

## 2014-05-18 MED ORDER — SODIUM CHLORIDE 0.9 % IR SOLN
Status: DC | PRN
  Administered 2014-05-18: 5000 mL

## 2014-05-18 MED ORDER — PHENYLEPHRINE HCL 10 MG/ML IJ SOLN
INTRAMUSCULAR | Status: AC
  Filled 2014-05-18: qty 1

## 2014-05-18 MED ORDER — PHENAZOPYRIDINE HCL 100 MG PO TABS: 100.0000 mg | ORAL_TABLET | Freq: Three times a day (TID) | ORAL | Status: DC | PRN

## 2014-05-18 MED ORDER — CEPHALEXIN 500 MG PO CAPS: 500.0000 mg | ORAL_CAPSULE | Freq: Three times a day (TID) | ORAL | Status: DC

## 2014-05-18 MED ORDER — MIDAZOLAM HCL 2 MG/2ML IJ SOLN
INTRAMUSCULAR | Status: AC
  Filled 2014-05-18: qty 2

## 2014-05-18 MED ORDER — FENTANYL CITRATE 0.05 MG/ML IJ SOLN
25.0000 ug | INTRAMUSCULAR | Status: DC | PRN
  Administered 2014-05-18: 50 ug via INTRAVENOUS

## 2014-05-18 MED ORDER — ONDANSETRON HCL 4 MG/2ML IJ SOLN
INTRAMUSCULAR | Status: DC | PRN
  Administered 2014-05-18: 4 mg via INTRAVENOUS

## 2014-05-18 MED ORDER — MEPERIDINE HCL 50 MG/ML IJ SOLN: 6.2500 mg | INTRAMUSCULAR | Status: DC | PRN

## 2014-05-18 MED ORDER — MIDAZOLAM HCL 5 MG/5ML IJ SOLN
INTRAMUSCULAR | Status: DC | PRN
  Administered 2014-05-18 (×2): 1 mg via INTRAVENOUS

## 2014-05-18 MED ORDER — PROMETHAZINE HCL 25 MG/ML IJ SOLN: 6.2500 mg | INTRAMUSCULAR | Status: DC | PRN

## 2014-05-18 MED ORDER — EPHEDRINE SULFATE 50 MG/ML IJ SOLN
INTRAMUSCULAR | Status: AC
  Filled 2014-05-18: qty 1

## 2014-05-18 MED ORDER — DEXTROSE 5 % IV SOLN
10.0000 mg | INTRAVENOUS | Status: DC | PRN
  Administered 2014-05-18: 50 ug/min via INTRAVENOUS

## 2014-05-18 MED ORDER — CIPROFLOXACIN IN D5W 400 MG/200ML IV SOLN
400.0000 mg | INTRAVENOUS | Status: AC
  Administered 2014-05-18: 400 mg via INTRAVENOUS

## 2014-05-18 MED ORDER — ONDANSETRON HCL 4 MG/2ML IJ SOLN
INTRAMUSCULAR | Status: AC
  Filled 2014-05-18: qty 2

## 2014-05-18 MED ORDER — CIPROFLOXACIN IN D5W 400 MG/200ML IV SOLN
INTRAVENOUS | Status: AC
  Filled 2014-05-18: qty 200

## 2014-05-18 MED ORDER — FENTANYL CITRATE 0.05 MG/ML IJ SOLN
INTRAMUSCULAR | Status: AC
  Filled 2014-05-18: qty 2

## 2014-05-18 MED ORDER — PHENYLEPHRINE HCL 10 MG/ML IJ SOLN
INTRAMUSCULAR | Status: DC | PRN
  Administered 2014-05-18 (×5): 80 ug via INTRAVENOUS

## 2014-05-18 MED ORDER — FENTANYL CITRATE 0.05 MG/ML IJ SOLN
INTRAMUSCULAR | Status: DC | PRN
  Administered 2014-05-18 (×2): 50 ug via INTRAVENOUS

## 2014-05-18 SURGICAL SUPPLY — 16 items
BAG URO CATCHER STRL LF (DRAPE) ×3 IMPLANT
BASKET ZERO TIP NITINOL 2.4FR (BASKET) ×6 IMPLANT
CATH INTERMIT  6FR 70CM (CATHETERS) ×3 IMPLANT
CLOTH BEACON ORANGE TIMEOUT ST (SAFETY) ×3 IMPLANT
DRAPE CAMERA CLOSED 9X96 (DRAPES) ×3 IMPLANT
FIBER LASER FLEXIVA 200 (UROLOGICAL SUPPLIES) ×3 IMPLANT
GLOVE BIOGEL M STRL SZ7.5 (GLOVE) ×3 IMPLANT
GOWN STRL REUS W/TWL LRG LVL3 (GOWN DISPOSABLE) ×6 IMPLANT
GUIDEWIRE ANG ZIPWIRE 038X150 (WIRE) ×3 IMPLANT
GUIDEWIRE STR DUAL SENSOR (WIRE) ×3 IMPLANT
MANIFOLD NEPTUNE II (INSTRUMENTS) ×3 IMPLANT
PACK CYSTO (CUSTOM PROCEDURE TRAY) ×3 IMPLANT
SHEATH ACCESS URETERAL 38CM (SHEATH) ×6 IMPLANT
STENT CONTOUR 6FRX26X.038 (STENTS) ×3 IMPLANT
TUBING CONNECTING 10 (TUBING) ×2 IMPLANT
TUBING CONNECTING 10' (TUBING) ×1

## 2014-05-18 NOTE — Transfer of Care (Signed)
Immediate Anesthesia Transfer of Care Note  Patient: Adam Kane  Procedure(s) Performed: Procedure(s) (LRB): CYSTOSCOPY WITH RETROGRADE PYELOGRAM, URETEROSCOPY AND STENT PLACEMENT (Left) HOLMIUM LASER APPLICATION (Left)  Patient Location: PACU  Anesthesia Type: General  Level of Consciousness: sedated, patient cooperative and responds to stimulation  Airway & Oxygen Therapy: Patient Spontanous Breathing and Patient connected to face mask oxgen  Post-op Assessment: Report given to PACU RN and Post -op Vital signs reviewed and stable  Post vital signs: Reviewed and stable  Complications: No apparent anesthesia complications

## 2014-05-18 NOTE — Op Note (Signed)
Preoperative diagnosis: Left ureteral calculus  Postoperative diagnosis: Left ureteral calculus  Procedure:  1. Cystoscopy 2. Left ureteroscopy and stone removal 3. Ureteroscopic laser lithotripsy 4. Left ureteral stent placement ( 6 x 26 - no string) 5. Left retrograde pyelography with interpretation  Surgeon: Pryor Curia. M.D.  Anesthesia: General  Complications: None  Intraoperative findings: Left retrograde pyelography demonstrated a fixed filling defect within the proximal left ureter consistent with the patient's known calculus without other abnormalities noted.  EBL: Minimal  Specimens: 1. Left ureteral calculus  Disposition of specimens: [facility] for stone analysis  Indication: Adam Kane  is a 80 y.o. patient with urolithiasis and a left ureteral stone. After reviewing the management options for treatment, they elected to proceed with the above surgical procedure(s). We have discussed the potential benefits and risks of the procedure, side effects of the proposed treatment, the likelihood of the patient achieving the goals of the procedure, and any potential problems that might occur during the procedure or recuperation. Informed consent has been obtained.  Description of procedure:  The patient was taken to the operating room and general anesthesia was induced.  The patient was placed in the dorsal lithotomy position, prepped and draped in the usual sterile fashion, and preoperative antibiotics were administered. A preoperative time-out was performed.   Cystourethroscopy was performed.  The patient's urethra was examined and was normal. The bladder was then systematically examined in its entirety. There was no evidence for any bladder tumors, stones, or other mucosal pathology.    Attention then turned to the left ureteral orifice and a ureteral catheter was used to intubate the ureteral orifice.  Omnipaque contrast was injected through  the ureteral catheter and a retrograde pyelogram was performed with findings as dictated above.  A 0.38 sensor guidewire was then attempted to be advanced up the left ureter into the renal pelvis under fluoroscopic guidance.  This was initially unsuccessful with resistance at the level of the stone.  A glidewire was then utilized and was able to bypass the stone.  The ureteral catheter was then advanced over the glidewire and exchanged for the sensor wire.  A 12/14 Fr ureteral access sheath was then advanced over the guide wire to a point below the level of the stone. The digital flexible ureteroscope was then advanced through the access sheath into the ureter next to the guidewire and the calculus was identified and was located in the proximal left ureter.   The stone was then fragmented with the 200 micron holmium laser fiber on a setting of 0.5J and frequency of 20 Hz.   All sizable stones were then removed with a zero tip nitinol basket. There was a large burden of stones.  Some of the stone fragments did migrate into the renal collecting system.  Any sizeable fragments > 2 cm were removed.  Reinspection of the ureter/renal pelvis revealed no remaining visible stones or fragments of significant size.   The safety wire was then replaced and the access sheath removed.  The guidewire was backloaded through the cystoscope and a ureteral stent was advance over the wire using Seldinger technique.  The stent was positioned appropriately under fluoroscopic and cystoscopic guidance.  The wire was then removed with an adequate stent curl noted in the renal pelvis as well as in the bladder.  The bladder was then emptied and the procedure ended.  The patient appeared to tolerate the procedure well and without complications.  The patient was able  to be awakened and transferred to the recovery unit in satisfactory condition.   Pryor Curia MD

## 2014-05-18 NOTE — Discharge Instructions (Signed)

## 2014-05-18 NOTE — Interval H&P Note (Signed)
History and Physical Interval Note:  05/18/2014 11:35 AM  Adam Kane  has presented today for surgery, with the diagnosis of Left Ureteral Stone  The various methods of treatment have been discussed with the patient and family. After consideration of risks, benefits and other options for treatment, the patient has consented to  Procedure(s): CYSTOSCOPY WITH RETROGRADE PYELOGRAM, URETEROSCOPY AND STENT PLACEMENT (Left) HOLMIUM LASER APPLICATION (Left) as a surgical intervention .  The patient's history has been reviewed, patient examined, no change in status, stable for surgery.  I have reviewed the patient's chart and labs.  Questions were answered to the patient's satisfaction.     Lasharon Dunivan,LES

## 2014-05-18 NOTE — Anesthesia Preprocedure Evaluation (Signed)
Anesthesia Evaluation  Patient identified by MRN, date of birth, ID band Patient awake    Reviewed: Allergy & Precautions, H&P , NPO status , Patient's Chart, lab work & pertinent test results  Airway Mallampati: II TM Distance: >3 FB Neck ROM: Full    Dental no notable dental hx.    Pulmonary shortness of breath, former smoker,  breath sounds clear to auscultation  Pulmonary exam normal       Cardiovascular hypertension, Pt. on medications + CAD and + CABG + dysrhythmias Atrial Fibrillation + Valvular Problems/Murmurs Rhythm:Regular Rate:Normal  Echo 2012: normal ef   Neuro/Psych negative psych ROS   GI/Hepatic negative GI ROS, Neg liver ROS,   Endo/Other  negative endocrine ROS  Renal/GU Renal disease     Musculoskeletal   Abdominal   Peds  Hematology   Anesthesia Other Findings   Reproductive/Obstetrics                           Anesthesia Physical  Anesthesia Plan  ASA: III  Anesthesia Plan: General   Post-op Pain Management:    Induction: Intravenous  Airway Management Planned: LMA  Additional Equipment:   Intra-op Plan:   Post-operative Plan: Extubation in OR  Informed Consent: I have reviewed the patients History and Physical, chart, labs and discussed the procedure including the risks, benefits and alternatives for the proposed anesthesia with the patient or authorized representative who has indicated his/her understanding and acceptance.   Dental advisory given  Plan Discussed with: CRNA  Anesthesia Plan Comments:         Anesthesia Quick Evaluation

## 2014-05-18 NOTE — Anesthesia Postprocedure Evaluation (Signed)
Anesthesia Post Note  Patient: Adam Kane  Procedure(s) Performed: Procedure(s) (LRB): CYSTOSCOPY WITH RETROGRADE PYELOGRAM, URETEROSCOPY AND STENT PLACEMENT (Left) HOLMIUM LASER APPLICATION (Left)  Anesthesia type: General  Patient location: PACU  Post pain: Pain level controlled  Post assessment: Post-op Vital signs reviewed  Last Vitals: BP 106/70  Pulse 77  Temp(Src) 37 C (Oral)  Resp 21  Ht 6\' 3"  (1.905 m)  Wt 243 lb (110.224 kg)  BMI 30.37 kg/m2  SpO2 95%  Post vital signs: Reviewed  Level of consciousness: sedated  Complications: No apparent anesthesia complications

## 2014-05-19 ENCOUNTER — Encounter (HOSPITAL_COMMUNITY): Payer: Self-pay | Admitting: Urology

## 2014-08-18 ENCOUNTER — Emergency Department (HOSPITAL_COMMUNITY): Payer: Medicare Other

## 2014-08-18 ENCOUNTER — Encounter (HOSPITAL_COMMUNITY): Payer: Self-pay | Admitting: *Deleted

## 2014-08-18 ENCOUNTER — Emergency Department (HOSPITAL_COMMUNITY)
Admission: EM | Admit: 2014-08-18 | Discharge: 2014-08-18 | Disposition: A | Discharge status: Home / Self Care | Payer: Medicare Other | Attending: Emergency Medicine | Admitting: Emergency Medicine

## 2014-08-18 DIAGNOSIS — Z7901 Long term (current) use of anticoagulants: Secondary | ICD-10-CM | POA: Insufficient documentation

## 2014-08-18 DIAGNOSIS — R011 Cardiac murmur, unspecified: Secondary | ICD-10-CM | POA: Insufficient documentation

## 2014-08-18 DIAGNOSIS — N201 Calculus of ureter: Secondary | ICD-10-CM | POA: Insufficient documentation

## 2014-08-18 DIAGNOSIS — Z79899 Other long term (current) drug therapy: Secondary | ICD-10-CM | POA: Insufficient documentation

## 2014-08-18 DIAGNOSIS — R109 Unspecified abdominal pain: Secondary | ICD-10-CM

## 2014-08-18 DIAGNOSIS — M1990 Unspecified osteoarthritis, unspecified site: Secondary | ICD-10-CM | POA: Diagnosis not present

## 2014-08-18 DIAGNOSIS — Z792 Long term (current) use of antibiotics: Secondary | ICD-10-CM | POA: Diagnosis not present

## 2014-08-18 DIAGNOSIS — E785 Hyperlipidemia, unspecified: Secondary | ICD-10-CM | POA: Diagnosis not present

## 2014-08-18 DIAGNOSIS — Z7982 Long term (current) use of aspirin: Secondary | ICD-10-CM | POA: Insufficient documentation

## 2014-08-18 DIAGNOSIS — N189 Chronic kidney disease, unspecified: Secondary | ICD-10-CM | POA: Insufficient documentation

## 2014-08-18 DIAGNOSIS — I2510 Atherosclerotic heart disease of native coronary artery without angina pectoris: Secondary | ICD-10-CM | POA: Insufficient documentation

## 2014-08-18 DIAGNOSIS — Z8546 Personal history of malignant neoplasm of prostate: Secondary | ICD-10-CM | POA: Insufficient documentation

## 2014-08-18 DIAGNOSIS — Z951 Presence of aortocoronary bypass graft: Secondary | ICD-10-CM | POA: Insufficient documentation

## 2014-08-18 DIAGNOSIS — N23 Unspecified renal colic: Secondary | ICD-10-CM

## 2014-08-18 LAB — COMPREHENSIVE METABOLIC PANEL
ALT: 24 U/L (ref 0–53)
AST: 27 U/L (ref 0–37)
Albumin: 4.1 g/dL (ref 3.5–5.2)
Alkaline Phosphatase: 125 U/L — ABNORMAL HIGH (ref 39–117)
Anion gap: 13 (ref 5–15)
BUN: 23 mg/dL (ref 6–23)
CHLORIDE: 103 meq/L (ref 96–112)
CO2: 25 meq/L (ref 19–32)
Calcium: 10 mg/dL (ref 8.4–10.5)
Creatinine, Ser: 1.17 mg/dL (ref 0.50–1.35)
GFR calc Af Amer: 67 mL/min — ABNORMAL LOW (ref >90)
GFR, EST NON AFRICAN AMERICAN: 57 mL/min — AB (ref >90)
Glucose, Bld: 128 mg/dL — ABNORMAL HIGH (ref 70–99)
Potassium: 4.5 mEq/L (ref 3.7–5.3)
Sodium: 141 mEq/L (ref 137–147)
Total Bilirubin: 1.1 mg/dL (ref 0.3–1.2)
Total Protein: 7.1 g/dL (ref 6.0–8.3)

## 2014-08-18 LAB — URINALYSIS, ROUTINE W REFLEX MICROSCOPIC
Bilirubin Urine: NEGATIVE
GLUCOSE, UA: NEGATIVE mg/dL
Ketones, ur: NEGATIVE mg/dL
Leukocytes, UA: NEGATIVE
Nitrite: NEGATIVE
Protein, ur: NEGATIVE mg/dL
Specific Gravity, Urine: 1.018 (ref 1.005–1.030)
UROBILINOGEN UA: 1 mg/dL (ref 0.0–1.0)
pH: 7 (ref 5.0–8.0)

## 2014-08-18 LAB — CBC WITH DIFFERENTIAL/PLATELET
Basophils Absolute: 0 10*3/uL (ref 0.0–0.1)
Basophils Relative: 0 % (ref 0–1)
EOS PCT: 0 % (ref 0–5)
Eosinophils Absolute: 0 10*3/uL (ref 0.0–0.7)
HEMATOCRIT: 46.5 % (ref 39.0–52.0)
Hemoglobin: 15.6 g/dL (ref 13.0–17.0)
LYMPHS ABS: 0.9 10*3/uL (ref 0.7–4.0)
Lymphocytes Relative: 10 % — ABNORMAL LOW (ref 12–46)
MCH: 30.6 pg (ref 26.0–34.0)
MCHC: 33.5 g/dL (ref 30.0–36.0)
MCV: 91.4 fL (ref 78.0–100.0)
MONOS PCT: 5 % (ref 3–12)
Monocytes Absolute: 0.5 10*3/uL (ref 0.1–1.0)
Neutro Abs: 8 10*3/uL — ABNORMAL HIGH (ref 1.7–7.7)
Neutrophils Relative %: 85 % — ABNORMAL HIGH (ref 43–77)
PLATELETS: 156 10*3/uL (ref 150–400)
RBC: 5.09 MIL/uL (ref 4.22–5.81)
RDW: 14.1 % (ref 11.5–15.5)
WBC: 9.5 10*3/uL (ref 4.0–10.5)

## 2014-08-18 LAB — PROTIME-INR
INR: 1.78 — ABNORMAL HIGH (ref 0.00–1.49)
Prothrombin Time: 20.9 seconds — ABNORMAL HIGH (ref 11.6–15.2)

## 2014-08-18 LAB — URINE MICROSCOPIC-ADD ON

## 2014-08-18 LAB — LIPASE, BLOOD: LIPASE: 27 U/L (ref 11–59)

## 2014-08-18 MED ORDER — HYDROCODONE-ACETAMINOPHEN 5-325 MG PO TABS
1.0000 | ORAL_TABLET | Freq: Once | ORAL | Status: AC
  Administered 2014-08-18: 1 via ORAL
  Filled 2014-08-18: qty 1

## 2014-08-18 MED ORDER — HYDROCODONE-ACETAMINOPHEN 5-325 MG PO TABS: ORAL_TABLET | ORAL | Status: DC

## 2014-08-18 MED ORDER — FENTANYL CITRATE 0.05 MG/ML IJ SOLN
50.0000 ug | Freq: Once | INTRAMUSCULAR | Status: AC
  Administered 2014-08-18: 50 ug via INTRAVENOUS
  Filled 2014-08-18: qty 2

## 2014-08-18 MED ORDER — ONDANSETRON HCL 4 MG/2ML IJ SOLN
4.0000 mg | Freq: Once | INTRAMUSCULAR | Status: AC
  Administered 2014-08-18: 4 mg via INTRAVENOUS
  Filled 2014-08-18: qty 2

## 2014-08-18 NOTE — Discharge Instructions (Signed)
Please read and follow all provided instructions.  Your diagnoses today include:  1. Ureteral colic   2. Flank pain     Tests performed today include:  Urine test that showed blood in your urine and no infection  CT scan which showed a 3 millimeter kidney on the right side  Blood test that showed normal kidney function  Vital signs. See below for your results today.   Medications prescribed:   Vicodin (hydrocodone/acetaminophen) - narcotic pain medication  DO NOT drive or perform any activities that require you to be awake and alert because this medicine can make you drowsy. BE VERY CAREFUL not to take multiple medicines containing Tylenol (also called acetaminophen). Doing so can lead to an overdose which can damage your liver and cause liver failure and possibly death.  Take any prescribed medications only as directed.  Home care instructions:  Follow any educational materials contained in this packet.  Please double your fluid intake for the next several days. Strain your urine and save any stones that may pass.   BE VERY CAREFUL not to take multiple medicines containing Tylenol (also called acetaminophen). Doing so can lead to an overdose which can damage your liver and cause liver failure and possibly death.   Follow-up instructions: Please follow-up with your urologist or the urologist referral (provided on front page) in the next 1 week for further evaluation of your symptoms.  If you need to return to the Emergency Department, go to [facility] and not [facility]. The urologists are located at [facility] and can better care for you at this location.  Return instructions:  If you need to return to the Emergency Department, go to [facility] and not [facility]. The urologists are located at [facility] and can better care for you at this location.   Please return to the Emergency Department if you experience worsening symptoms.    Please return if you develop fever or uncontrolled pain or vomiting.  Please return if you have any other emergent concerns.  Additional Information:  Your vital signs today were: BP 118/65 mmHg   Pulse 78   Temp(Src) 97.6 F (36.4 C) (Oral)   Resp 16   SpO2 94% If your blood pressure (BP) was elevated above 135/85 this visit, please have this repeated by your doctor within one month. --------------

## 2014-08-18 NOTE — ED Notes (Signed)
Pt and family report hx of kidney stones. Starting today right flank pain 9/10, vomiting, and blood in urine.

## 2014-08-18 NOTE — ED Notes (Signed)
At this time due pts allergies, pain protocol not being ordered

## 2014-08-18 NOTE — ED Provider Notes (Signed)
CSN: [id number]     Arrival date & time 08/18/14  1616 History   First MD Initiated Contact with Patient 08/18/14 1652     Chief Complaint  Patient presents with  . Flank Pain  . Abdominal Pain   (Consider location/radiation/quality/duration/timing/severity/associated sxs/prior Treatment) HPI Comments: Patient with history or ureteral stones, previous lithotripsy and laser, CABG, prostatectomy -- presents with c/o R flank pain beginning at noon. Pain does not radiate and has not moved. It has been associated with vomiting and hematuria which has cleared. No dysuria. No diarrhea or constipation. Patient is on warfarin due to chronic atrial fibrillation.  The history is provided by the patient and medical records.    Past Medical History  Diagnosis Date  . CAD (coronary artery disease)   . S/P CABG (coronary artery bypass graft) 2001  . Dyslipidemia   . Systemic hypertension   . A-fib   . Chronic kidney disease     kidney stone  . Complication of anesthesia     if given anesthesia too quickly experiences nausea and vomiting   . Dysrhythmia   . Heart murmur     childhood  . Shortness of breath     mild exertion   . Arthritis   . Cancer     prostate cancer 2009   Past Surgical History  Procedure Laterality Date  . Coronary artery bypass graft  2001    LIMA to LAD,SVG to PDA  . Cholecystectomy    . Hernia repair    . Prostatectomy    . Cardiac catheterization  02/20/94  . Lumbar laminectomy/decompression microdiscectomy Left 03/07/2013    Procedure: LUMBAR FOUR-FIVE EXTRAFORAMINAL LUMBAR LAMINECTOMY/DECOMPRESSION MICRODISCECTOMY 1 LEVEL;  Surgeon: Charlie Pitter, MD;  Location: [facility];  Service: Neurosurgery;  Laterality: Left;  Left lumbar four-five extraforaminal microdiskectomy  . Coronary artery bypass graft      2 vessel 2001  . Tonsillectomy    . Eye surgery      right eye cataract surgery   . Appendectomy    . Cystoscopy with retrograde pyelogram, ureteroscopy  and stent placement Left 05/18/2014    Procedure: [place], URETEROSCOPY AND STENT PLACEMENT;  Surgeon: Raynelle Bring, MD;  Location: [facility];  Service: Urology;  Laterality: Left;  . Holmium laser application Left 5/00/9381    Procedure: HOLMIUM LASER APPLICATION;  Surgeon: Raynelle Bring, MD;  Location: [facility];  Service: Urology;  Laterality: Left;   Family History  Problem Relation Age of Onset  . Cancer Mother    History  Substance Use Topics  . Smoking status: Former Smoker    Types: Cigarettes    Quit date: 09/01/1973  . Smokeless tobacco: Never Used  . Alcohol Use: No    Review of Systems  Constitutional: Negative for fever.  HENT: Negative for rhinorrhea and sore throat.   Eyes: Negative for redness.  Respiratory: Negative for cough.   Cardiovascular: Negative for chest pain.  Gastrointestinal: Negative for nausea, vomiting, abdominal pain and diarrhea.  Genitourinary: Positive for hematuria and flank pain. Negative for dysuria, frequency, scrotal swelling, difficulty urinating and testicular pain.  Musculoskeletal: Negative for myalgias.  Skin: Negative for rash.  Neurological: Negative for headaches.    Allergies  Dilaudid and Morphine and related  Home Medications   Prior to Admission medications   Medication Sig Start Date End Date Taking? Authorizing Provider  aspirin EC 81 MG tablet Take 81 mg by mouth daily.    Historical Provider, MD  atorvastatin (LIPITOR) 40 MG tablet Take 40 mg by mouth at bedtime.     Historical Provider, MD  carvedilol (COREG) 25 MG tablet Take 12.5 mg by mouth 2 (two) times daily with a meal. Takes 1/2 tablet twice daily    Historical Provider, MD  cephALEXin (KEFLEX) 500 MG capsule Take 500 mg by mouth 3 (three) times daily. 05/15/14   Historical Provider, MD  cephALEXin (KEFLEX) 500 MG capsule Take 1 capsule (500 mg total) by mouth 3 (three) times daily. 05/18/14   Raynelle Bring, MD  cholecalciferol (VITAMIN D)  1000 UNITS tablet Take 1,000 Units by mouth daily.    Historical Provider, MD  diltiazem (TIAZAC) 240 MG 24 hr capsule Take 240 mg by mouth every morning.     Historical Provider, MD  ezetimibe (ZETIA) 10 MG tablet Take 10 mg by mouth every morning.     Historical Provider, MD  nabumetone (RELAFEN) 500 MG tablet Take 500 mg by mouth. Monday Wednesday Friday and Sunday    Historical Provider, MD  omega-3 acid ethyl esters (LOVAZA) 1 G capsule Take 1 g by mouth 4 (four) times a week.     Historical Provider, MD  omeprazole (PRILOSEC) 20 MG capsule Take 20 mg by mouth every morning.     Historical Provider, MD  oxyCODONE-acetaminophen (ROXICET) 5-325 MG per tablet Take 1 tablet by mouth every 4 (four) hours as needed for severe pain. 05/18/14   Raynelle Bring, MD  phenazopyridine (PYRIDIUM) 100 MG tablet Take 1 tablet (100 mg total) by mouth 3 (three) times daily as needed for pain (for burning). 05/18/14   Raynelle Bring, MD  warfarin (COUMADIN) 2 MG tablet Take 2 mg by mouth daily. Takes 1 tab daily except takes 1.5 tab on Monday and Friday    Historical Provider, MD   BP 147/100 mmHg  Pulse 63  Temp(Src) 97.6 F (36.4 C) (Oral)  Resp 20  SpO2 98%   Physical Exam  Constitutional: He appears well-developed and well-nourished. He appears distressed.  HENT:  Head: Normocephalic and atraumatic.  Eyes: Conjunctivae are normal. Right eye exhibits no discharge. Left eye exhibits no discharge.  Neck: Normal range of motion. Neck supple.  Cardiovascular: Normal rate, regular rhythm and normal heart sounds.   Pulmonary/Chest: Effort normal and breath sounds normal.  Abdominal: Soft. Bowel sounds are normal. He exhibits no abdominal bruit and no pulsatile midline mass. There is no tenderness. There is no rebound, no guarding and no CVA tenderness.  Neurological: He is alert.  Skin: Skin is warm and dry.  Psychiatric: He has a normal mood and affect.  Nursing note and vitals reviewed.   ED Course   Procedures (including critical care time) Labs Review Labs Reviewed  URINALYSIS, ROUTINE W REFLEX MICROSCOPIC - Abnormal; Notable for the following:    APPearance CLOUDY (*)    Hgb urine dipstick LARGE (*)    All other components within normal limits  COMPREHENSIVE METABOLIC PANEL - Abnormal; Notable for the following:    Glucose, Bld 128 (*)    Alkaline Phosphatase 125 (*)    GFR calc non Af Amer 57 (*)    GFR calc Af Amer 67 (*)    All other components within normal limits  CBC WITH DIFFERENTIAL - Abnormal; Notable for the following:    Neutrophils Relative % 85 (*)    Neutro Abs 8.0 (*)    Lymphocytes Relative 10 (*)    All other components within normal limits  PROTIME-INR - Abnormal; Notable  for the following:    Prothrombin Time 20.9 (*)    INR 1.78 (*)    All other components within normal limits  LIPASE, BLOOD  URINE MICROSCOPIC-ADD ON    Imaging Review Ct Renal Stone Study  08/18/2014   CLINICAL DATA:  Right flank pain, hematuria, vomiting.  EXAM: CT ABDOMEN AND PELVIS WITHOUT CONTRAST  TECHNIQUE: Multidetector CT imaging of the abdomen and pelvis was performed following the standard protocol without IV contrast.  COMPARISON:  None.  FINDINGS: Lower chest:  Pleural thickening/ fluid at the right lung base.  Hepatobiliary: Numerous hepatic cysts measuring up to 4.3 cm in the medial segment left hepatic dome (series 2/ image 13).  Status post cholecystectomy. No intrahepatic or extrahepatic ductal dilatation.  Pancreas: Within normal limits.  Spleen: Within normal limits.  Adrenals/Urinary Tract: 11 mm probable right adrenal adenoma (series 2/ image 29).  Left adrenal gland is within normal limits.  Left kidney is within normal limits. Right kidney is notable for a 2.5 cm right lower pole renal cyst (series 2/ image 44). Mild right hydroureteronephrosis.  3 mm distal right ureteral calculus at the UVJ (series 2/ image 93).  Bladder is low lying and underdistended.   Stomach/Bowel: Stomach is notable for a small hiatal hernia.  No evidence of bowel obstruction.  Extensive colonic diverticulosis, without associated inflammatory changes.  Vascular/Lymphatic: Atherosclerotic calcifications of the abdominal aorta and branch vessels. 3.4 x 3.4 cm infrarenal abdominal aortic aneurysm (series 2/ image 50).  No suspicious abdominopelvic lymphadenopathy.  Reproductive: Status post prostatectomy.  Other: No abdominopelvic ascites.  Small fat containing bilateral inguinal hernias.  Musculoskeletal: Degenerative changes of the visualized thoracolumbar spine.  IMPRESSION: 3 mm distal right ureteral calculus at the UVJ. Mild right hydroureteronephrosis.  Additional ancillary findings as above.   Electronically Signed   By: Julian Hy M.D.   On: 08/18/2014 17:51     EKG Interpretation None       4:55 PM Patient seen and examined. Work-up initiated. Medications ordered. Will give 1mcg fentanyl and see how he tolerates. Also CT ordered.   Vital signs reviewed and are as follows: BP 147/100 mmHg  Pulse 63  Temp(Src) 97.6 F (36.4 C) (Oral)  Resp 20  SpO2 98%  8:37 PM Pt discussed with and seen by Dr. Aline Brochure.   Patient tolerated fentanyl with some nausea. He was then given oral Vicodin which has controlled his pain.  Patient informed of all results. Family member at bedside. Patient feels comfortable with going home, straining urine, and following up with his urologist.  Patient counseled on kidney stone treatment. Urged patient to strain urine and save any stones. Urged urology follow-up and return to [facility] with any complications. Counseled patient to maintain good fluid intake.   Patient counseled on use of narcotic pain medications. Counseled not to combine these medications with others containing tylenol. Urged not to drink alcohol, drive, or perform any other activities that requires focus while taking these medications. The patient verbalizes understanding  and agrees with the plan.  Discussed fall risk while on narcotic medications. Daughter states that the patient does have some balance issues. Encouraged lowest dose to control pain.   MDM   Final diagnoses:  Flank pain  Ureteral colic   Patient with ureteral colic, no infection. 3 mm right UVJ stone noted on CT scan. Pain controlled in emergency department. Patient is tolerating orals. He is comfortable with discharge to home at this time. He has appropriate urology follow-up.  No  dangerous or life-threatening conditions suspected or identified by history, physical exam, and by work-up. No indications for hospitalization identified.      Carlisle Cater, PA-C 08/18/14 2040  Pamella Pert, MD 08/19/14 [phone]

## 2014-08-21 ENCOUNTER — Encounter (HOSPITAL_COMMUNITY): Payer: Self-pay | Admitting: General Practice

## 2014-08-21 ENCOUNTER — Ambulatory Visit (HOSPITAL_COMMUNITY): Payer: Medicare Other

## 2014-08-21 ENCOUNTER — Ambulatory Visit (HOSPITAL_COMMUNITY)
Admission: AD | Admit: 2014-08-21 | Discharge: 2014-08-22 | Disposition: A | Discharge status: Home / Self Care | Payer: Medicare Other | Source: Ambulatory Visit | Attending: Urology | Admitting: Urology

## 2014-08-21 ENCOUNTER — Encounter (HOSPITAL_COMMUNITY)
Admission: AD | Disposition: A | Discharge status: Home / Self Care | Payer: Self-pay | Source: Ambulatory Visit | Attending: Urology

## 2014-08-21 ENCOUNTER — Ambulatory Visit (HOSPITAL_COMMUNITY): Payer: Medicare Other | Admitting: Certified Registered"

## 2014-08-21 ENCOUNTER — Other Ambulatory Visit: Payer: Self-pay | Admitting: Urology

## 2014-08-21 DIAGNOSIS — I129 Hypertensive chronic kidney disease with stage 1 through stage 4 chronic kidney disease, or unspecified chronic kidney disease: Secondary | ICD-10-CM | POA: Insufficient documentation

## 2014-08-21 DIAGNOSIS — Z885 Allergy status to narcotic agent status: Secondary | ICD-10-CM | POA: Diagnosis not present

## 2014-08-21 DIAGNOSIS — E785 Hyperlipidemia, unspecified: Secondary | ICD-10-CM | POA: Diagnosis not present

## 2014-08-21 DIAGNOSIS — N289 Disorder of kidney and ureter, unspecified: Secondary | ICD-10-CM | POA: Diagnosis not present

## 2014-08-21 DIAGNOSIS — N189 Chronic kidney disease, unspecified: Secondary | ICD-10-CM | POA: Diagnosis not present

## 2014-08-21 DIAGNOSIS — I2510 Atherosclerotic heart disease of native coronary artery without angina pectoris: Secondary | ICD-10-CM | POA: Insufficient documentation

## 2014-08-21 DIAGNOSIS — I4891 Unspecified atrial fibrillation: Secondary | ICD-10-CM | POA: Diagnosis not present

## 2014-08-21 DIAGNOSIS — Z8546 Personal history of malignant neoplasm of prostate: Secondary | ICD-10-CM | POA: Insufficient documentation

## 2014-08-21 DIAGNOSIS — Z951 Presence of aortocoronary bypass graft: Secondary | ICD-10-CM | POA: Insufficient documentation

## 2014-08-21 DIAGNOSIS — N201 Calculus of ureter: Secondary | ICD-10-CM | POA: Insufficient documentation

## 2014-08-21 DIAGNOSIS — M1990 Unspecified osteoarthritis, unspecified site: Secondary | ICD-10-CM | POA: Insufficient documentation

## 2014-08-21 DIAGNOSIS — I714 Abdominal aortic aneurysm, without rupture: Secondary | ICD-10-CM | POA: Insufficient documentation

## 2014-08-21 DIAGNOSIS — Z01818 Encounter for other preprocedural examination: Secondary | ICD-10-CM

## 2014-08-21 HISTORY — PX: CYSTOSCOPY WITH RETROGRADE PYELOGRAM, URETEROSCOPY AND STENT PLACEMENT: SHX5789

## 2014-08-21 HISTORY — DX: Diverticulosis of large intestine without perforation or abscess without bleeding: K57.30

## 2014-08-21 HISTORY — DX: Abdominal aortic aneurysm, without rupture, unspecified: I71.40

## 2014-08-21 HISTORY — DX: Abdominal aortic aneurysm, without rupture: I71.4

## 2014-08-21 LAB — PROTIME-INR
INR: 2.34 — ABNORMAL HIGH (ref 0.00–1.49)
PROTHROMBIN TIME: 25.8 s — AB (ref 11.6–15.2)

## 2014-08-21 SURGERY — CYSTOURETEROSCOPY, WITH RETROGRADE PYELOGRAM AND STENT INSERTION
Anesthesia: General | Site: Ureter | Laterality: Right

## 2014-08-21 MED ORDER — PROPOFOL 10 MG/ML IV BOLUS
INTRAVENOUS | Status: AC
  Filled 2014-08-21: qty 20

## 2014-08-21 MED ORDER — OXYBUTYNIN CHLORIDE 5 MG PO TABS
5.0000 mg | ORAL_TABLET | Freq: Three times a day (TID) | ORAL | Status: DC | PRN
  Filled 2014-08-21: qty 1

## 2014-08-21 MED ORDER — EZETIMIBE 10 MG PO TABS
10.0000 mg | ORAL_TABLET | Freq: Every morning | ORAL | Status: DC
  Filled 2014-08-21: qty 1

## 2014-08-21 MED ORDER — VITAMIN D3 25 MCG (1000 UNIT) PO TABS
1000.0000 [IU] | ORAL_TABLET | Freq: Every day | ORAL | Status: DC
  Filled 2014-08-21: qty 1

## 2014-08-21 MED ORDER — LACTATED RINGERS IV SOLN
INTRAVENOUS | Status: DC | PRN
  Administered 2014-08-21: 19:00:00 via INTRAVENOUS

## 2014-08-21 MED ORDER — DIPHENHYDRAMINE HCL 50 MG/ML IJ SOLN: 12.5000 mg | Freq: Four times a day (QID) | INTRAMUSCULAR | Status: DC | PRN

## 2014-08-21 MED ORDER — BISACODYL 10 MG RE SUPP: 10.0000 mg | Freq: Every day | RECTAL | Status: DC | PRN

## 2014-08-21 MED ORDER — METOCLOPRAMIDE HCL 5 MG/ML IJ SOLN
INTRAMUSCULAR | Status: DC | PRN
  Administered 2014-08-21: 10 mg via INTRAVENOUS

## 2014-08-21 MED ORDER — FENTANYL CITRATE 0.05 MG/ML IJ SOLN
INTRAMUSCULAR | Status: DC | PRN
  Administered 2014-08-21 (×2): 50 ug via INTRAVENOUS

## 2014-08-21 MED ORDER — IOHEXOL 300 MG/ML  SOLN
INTRAMUSCULAR | Status: DC | PRN
Start: 2014-08-21 — End: 2014-08-21
  Administered 2014-08-21: 50 mL

## 2014-08-21 MED ORDER — FENTANYL CITRATE 0.05 MG/ML IJ SOLN
25.0000 ug | INTRAMUSCULAR | Status: DC | PRN
  Administered 2014-08-22: 50 ug via INTRAVENOUS
  Filled 2014-08-21: qty 2

## 2014-08-21 MED ORDER — FENTANYL CITRATE 0.05 MG/ML IJ SOLN
INTRAMUSCULAR | Status: AC
  Filled 2014-08-21: qty 2

## 2014-08-21 MED ORDER — DILTIAZEM HCL ER BEADS 240 MG PO CP24
240.0000 mg | ORAL_CAPSULE | Freq: Every morning | ORAL | Status: DC
  Filled 2014-08-21: qty 1

## 2014-08-21 MED ORDER — CEFAZOLIN SODIUM-DEXTROSE 2-3 GM-% IV SOLR
INTRAVENOUS | Status: AC
  Filled 2014-08-21: qty 50

## 2014-08-21 MED ORDER — WARFARIN SODIUM 2 MG PO TABS
3.0000 mg | ORAL_TABLET | ORAL | Status: DC
Start: 2014-08-23 — End: 2014-08-22

## 2014-08-21 MED ORDER — ASPIRIN EC 81 MG PO TBEC
81.0000 mg | DELAYED_RELEASE_TABLET | Freq: Every day | ORAL | Status: DC
  Filled 2014-08-21: qty 1

## 2014-08-21 MED ORDER — DIPHENHYDRAMINE HCL 12.5 MG/5ML PO ELIX: 12.5000 mg | ORAL_SOLUTION | Freq: Four times a day (QID) | ORAL | Status: DC | PRN

## 2014-08-21 MED ORDER — WARFARIN SODIUM 2 MG PO TABS
2.0000 mg | ORAL_TABLET | ORAL | Status: DC
  Filled 2014-08-21: qty 1

## 2014-08-21 MED ORDER — NABUMETONE 500 MG PO TABS
500.0000 mg | ORAL_TABLET | Freq: Every day | ORAL | Status: DC
  Filled 2014-08-21: qty 1

## 2014-08-21 MED ORDER — SODIUM CHLORIDE 0.9 % IR SOLN
Status: DC | PRN
  Administered 2014-08-21: 6000 mL

## 2014-08-21 MED ORDER — ATORVASTATIN CALCIUM 40 MG PO TABS
40.0000 mg | ORAL_TABLET | Freq: Every day | ORAL | Status: DC
  Administered 2014-08-21: 40 mg via ORAL
  Filled 2014-08-21: qty 1

## 2014-08-21 MED ORDER — ZOLPIDEM TARTRATE 5 MG PO TABS: 5.0000 mg | ORAL_TABLET | Freq: Every evening | ORAL | Status: DC | PRN

## 2014-08-21 MED ORDER — ONDANSETRON HCL 4 MG/2ML IJ SOLN
INTRAMUSCULAR | Status: AC
  Filled 2014-08-21: qty 2

## 2014-08-21 MED ORDER — PHENYLEPHRINE 40 MCG/ML (10ML) SYRINGE FOR IV PUSH (FOR BLOOD PRESSURE SUPPORT)
PREFILLED_SYRINGE | INTRAVENOUS | Status: AC
  Filled 2014-08-21: qty 10

## 2014-08-21 MED ORDER — CEFAZOLIN SODIUM 1-5 GM-% IV SOLN
1.0000 g | Freq: Three times a day (TID) | INTRAVENOUS | Status: DC
  Administered 2014-08-22: 1 g via INTRAVENOUS
  Filled 2014-08-21 (×2): qty 50

## 2014-08-21 MED ORDER — ONDANSETRON HCL 4 MG/2ML IJ SOLN
INTRAMUSCULAR | Status: DC | PRN
  Administered 2014-08-21: 4 mg via INTRAVENOUS

## 2014-08-21 MED ORDER — CARVEDILOL 12.5 MG PO TABS
12.5000 mg | ORAL_TABLET | Freq: Two times a day (BID) | ORAL | Status: DC
  Filled 2014-08-21: qty 1

## 2014-08-21 MED ORDER — ACETAMINOPHEN 325 MG PO TABS: 650.0000 mg | ORAL_TABLET | ORAL | Status: DC | PRN

## 2014-08-21 MED ORDER — 0.9 % SODIUM CHLORIDE (POUR BTL) OPTIME
TOPICAL | Status: DC | PRN
  Administered 2014-08-21: 1000 mL

## 2014-08-21 MED ORDER — PROPOFOL 10 MG/ML IV BOLUS
INTRAVENOUS | Status: DC | PRN
  Administered 2014-08-21: 150 mg via INTRAVENOUS

## 2014-08-21 MED ORDER — WARFARIN - PHYSICIAN DOSING INPATIENT: Freq: Every day | Status: DC

## 2014-08-21 MED ORDER — EPHEDRINE SULFATE 50 MG/ML IJ SOLN
INTRAMUSCULAR | Status: DC | PRN
  Administered 2014-08-21 (×2): 10 mg via INTRAVENOUS

## 2014-08-21 MED ORDER — FENTANYL CITRATE 0.05 MG/ML IJ SOLN: 25.0000 ug | INTRAMUSCULAR | Status: DC | PRN

## 2014-08-21 MED ORDER — KCL IN DEXTROSE-NACL 20-5-0.45 MEQ/L-%-% IV SOLN
INTRAVENOUS | Status: DC
  Administered 2014-08-21: 23:00:00 via INTRAVENOUS
  Filled 2014-08-21 (×3): qty 1000

## 2014-08-21 MED ORDER — PHENAZOPYRIDINE HCL 100 MG PO TABS
100.0000 mg | ORAL_TABLET | Freq: Three times a day (TID) | ORAL | Status: DC | PRN
  Filled 2014-08-21: qty 1

## 2014-08-21 MED ORDER — PANTOPRAZOLE SODIUM 40 MG PO TBEC
40.0000 mg | DELAYED_RELEASE_TABLET | Freq: Every day | ORAL | Status: DC
  Filled 2014-08-21: qty 1

## 2014-08-21 MED ORDER — PROMETHAZINE HCL 25 MG/ML IJ SOLN: 6.2500 mg | INTRAMUSCULAR | Status: DC | PRN

## 2014-08-21 MED ORDER — PHENYLEPHRINE HCL 10 MG/ML IJ SOLN
INTRAMUSCULAR | Status: DC | PRN
  Administered 2014-08-21 (×2): 80 ug via INTRAVENOUS

## 2014-08-21 MED ORDER — ONDANSETRON HCL 4 MG/2ML IJ SOLN: 4.0000 mg | INTRAMUSCULAR | Status: DC | PRN

## 2014-08-21 MED ORDER — CEFAZOLIN SODIUM-DEXTROSE 2-3 GM-% IV SOLR
2.0000 g | INTRAVENOUS | Status: AC
  Administered 2014-08-21: 2 g via INTRAVENOUS

## 2014-08-21 SURGICAL SUPPLY — 11 items
BAG URO CATCHER STRL LF (DRAPE) ×2 IMPLANT
BASKET ZERO TIP NITINOL 2.4FR (BASKET) ×2 IMPLANT
CATH URET 5FR 28IN OPEN ENDED (CATHETERS) IMPLANT
CLOTH BEACON ORANGE TIMEOUT ST (SAFETY) ×2 IMPLANT
DRAPE CAMERA CLOSED 9X96 (DRAPES) ×2 IMPLANT
GLOVE SURG SS PI 8.0 STRL IVOR (GLOVE) IMPLANT
GOWN STRL REUS W/TWL XL LVL3 (GOWN DISPOSABLE) ×2 IMPLANT
MANIFOLD NEPTUNE II (INSTRUMENTS) ×2 IMPLANT
PACK CYSTO (CUSTOM PROCEDURE TRAY) ×2 IMPLANT
STENT CONTOUR 6FRX28X.038 (STENTS) ×2 IMPLANT
TUBING CONNECTING 10 (TUBING) ×2 IMPLANT

## 2014-08-21 NOTE — Anesthesia Preprocedure Evaluation (Signed)
Anesthesia Evaluation  Patient identified by MRN, date of birth, ID band Patient awake    Reviewed: Allergy & Precautions, H&P , NPO status , Patient's Chart, lab work & pertinent test results  History of Anesthesia Complications (+) history of anesthetic complications  Airway Mallampati: II  TM Distance: >3 FB Neck ROM: Full    Dental no notable dental hx.    Pulmonary shortness of breath, former smoker,  breath sounds clear to auscultation  Pulmonary exam normal       Cardiovascular hypertension, Pt. on medications and Pt. on home beta blockers + CAD and + CABG + dysrhythmias Atrial Fibrillation + Valvular Problems/Murmurs Rhythm:Regular Rate:Normal     Neuro/Psych  Neuromuscular disease negative psych ROS   GI/Hepatic negative GI ROS, Neg liver ROS,   Endo/Other  negative endocrine ROS  Renal/GU Renal disease  negative genitourinary   Musculoskeletal  (+) Arthritis -,   Abdominal   Peds negative pediatric ROS (+)  Hematology negative hematology ROS (+)   Anesthesia Other Findings   Reproductive/Obstetrics negative OB ROS                             Anesthesia Physical Anesthesia Plan  ASA: III and emergent  Anesthesia Plan: General   Post-op Pain Management:    Induction: Intravenous  Airway Management Planned: LMA  Additional Equipment:   Intra-op Plan:   Post-operative Plan: Extubation in OR  Informed Consent: I have reviewed the patients History and Physical, chart, labs and discussed the procedure including the risks, benefits and alternatives for the proposed anesthesia with the patient or authorized representative who has indicated his/her understanding and acceptance.   Dental advisory given  Plan Discussed with: CRNA  Anesthesia Plan Comments:         Anesthesia Quick Evaluation

## 2014-08-21 NOTE — Anesthesia Postprocedure Evaluation (Signed)
  Anesthesia Post-op Note  Patient: Adam Kane  Procedure(s) Performed: Procedure(s) (LRB): CYSTOSCOPY WITH RETROGRADE PYELOGRAM, URETEROSCOPY AND STENT PLACEMENT WITH STONE EXTRACTION basket (Right)  Patient Location: PACU  Anesthesia Type: General  Level of Consciousness: awake and alert   Airway and Oxygen Therapy: Patient Spontanous Breathing  Post-op Pain: mild  Post-op Assessment: Post-op Vital signs reviewed, Patient's Cardiovascular Status Stable, Respiratory Function Stable, Patent Airway and No signs of Nausea or vomiting  Last Vitals:  Filed Vitals:   08/21/14 2045  BP: 124/82  Pulse: 94  Temp:   Resp: 13    Post-op Vital Signs: stable   Complications: No apparent anesthesia complications

## 2014-08-21 NOTE — Brief Op Note (Signed)
08/21/2014  8:02 PM  PATIENT:  Adam Kane  80 y.o. male  PRE-OPERATIVE DIAGNOSIS:  RIGHT URETERAL VESSEL JUNCTURE STONE  POST-OPERATIVE DIAGNOSIS:  right ureteral stone  PROCEDURE:  Procedure(s): CYSTOSCOPY WITH RETROGRADE PYELOGRAM, URETEROSCOPY AND STENT PLACEMENT WITH STONE EXTRACTION basket (Right)  SURGEON:  Surgeon(s) and Role:    * Malka So, MD - Primary  PHYSICIAN ASSISTANT:   ASSISTANTS: none   ANESTHESIA:   general  EBL:     BLOOD ADMINISTERED:none  DRAINS: 6 x 28 right JJ stent   LOCAL MEDICATIONS USED:  NONE  SPECIMEN:  Source of Specimen:  right ureteral stone  DISPOSITION OF SPECIMEN:  to family  COUNTS:  YES  TOURNIQUET:  * No tourniquets in log *  DICTATION: .Other Dictation: Dictation Number [id number]  PLAN OF CARE: Admit for overnight observation  PATIENT DISPOSITION:  PACU - hemodynamically stable.   Delay start of Pharmacological VTE agent (>24hrs) due to surgical blood loss or risk of bleeding: not applicable

## 2014-08-21 NOTE — Transfer of Care (Signed)
Immediate Anesthesia Transfer of Care Note  Patient: Adam Kane  Procedure(s) Performed: Procedure(s) (LRB): CYSTOSCOPY WITH RETROGRADE PYELOGRAM, URETEROSCOPY AND STENT PLACEMENT WITH STONE EXTRACTION basket (Right)  Patient Location: PACU  Anesthesia Type: General  Level of Consciousness: sedated, patient cooperative and responds to stimulation  Airway & Oxygen Therapy: Patient Spontanous Breathing and Patient connected to face mask oxgen  Post-op Assessment: Report given to PACU RN and Post -op Vital signs reviewed and stable  Post vital signs: Reviewed and stable  Complications: No apparent anesthesia complications

## 2014-08-21 NOTE — Discharge Instructions (Addendum)
Ureteral Stent Implantation Ureteral stent implantation is the implantation of a soft plastic tube with multiple holes into the tube that drains urine from your kidney to your bladder (ureter). The stent helps drain your kidney when there is a blockage of the flow of urine in your ureter. The stent has a coil on each end to keep it from falling out. One end stays in the kidney. The other end stays in the bladder. It is most often taken out after any blockage has been removed or your ureter has healed. Short-term stents have a string attached to make removal quite easy. Removal of a short-term stent can be done in your health care provider's office or by you at home. Long-term stents need to be changed every few months. LET [facility] CARE PROVIDER KNOW ABOUT:  Any allergies you have.  All medicines you are taking, including vitamins, herbs, eye drops, creams, and over-the-counter medicines.  Previous problems you or members of your family have had with the use of anesthetics.  Any blood disorders you have.  Previous surgeries you have had.  Medical conditions you have. RISKS AND COMPLICATIONS Generally, ureteral stent implantation is a safe procedure. However, as with any procedure, complications can occur. Possible complications include:  Movement of the stent away from where it was originally placed (migration). This may affect the ability of the stent to properly drain your kidney. If migration of the stent occurs, the stent may need to be replaced or repositioned.  Perforation of the ureter.  Infection. BEFORE THE PROCEDURE  You may be asked to wash your genital area with sterile soap the morning of your procedure.  You may be given an oral antibiotic which you should take with a sip of water as prescribed by your health care provider.  You may be asked to not eat or drink for 8 hours before the surgery. PROCEDURE  First you will be given an anesthetic so you do not feel pain  during the procedure.  Your health care provider will insert a special lighted instrument called a cystoscope into your bladder. This allows your health care provider to see the opening to your ureter.  A thin wire is carefully threaded into your bladder and up the ureter. The stent is inserted over the wire and the wire is then removed.  Your bladder will be emptied of urine. AFTER THE PROCEDURE You will be taken to a recovery room until it is okay for you to go home. Document Released: 08/15/2000 Document Revised: 08/23/2013 Document Reviewed: 01/25/2013 [facility] Patient Information 2015 [place], [place]. This information is not intended to replace advice given to you by your health care provider. Make sure you discuss any questions you have with your health care provider.  You may pull the stent by the attached string on Thursday morning.   Please bring your stone to the office for analysis.

## 2014-08-21 NOTE — H&P (Signed)
Active Problems Problems  1. Calculus of distal right ureter (N20.1) 2. Calculus of left ureter (N20.1) 3. Dysuria (R30.0) 4. Erectile dysfunction due to arterial insufficiency (N52.01) 5. Gross hematuria (R31.0) 6. Isolated proteinuria (R80.0) 7. Left flank pain (R10.9) 8. Male stress incontinence (N39.3) 9. Microscopic hematuria (R31.2) 10. Nephrolithiasis (N20.0) 11. Prostate cancer (C61)  History of Present Illness Adam Kane had the onset Friday of severe right flank pain with nausea and vomiting. He has a history of stones and had left ureteroscopy. He was seen in the ER on Friday and was found to have a 3mm stone at the right UVJ on CT. He has a history of prostate cancer treated with radical prostatectomy in 2/11. He has increased incontinence with the stone. He is on warfarin. He was also seen to have a 3.3cm AAA and sigmoid diverticular disease.   Past Medical History Problems  1. History of Arthritis 2. History of Atrial fibrillation (I48.91) 3. History of Coronary Artery Disease 4. History of cardiac disorder (Z86.79) 5. History of hypercholesterolemia (Z86.39) 6. History of hypertension (Z86.79) 7. Prostate cancer (C61)  Surgical History Problems  1. History of Back Surgery 2. History of CABG (CABG) 3. History of Cath Stent Placement 4. History of Cholecystectomy 5. History of Cystoscopy With Insertion Of Ureteral Stent Left 6. History of Cystoscopy With Ureteroscopy With Lithotripsy 7. History of Inguinal Hernia Repair 8. History of Laminectomy Lumbar 9. History of Lithotripsy 10. History of Prostatectomy Robotic-Assisted 11. History of Umbilical Hernia Repair  Current Meds 1. Adult Aspirin Low Strength 81 MG TBDP; Therapy: (Recorded:14Dec2010) to Recorded 2. Atorvastatin Calcium 40 MG Oral Tablet; Therapy: 31Jul2012 to Recorded 3. Carvedilol 25 MG Oral Tablet; Therapy: (Recorded:02Oct2012) to Recorded 4. Lovaza 1 GM Oral Capsule; Therapy:  (Recorded:02Oct2012) to Recorded 5. Nabumetone 500 MG Oral Tablet; Therapy: 21Apr2012 to Recorded 6. Omeprazole 20 MG Oral Capsule Delayed Release; Therapy: (Recorded:14Dec2010) to Recorded 7. Tiazac 240 MG/24HR CPCR; Therapy: (Recorded:14Dec2010) to Recorded 8. Tylenol TABS; Therapy: (Recorded:10Sep2015) to Recorded 9. Vicodin ES TABS; Therapy: (Recorded:21Dec2015) to Recorded 10. Vitamin B-12 SOLN;  Therapy: (Recorded:10Sep2015) to Recorded 11. Vitamin B12 TABS;  Therapy: (Recorded:10Sep2015) to Recorded 12. Vitamin D3 CAPS;  Therapy: (Recorded:27Feb2013) to Recorded 13. Warfarin Sodium 2 MG Oral Tablet;  Therapy: 31Oct2013 to Recorded 14. Zetia 10 MG Oral Tablet;  Therapy: (Recorded:14Dec2010) to Recorded 15. Zofran TABS;  Therapy: (Recorded:21Dec2015) to Recorded  Allergies Medication  1. Dilaudid TABS 2. Morphine Sulfate (PF) SOLN  Family History Problems  1. Family history of Breast Cancer : Mother 2. Denied: Family history of Prostate Cancer  Social History Problems  1. Denied: History of Alcohol Use 2. Former smoker (Z87.891) 3. Marital History - Currently Married 4. Occupation:  retired 5. Tobacco use (Z72.0)  1/2 ppd x 30 years =quit 25 years  Past and social history reviewed and updated.   Review of Systems  Genitourinary: feelings of urinary urgency,incontinenceandhematuria.  Gastrointestinal: nauseaandflank pain.  Cardiovascular: no chest painandno leg swelling.  Respiratory: no shortness of breath.   Vitals Vital Signs [Data Includes: Last 1 Day]  Recorded: 21Dec2015 03:32PM  Blood Pressure: 117 / 74 Temperature: 98.1 F Heart Rate: 85 Recorded: 21Dec2015 02:00PM  Blood Pressure: 111 / 76 Temperature: 98.4 F Heart Rate: 99  Physical Exam Constitutional: Well nourishedandwell developed. No acute distress.  Pulmonary: No respiratory distressandnormal respiratory rhythm and effort.  Cardiovascular: Heart  rate and rhythm are normal. No peripheral edema.  Abdomen: The abdomen is not firmandno guarding. Moderate tenderness in the RUQ   is present.   Results/Data Urine [Data Includes: Last 1 Day]   21Dec2015  COLOR AMBER   APPEARANCE CLEAR   SPECIFIC GRAVITY >1.030   pH 6.0   GLUCOSE NEG mg/dL  BILIRUBIN MOD   KETONE TRACE mg/dL  BLOOD LARGE   PROTEIN 100 mg/dL  UROBILINOGEN 1 mg/dL  NITRITE POS   LEUKOCYTE ESTERASE NEG   SQUAMOUS EPITHELIAL/HPF RARE   WBC NONE SEEN WBC/hpf  RBC 11-20 RBC/hpf  BACTERIA MANY   CRYSTALS NONE SEEN   CASTS NONE SEEN    The following images/tracing/specimen were independently visualized: KUB today shows some increased intestinal gas with a possible ileus. The stone is not well seen but there is a shadow over the right pubis which could be the stone. He has right phleboliths. He has no other significant findings. right renal US shows moderate hydro in a 14.54cm kidney with a 2.95cm Bosniak II RLP cyst with a septation. No other stones or masses are seen.  The following clinical lab reports were reviewed: UA reviewed.   Assessment Assessed  1. Calculus of distal right ureter (N20.1)  He has a right distal stone with obstruction and pain and has has some weakness.   Plan Calculus of distal right ureter  1. Follow-up Schedule Surgery Office Follow-up Status: Hold For - Appointment  Requested for: 21Dec2015 2. KUB; Status:Resulted - Requires Verification; Done: 21Dec2015 12:00AM 3. RENAL U/S RIGHT; Status:Resulted - Requires Verification; Done: 21Dec2015 12:00AM 4. URINE CULTURE; Status:Hold For - Specimen/Data Collection,Appointment; Requested for:21Dec2015;  Health Maintenance  5. UA With REFLEX; [Do Not Release]; Status:Resulted - Requires Verification; Done: 21Dec2015 01:49PM  I discussed MET vs ureteroscopy. He is on warfarin but with the distal stone and symptoms, I think it would be  ok to attempt extraction. I have reviewed the risks of bleeding, infection, need for stent or secondary procedures, thrombotic events and anesthetic complications.   Discussion/Summary CC: Dr. Mark Perini and Dr. Mihai Croitoru.     

## 2014-08-22 ENCOUNTER — Encounter (HOSPITAL_COMMUNITY): Payer: Self-pay | Admitting: Urology

## 2014-08-22 DIAGNOSIS — K5730 Diverticulosis of large intestine without perforation or abscess without bleeding: Secondary | ICD-10-CM | POA: Insufficient documentation

## 2014-08-22 DIAGNOSIS — I714 Abdominal aortic aneurysm, without rupture, unspecified: Secondary | ICD-10-CM | POA: Insufficient documentation

## 2014-08-22 DIAGNOSIS — N201 Calculus of ureter: Secondary | ICD-10-CM | POA: Diagnosis not present

## 2014-08-22 DIAGNOSIS — N289 Disorder of kidney and ureter, unspecified: Secondary | ICD-10-CM

## 2014-08-22 LAB — BASIC METABOLIC PANEL
Anion gap: 8 (ref 5–15)
BUN: 31 mg/dL — ABNORMAL HIGH (ref 6–23)
CALCIUM: 9.1 mg/dL (ref 8.4–10.5)
CO2: 27 mmol/L (ref 19–32)
Chloride: 103 mEq/L (ref 96–112)
Creatinine, Ser: 1.42 mg/dL — ABNORMAL HIGH (ref 0.50–1.35)
GFR calc Af Amer: 53 mL/min — ABNORMAL LOW (ref >90)
GFR calc non Af Amer: 45 mL/min — ABNORMAL LOW (ref >90)
GLUCOSE: 131 mg/dL — AB (ref 70–99)
Potassium: 3.4 mmol/L — ABNORMAL LOW (ref 3.5–5.1)
SODIUM: 138 mmol/L (ref 135–145)

## 2014-08-22 LAB — CBC
HEMATOCRIT: 39.8 % (ref 39.0–52.0)
HEMOGLOBIN: 13.1 g/dL (ref 13.0–17.0)
MCH: 30.2 pg (ref 26.0–34.0)
MCHC: 32.9 g/dL (ref 30.0–36.0)
MCV: 91.7 fL (ref 78.0–100.0)
Platelets: 135 10*3/uL — ABNORMAL LOW (ref 150–400)
RBC: 4.34 MIL/uL (ref 4.22–5.81)
RDW: 14.2 % (ref 11.5–15.5)
WBC: 9.1 10*3/uL (ref 4.0–10.5)

## 2014-08-22 LAB — PROTIME-INR
INR: 2.42 — AB (ref 0.00–1.49)
PROTHROMBIN TIME: 26.5 s — AB (ref 11.6–15.2)

## 2014-08-22 NOTE — Op Note (Signed)
NAMEMarland Kitchen  Adam Kane, Adam Kane NO.:  [id number]  MEDICAL RECORD NO.:  [id number]  LOCATION:  31                         FACILITY:  [facility]  PHYSICIAN:  Marshall Cork. Jeffie Pollock, M.D.    DATE OF BIRTH:  1932/12/19  DATE OF PROCEDURE:  08/21/2014 DATE OF DISCHARGE:                              OPERATIVE REPORT   PROCEDURE: 1. Cystoscopy with right retrograde pyelogram and interpretation. 2. Right ureteroscopic stone extraction. 3. Insertion of right double-J stent.  PREOPERATIVE DIAGNOSIS:  Right distal ureteral stone with obstruction.  POSTOPERATIVE DIAGNOSIS:  Right distal ureteral stone with obstruction.  SURGEON:  Marshall Cork. Jeffie Pollock, MD  ANESTHESIA:  General.  SPECIMEN:  Stone.  DRAINS:  A 6-French 28 cm double-J stent on the right.  BLOOD LOSS:  None.  COMPLICATIONS:  None.  INDICATIONS:  Adam Kane is a 80 year old, white male, who was seen a few days ago in the emergency room with right flank pain.  A CT scan revealed 3 to 4 mm right distal ureteral stone.  He has continued to have pain and in the office today, he had persistent hydro on an ultrasound.  The stone was potentially seen overlying the pubis as the patient had a prostatectomy and his bladder base is low.  After reviewing the options, it was elected to proceed with ureteroscopy.  FINDINGS OF PROCEDURE:  He was taken to the operating room where general anesthetic was induced.  He was given 2 g of Ancef and placed in lithotomy position.  He was fitted with PAS hose.  His perineum and genitalia were prepped with Betadine solution.  He was draped in usual sterile fashion.  Cystoscopy was performed using 22-French scope and 12-degree lens. Examination revealed a normal urethra.  The external sphincter was intact, but prostate was absent.  There was no bladder neck contraction. Inspection of the bladder revealed erythema and edema of the right ureteral orifice.  The left ureteral orifice was unremarkable.  He  had some mild-to-moderate trabeculation, but no tumors or stones were identified within the bladder. The right ureteral orifice was cannulated with 5-French opening catheter and contrast was gently instilled.  This revealed a small filling defect in the distal ureter suggestive of the stone.  After the retrograde pyelogram, a Sensor guidewire was passed to the kidney under fluoroscopic guidance through the open-end catheter.  The open-end catheter was then removed and there was brisk efflux of turbid blood urine from above the site of obstruction.  At this point, the bladder was drained and the cystoscope was removed leaving the wire to the kidney.  A 6.4-French short ureteroscope was then inserted alongside the wire into the distal ureter.  The stone was visualized and it was grasped with a Nitinol basket and removed without further manipulation.  At this point, it was felt a stent was indicated and a 6-French 28 cm double-J stent with string was passed to the kidney under fluoroscopic guidance without difficulty.  The wire was removed leaving good curl in the kidney and a good curl in the bladder.  The bladder was drained. The cystoscope was removed leaving the stent string exiting urethra. The string was secured to the patient's penis.  The drapes removed.  He was taken down from lithotomy position.  His anesthetic was reversed. He was moved to recovery room in stable condition.  There were no complications.     Marshall Cork. Jeffie Pollock, M.D.     JJW/MEDQ  D:  08/21/2014  T:  08/22/2014  Job:  [id number]

## 2014-08-22 NOTE — Discharge Summary (Addendum)
Physician Discharge Summary  Patient ID: Adam Kane MRN: [id number] DOB/AGE: 1932/09/02 80 y.o.  Admit date: 08/21/2014 Discharge date: 08/22/2014  Admission Diagnoses:  Right ureteral stone  Discharge Diagnoses:  Principal Problem:   Right ureteral stone Active Problems:   Acute renal insufficiency   Past Medical History  Diagnosis Date  . CAD (coronary artery disease)   . S/P CABG (coronary artery bypass graft) 2001  . Dyslipidemia   . Systemic hypertension   . A-fib   . Chronic kidney disease     kidney stone  . Complication of anesthesia     if given anesthesia too quickly experiences nausea and vomiting   . Dysrhythmia   . Heart murmur     childhood  . Shortness of breath     mild exertion   . Arthritis   . Cancer     prostate cancer 2009  . Diverticulosis of colon without hemorrhage   . Abdominal aortic aneurysm (AAA), 30-34 mm diameter     Surgeries: Procedure(s): CYSTOSCOPY WITH RETROGRADE PYELOGRAM, URETEROSCOPY AND STENT PLACEMENT WITH STONE EXTRACTION basket on 08/21/2014   Consultants (if any):    Discharged Condition: Improved  Hospital Course: Adam Kane is an 80 y.o. male who was admitted 08/21/2014 with a diagnosis of Right ureteral stone with pain and obstruction and went to the operating room on 08/21/2014 and underwent the above named procedures.  He was found to have a small distal stone that was removed and a 6 x 28cm right JJ stent was placed with a string.   He is doing well this morning with relief of pain.  He is tolerating the stent but has some hematuria.  He has mild ARI with a Cr of 1.42,  His INR remains therapeutic.  He is felt to be ready for discharge.   He was given perioperative antibiotics:      Anti-infectives    Start     Dose/Rate Route Frequency Ordered Stop   08/22/14 0400  ceFAZolin (ANCEF) IVPB 1 g/50 mL premix     1 g100 mL/hr over 30 Minutes Intravenous 3 times per day 08/21/14 2121     08/21/14 1711   ceFAZolin (ANCEF) IVPB 2 g/50 mL premix     2 g100 mL/hr over 30 Minutes Intravenous 30 min pre-op 08/21/14 1711 08/21/14 1925    .  He was given sequential compression devices and remains on warfarin for DVT prophylaxis.  He benefited maximally from the hospital stay and there were no complications.    Recent vital signs:  Filed Vitals:   08/22/14 0511  BP: 126/76  Pulse: 93  Temp: 98.1 F (36.7 C)  Resp: 18    Recent laboratory studies:  Lab Results  Component Value Date   HGB 13.1 08/22/2014   HGB 15.6 08/18/2014   HGB 14.0 05/17/2014   Lab Results  Component Value Date   WBC 9.1 08/22/2014   PLT 135* 08/22/2014   Lab Results  Component Value Date   INR 2.42* 08/22/2014   Lab Results  Component Value Date   NA 138 08/22/2014   K 3.4* 08/22/2014   CL 103 08/22/2014   CO2 27 08/22/2014   BUN 31* 08/22/2014   CREATININE 1.42* 08/22/2014   GLUCOSE 131* 08/22/2014    Discharge Medications:     Medication List    TAKE these medications        aspirin EC 81 MG tablet  Take 81 mg by mouth daily.  atorvastatin 40 MG tablet  Commonly known as:  LIPITOR  Take 40 mg by mouth at bedtime.     carvedilol 25 MG tablet  Commonly known as:  COREG  Take 12.5 mg by mouth 2 (two) times daily with a meal. Takes 1/2 tablet twice daily     cephALEXin 500 MG capsule  Commonly known as:  KEFLEX  Take 1 capsule (500 mg total) by mouth 3 (three) times daily.     cholecalciferol 1000 UNITS tablet  Commonly known as:  VITAMIN D  Take 1,000 Units by mouth daily.     diltiazem 240 MG 24 hr capsule  Commonly known as:  TIAZAC  Take 240 mg by mouth every morning.     ezetimibe 10 MG tablet  Commonly known as:  ZETIA  Take 10 mg by mouth every morning.     HYDROcodone-acetaminophen 5-325 MG per tablet  Commonly known as:  NORCO/VICODIN  Take 0.5-1 tablets every 6 hours as needed for severe pain     nabumetone 500 MG tablet  Commonly known as:  RELAFEN  Take 500  mg by mouth daily.     omega-3 acid ethyl esters 1 G capsule  Commonly known as:  LOVAZA  Take 1 g by mouth daily.     omeprazole 20 MG capsule  Commonly known as:  PRILOSEC  Take 20 mg by mouth daily.     ondansetron 8 MG tablet  Commonly known as:  ZOFRAN  Take 4-8 mg by mouth every 8 (eight) hours as needed for nausea or vomiting.     oxyCODONE-acetaminophen 5-325 MG per tablet  Commonly known as:  ROXICET  Take 1 tablet by mouth every 4 (four) hours as needed for severe pain.     phenazopyridine 100 MG tablet  Commonly known as:  PYRIDIUM  Take 1 tablet (100 mg total) by mouth 3 (three) times daily as needed for pain (for burning).     warfarin 2 MG tablet  Commonly known as:  COUMADIN  Take 2 mg by mouth daily at 6 PM. Takes 1 tab daily except takes 1.5 tab on Monday Wednesday and Friday        Diagnostic Studies: Dg Chest 2 View  08/21/2014   CLINICAL DATA:  Preoperative evaluation.  EXAM: CHEST  2 VIEW  COMPARISON:  None.  FINDINGS: Median sternotomy wires of underlying CABG markers again noted. Mild cardiomegaly is stable. Atherosclerotic calcifications present within the aortic arch.  The lungs are normally inflated. Linear opacity within the right lung base consistent with atelectasis. No airspace consolidation, pleural effusion, or pulmonary edema is identified. There is no pneumothorax.  No acute osseous abnormality identified.  IMPRESSION: 1. No active cardiopulmonary disease. 2. Right basilar atelectasis. 3. Sequelae of prior CABG.   Electronically Signed   By: Jeannine Boga M.D.   On: 08/21/2014 18:43   Ct Renal Stone Study  08/18/2014   CLINICAL DATA:  Right flank pain, hematuria, vomiting.  EXAM: CT ABDOMEN AND PELVIS WITHOUT CONTRAST  TECHNIQUE: Multidetector CT imaging of the abdomen and pelvis was performed following the standard protocol without IV contrast.  COMPARISON:  None.  FINDINGS: Lower chest:  Pleural thickening/ fluid at the right lung base.   Hepatobiliary: Numerous hepatic cysts measuring up to 4.3 cm in the medial segment left hepatic dome (series 2/ image 13).  Status post cholecystectomy. No intrahepatic or extrahepatic ductal dilatation.  Pancreas: Within normal limits.  Spleen: Within normal limits.  Adrenals/Urinary Tract: 11 mm  probable right adrenal adenoma (series 2/ image 29).  Left adrenal gland is within normal limits.  Left kidney is within normal limits. Right kidney is notable for a 2.5 cm right lower pole renal cyst (series 2/ image 44). Mild right hydroureteronephrosis.  3 mm distal right ureteral calculus at the UVJ (series 2/ image 93).  Bladder is low lying and underdistended.  Stomach/Bowel: Stomach is notable for a small hiatal hernia.  No evidence of bowel obstruction.  Extensive colonic diverticulosis, without associated inflammatory changes.  Vascular/Lymphatic: Atherosclerotic calcifications of the abdominal aorta and branch vessels. 3.4 x 3.4 cm infrarenal abdominal aortic aneurysm (series 2/ image 50).  No suspicious abdominopelvic lymphadenopathy.  Reproductive: Status post prostatectomy.  Other: No abdominopelvic ascites.  Small fat containing bilateral inguinal hernias.  Musculoskeletal: Degenerative changes of the visualized thoracolumbar spine.  IMPRESSION: 3 mm distal right ureteral calculus at the UVJ. Mild right hydroureteronephrosis.  Additional ancillary findings as above.   Electronically Signed   By: Julian Hy M.D.   On: 08/18/2014 17:51    Disposition: 01-Home or Self Care    Follow-up Information    Follow up with Malka So, MD.   Specialty:  Urology   Why:  call for f/u with Dr. Alinda Money or myself for 1-2 weeks.    Contact information:   [place] [address] [phone]      He will remove his stent on Thursday am and will bring his stone to the office for analysis.  SignedMalka So 08/22/2014, 8:41 AM

## 2014-08-22 NOTE — Progress Notes (Signed)
Discharge instructions reviewed with patient and daughter at bedside. Patient and daughter verbalize understanding and have no questions at this time. Patient confirms he has all personal belongings in his possession. Patient discharged home.

## 2014-08-29 ENCOUNTER — Encounter (HOSPITAL_COMMUNITY): Payer: Self-pay | Admitting: Urology

## 2014-09-06 NOTE — H&P (Signed)
Active Problems Problems  1. Calculus of distal right ureter (N20.1) 2. Calculus of left ureter (N20.1) 3. Dysuria (R30.0) 4. Erectile dysfunction due to arterial insufficiency (N52.01) 5. Gross hematuria (R31.0) 6. Isolated proteinuria (R80.0) 7. Left flank pain (R10.9) 8. Male stress incontinence (N39.3) 9. Microscopic hematuria (R31.2) 10. Nephrolithiasis (N20.0) 11. Prostate cancer (C61)  History of Present Illness Adam Kane had the onset Friday of severe right flank pain with nausea and vomiting. He has a history of stones and had left ureteroscopy. He was seen in the ER on Friday and was found to have a 41m stone at the right UVJ on CT. He has a history of prostate cancer treated with radical prostatectomy in 2/11. He has increased incontinence with the stone. He is on warfarin. He was also seen to have a 3.3cm AAA and sigmoid diverticular disease.   Past Medical History Problems  1. History of Arthritis 2. History of Atrial fibrillation (I48.91) 3. History of Coronary Artery Disease 4. History of cardiac disorder (Z86.79) 5. History of hypercholesterolemia (Z86.39) 6. History of hypertension (Z86.79) 7. Prostate cancer (C61)  Surgical History Problems  1. History of Back Surgery 2. History of CABG (CABG) 3. History of Cath Stent Placement 4. History of Cholecystectomy 5. History of Cystoscopy With Insertion Of Ureteral Stent Left 6. History of Cystoscopy With Ureteroscopy With Lithotripsy 7. History of Inguinal Hernia Repair 8. History of Laminectomy Lumbar 9. History of Lithotripsy 10. History of Prostatectomy Robotic-Assisted 11. History of Umbilical Hernia Repair  Current Meds 1. Adult Aspirin Low Strength 81 MG TBDP; Therapy: (Recorded:14Dec2010) to Recorded 2. Atorvastatin Calcium 40 MG Oral Tablet; Therapy: [id number] Recorded 3. Carvedilol 25 MG Oral Tablet; Therapy: (Recorded:02Oct2012) to Recorded 4. Lovaza 1 GM Oral Capsule; Therapy:  (Recorded:02Oct2012) to Recorded 5. Nabumetone 500 MG Oral Tablet; Therapy: 21Apr2012 to Recorded 6. Omeprazole 20 MG Oral Capsule Delayed Release; Therapy: (Recorded:14Dec2010) to Recorded 7. Tiazac 240 MG/24HR CPCR; Therapy: (Recorded:14Dec2010) to Recorded 8. Tylenol TABS; Therapy: (Recorded:10Sep2015) to Recorded 9. Vicodin ES TABS; Therapy: (Recorded:21Dec2015) to Recorded 10. Vitamin B-12 SOLN;  Therapy: (Recorded:10Sep2015) to Recorded 11. Vitamin B12 TABS;  Therapy: (Recorded:10Sep2015) to Recorded 12. Vitamin D3 CAPS;  Therapy: (Recorded:27Feb2013) to Recorded 13. Warfarin Sodium 2 MG Oral Tablet;  Therapy: 31Oct2013 to Recorded 14. Zetia 10 MG Oral Tablet;  Therapy: (Recorded:14Dec2010) to Recorded 15. Zofran TABS;  Therapy: (Recorded:21Dec2015) to Recorded  Allergies Medication  1. Dilaudid TABS 2. Morphine Sulfate (PF) SOLN  Family History Problems  1. Family history of Breast Cancer : Mother 2. Denied: Family history of Prostate Cancer  Social History Problems  1. Denied: History of Alcohol Use 2. Former smoker [phone] 3. Marital History - Currently Married 4. Occupation:  retired 552 Tobacco use (Z72.0)  1/2 ppd x 30 years =quit 25 years  Past and social history reviewed and updated.   Review of Systems  Genitourinary: feelings of urinary urgency,incontinenceandhematuria.  Gastrointestinal: nauseaandflank pain.  Cardiovascular: no chest painandno leg swelling.  Respiratory: no shortness of breath.   Vitals Vital Signs [Data Includes: Last 1 Day]  Recorded: 21Dec2015 03:32PM  Blood Pressure: 117 / 74 Temperature: 98.1 F Heart Rate: 85 Recorded: 21Dec2015 02:00PM  Blood Pressure: 111 / 76 Temperature: 98.4 F Heart Rate: 99  Physical Exam Constitutional: Well nourishedandwell developed. No acute distress.  Pulmonary: No respiratory distressandnormal respiratory rhythm and effort.  Cardiovascular: Heart  rate and rhythm are normal. No peripheral edema.  Abdomen: The abdomen is not firmandno guarding. Moderate tenderness in the RUQ  is present.   Results/Data Urine [Data Includes: Last 1 Day]   [id number]  COLOR AMBER   APPEARANCE CLEAR   SPECIFIC GRAVITY >1.030   pH 6.0   GLUCOSE NEG mg/dL  BILIRUBIN MOD   KETONE TRACE mg/dL  BLOOD LARGE   PROTEIN 100 mg/dL  UROBILINOGEN 1 mg/dL  NITRITE POS   LEUKOCYTE ESTERASE NEG   SQUAMOUS EPITHELIAL/HPF RARE   WBC NONE SEEN WBC/hpf  RBC 11-20 RBC/hpf  BACTERIA MANY   CRYSTALS NONE SEEN   CASTS NONE SEEN    The following images/tracing/specimen were independently visualized: KUB today shows some increased intestinal gas with a possible ileus. The stone is not well seen but there is a shadow over the right pubis which could be the stone. He has right phleboliths. He has no other significant findings. right renal US shows moderate hydro in a 14.54cm kidney with a 2.95cm Bosniak II RLP cyst with a septation. No other stones or masses are seen.  The following clinical lab reports were reviewed: UA reviewed.   Assessment Assessed  1. Calculus of distal right ureter (N20.1)  He has a right distal stone with obstruction and pain and has has some weakness.   Plan Calculus of distal right ureter  1. Follow-up Schedule Surgery Office Follow-up Status: Hold For - Appointment  Requested for: [phone] 2. KUB; Status:Resulted - Requires Verification; Done: [id number] 12:00AM 3. RENAL U/S RIGHT; Status:Resulted - Requires Verification; Done: [id number] 12:00AM 4. URINE CULTURE; Status:Hold For - Specimen/Data Collection,Appointment; Requested for:21Dec2015;  Health Maintenance  5. UA With REFLEX; [Do Not Release]; Status:Resulted - Requires Verification; Done: [id number] 01:49PM  I discussed MET vs ureteroscopy. He is on warfarin but with the distal stone and symptoms, I think it would be  ok to attempt extraction. I have reviewed the risks of bleeding, infection, need for stent or secondary procedures, thrombotic events and anesthetic complications.   Discussion/Summary CC: Dr. Crist Infante and Dr. Sanda Klein.

## 2014-10-02 DEATH — deceased

## 2015-06-22 ENCOUNTER — Encounter: Payer: Self-pay | Admitting: Internal Medicine

## 2016-01-26 IMAGING — CT CT RENAL STONE PROTOCOL
1 series · 15 of 30 positions shown, 19 images · non-contrast
Comparison: None.

CLINICAL DATA: Right flank pain, hematuria, vomiting.

EXAM:
CT ABDOMEN AND PELVIS WITHOUT CONTRAST
TECHNIQUE: Multidetector CT imaging of the abdomen and pelvis was performed
following the standard protocol without IV contrast.

[Series 6: lung · axial · 0.86mm/px · z∈[+1550,+1680]mm · 15 of 30 slices shown, 19 images]
[im 3/30  soft-tissue]
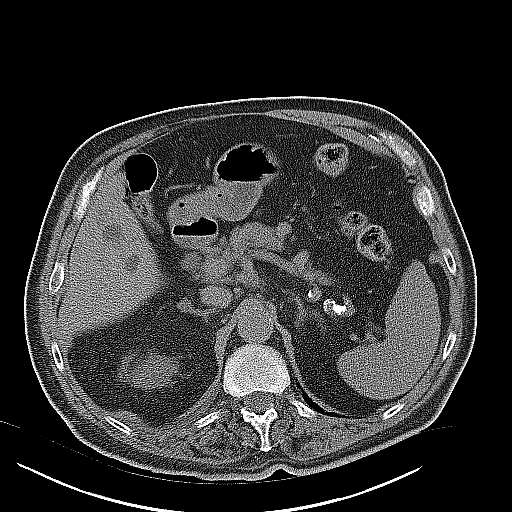
[im 3/30  bone]
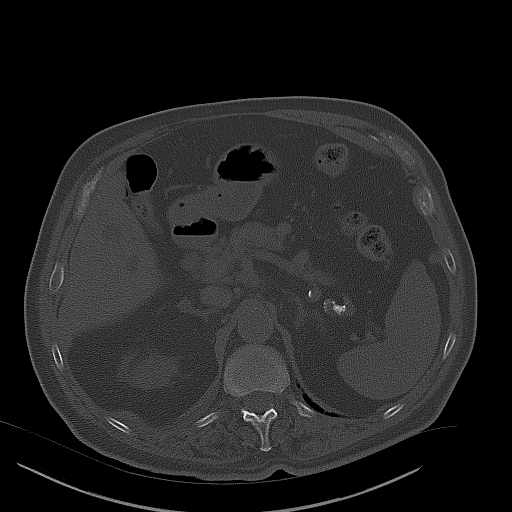
[im 5/30  soft-tissue]
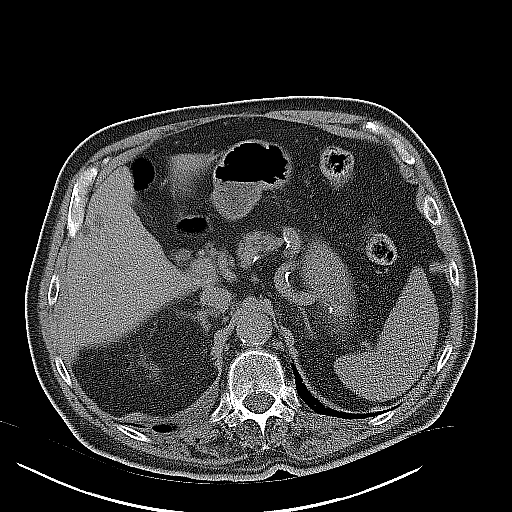
[im 7/30  soft-tissue]
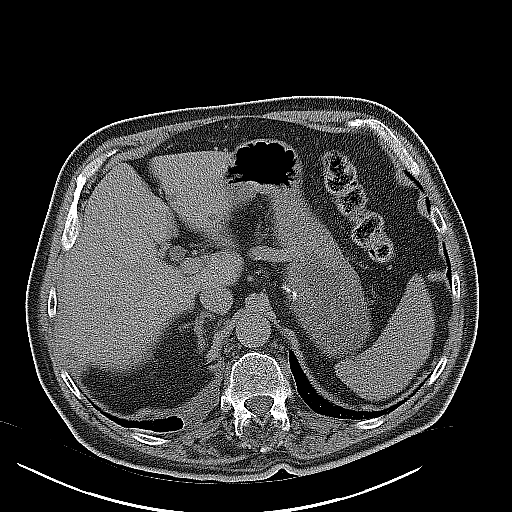
[im 9/30  soft-tissue]
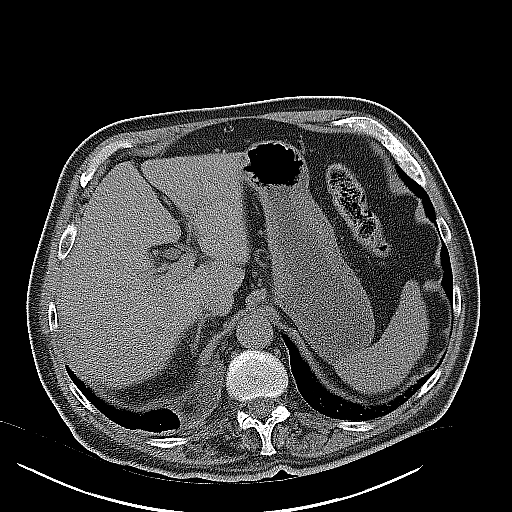
[im 11/30  soft-tissue]
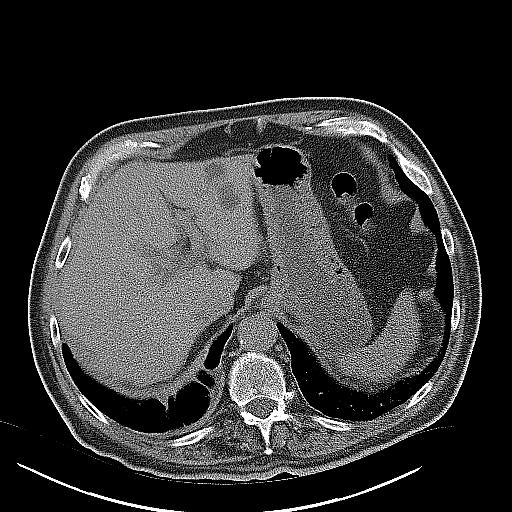
[im 13/30  soft-tissue]
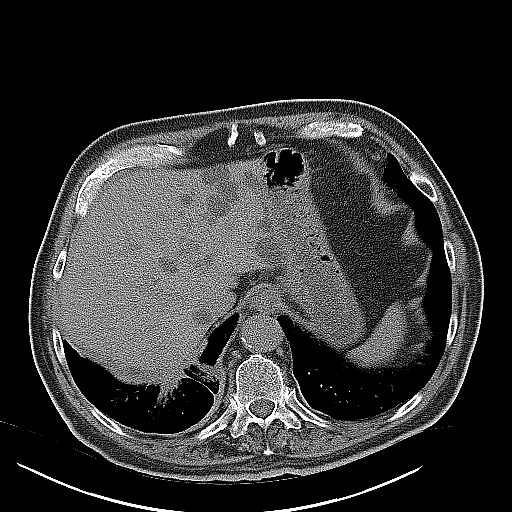
[im 16/30  soft-tissue]
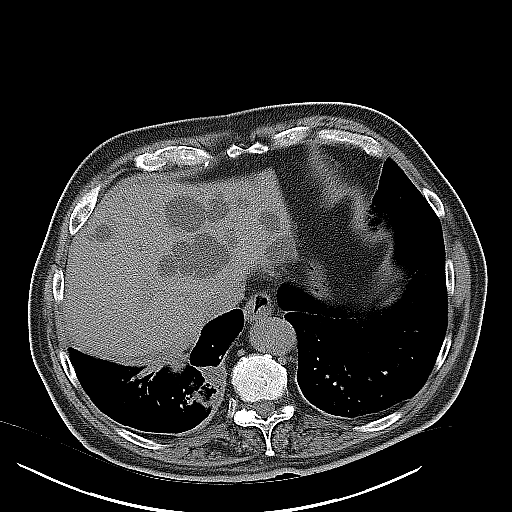
[im 18/30  soft-tissue]
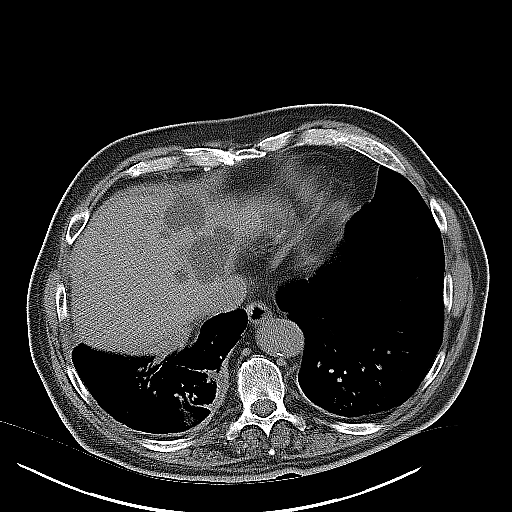
[im 20/30  soft-tissue]
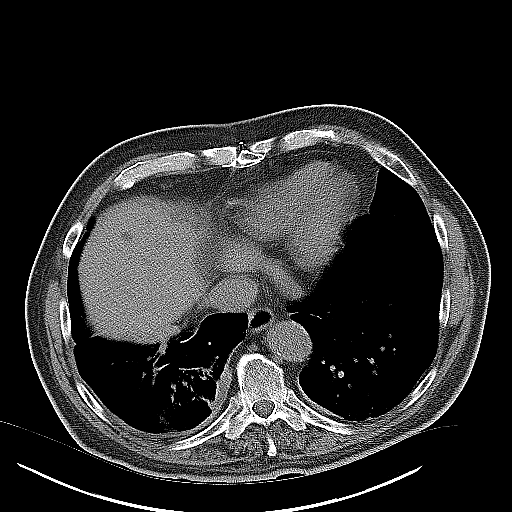
[im 20/30  bone]
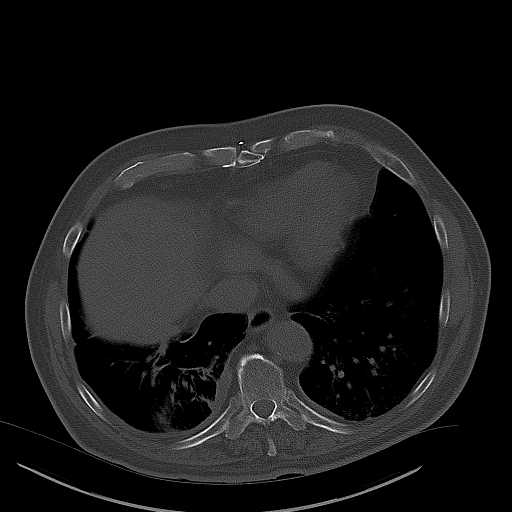
[im 22/30  soft-tissue]
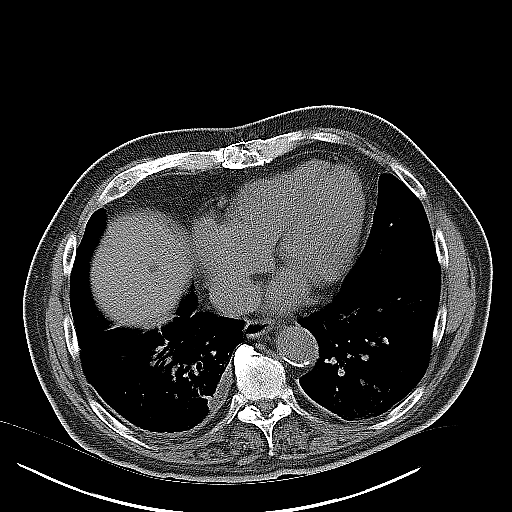
[im 24/30  soft-tissue]
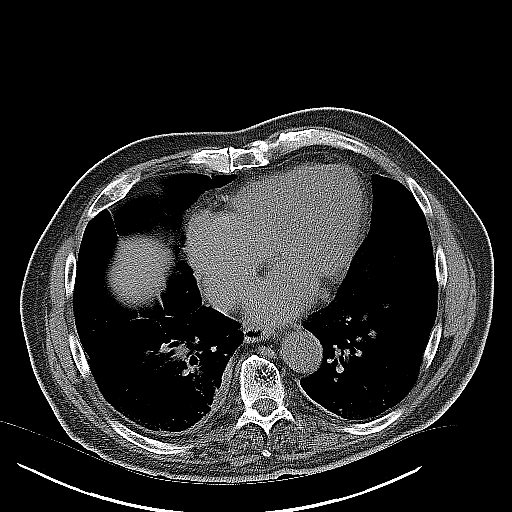
[im 26/30  soft-tissue]
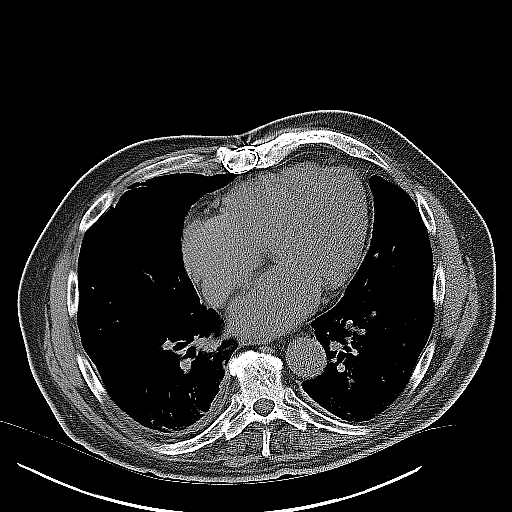
[im 26/30  lung]
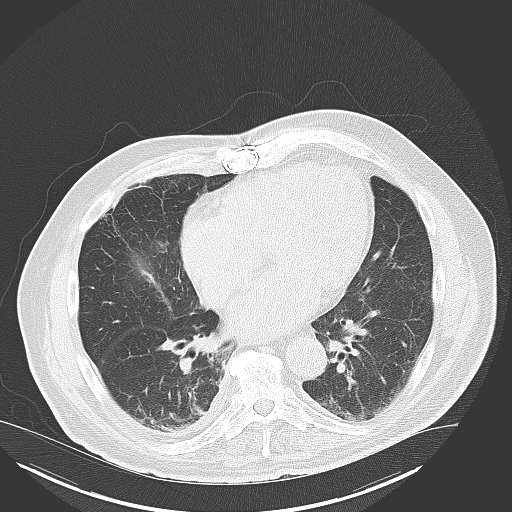
[im 27/30  lung]
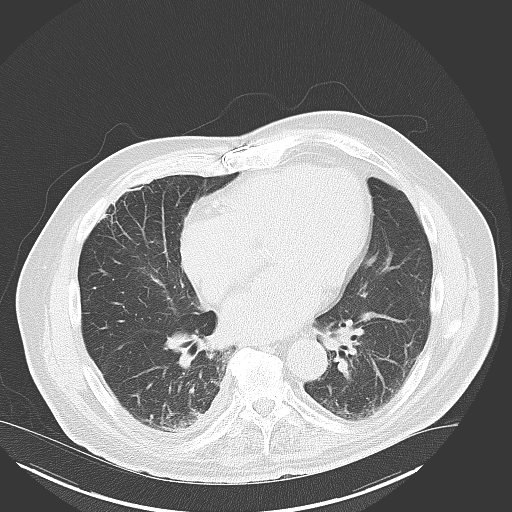
[im 28/30  soft-tissue]
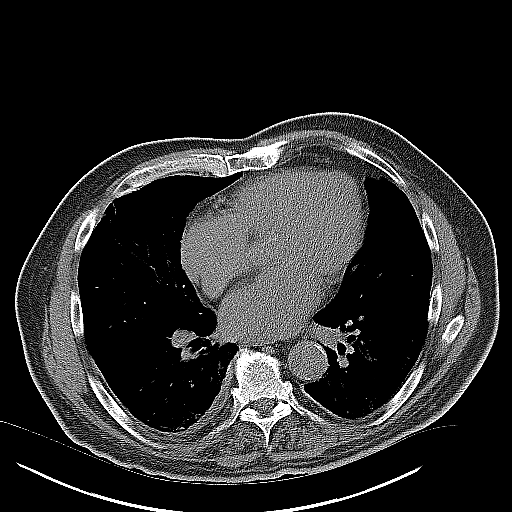
[im 28/30  lung]
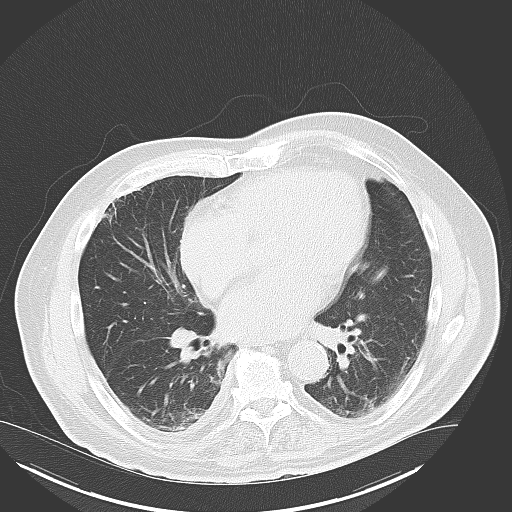
[im 29/30  lung]
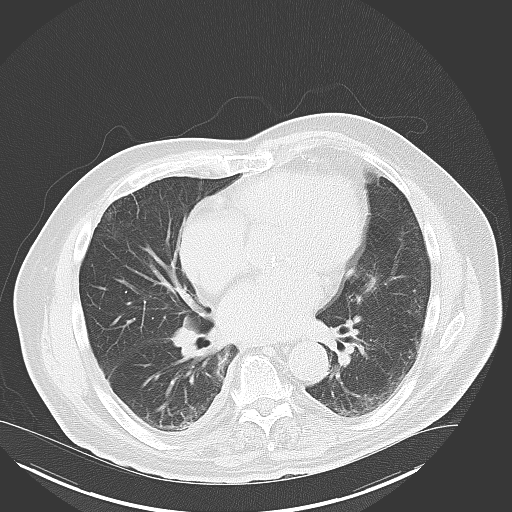

[15 of 30 positions shown; findings below may reference images not displayed]

FINDINGS: Lower chest:  Pleural thickening/ fluid at the right lung base.

Hepatobiliary: Numerous hepatic cysts measuring up to 4.3 cm in the
medial segment left hepatic dome (series 2/ image 13).

Status post cholecystectomy. No intrahepatic or extrahepatic ductal
dilatation.

Pancreas: Within normal limits.

Spleen: Within normal limits.

Adrenals/Urinary Tract: 11 mm probable right adrenal adenoma (series
2/ image 29).

Left adrenal gland is within normal limits.

Left kidney is within normal limits. Right kidney is notable for a
2.5 cm right lower pole renal cyst (series 2/ image 44). Mild right
hydroureteronephrosis.

3 mm distal right ureteral calculus at the UVJ (series 2/ image 93).

Bladder is low lying and underdistended.

Stomach/Bowel: Stomach is notable for a small hiatal hernia.

No evidence of bowel obstruction.

Extensive colonic diverticulosis, without associated inflammatory
changes.

Vascular/Lymphatic: Atherosclerotic calcifications of the abdominal
aorta and branch vessels. 3.4 x 3.4 cm infrarenal abdominal aortic
aneurysm (series 2/ image 50).

No suspicious abdominopelvic lymphadenopathy.

Reproductive: Status post prostatectomy.

Other: No abdominopelvic ascites.

Small fat containing bilateral inguinal hernias.

Musculoskeletal: Degenerative changes of the visualized
thoracolumbar spine.
IMPRESSION: 3 mm distal right ureteral calculus at the UVJ. Mild right
hydroureteronephrosis.

Additional ancillary findings as above.

## 2016-05-02 ENCOUNTER — Encounter: Payer: Self-pay | Admitting: Cardiovascular Disease

## 2016-06-06 ENCOUNTER — Ambulatory Visit (INDEPENDENT_AMBULATORY_CARE_PROVIDER_SITE_OTHER): Payer: Medicare Other | Admitting: Cardiovascular Disease

## 2016-06-06 ENCOUNTER — Encounter: Payer: Self-pay | Admitting: Cardiovascular Disease

## 2016-06-06 VITALS — BP 140/82 | HR 98 | Ht 77.0 in | Wt 247.0 lb

## 2016-06-06 DIAGNOSIS — I482 Chronic atrial fibrillation, unspecified: Secondary | ICD-10-CM

## 2016-06-06 DIAGNOSIS — R0602 Shortness of breath: Secondary | ICD-10-CM | POA: Diagnosis not present

## 2016-06-06 MED ORDER — METOPROLOL TARTRATE 50 MG PO TABS: 50.0000 mg | ORAL_TABLET | Freq: Two times a day (BID) | ORAL | 3 refills | Status: DC

## 2016-06-06 NOTE — Progress Notes (Signed)
Cardiology Consultation Note    Date:  06/07/2016   ID:  KILIAN HEWITSON, DOB 1933/03/07, MRN [id number]  PCP:  Jerlyn Ly, MD  Cardiologist:   Sanda Klein, MD  Consultation requested for: Exertional dyspnea, atrial fibrillation  Chief Complaint  Patient presents with  . Dyspnea    History of Present Illness:  Adam Kane is a 82 y.o. male with chronic atrial fibrillation (recurrent after multiple attempts at cardioversion in the past), hypertension and hyperlipidemia, coronary artery disease s/p CABG 2001 LIMA-LAD, SVG-PDA), and small abdominal aortic aneurysm here for his first cardiology evaluation in over 3 years.  Adam Kane thinks he is doing pretty well, but his daughter has noticed that he becomes easily short of breath getting in and out of a car or even walking on level ground. He has not had leg edema and denies angina, palpitations, syncope, focal neurological complaints or bleeding. Roughly one year ago he had issues with orthostatic dizziness and Dr. Joylene Draft reduced his carvedilol dose in half. Adam Kane is intermittently aware of palpitations and thinks that he is going in and out of atrial fibrillation.  Left ventricular systolic function was normal by his most recent echo performed in [place] in 2012. That same study showed a dilated left atrium with a diameter 5.9 cm and moderate LVH (wall thickness 1.4-1.5 m).  He has frequent problems with nephrolithiasis and has previously undergone lithotripsy. He reports recently having pulmonary function test that showed excellent results.  Past Medical History:  Diagnosis Date  . A-fib ([place])   . Abdominal aortic aneurysm (AAA), 30-34 mm diameter ([facility])   . Arthritis   . CAD (coronary artery disease)   . Cancer [facility])    prostate cancer 2009  . Chronic kidney disease    kidney stone  . Complication of anesthesia    if given anesthesia too quickly experiences nausea and vomiting   . Diverticulosis of colon  without hemorrhage   . Dyslipidemia   . Dysrhythmia   . Heart murmur    childhood  . S/P CABG (coronary artery bypass graft) 2001  . Shortness of breath    mild exertion   . Systemic hypertension     Past Surgical History:  Procedure Laterality Date  . APPENDECTOMY    . CARDIAC CATHETERIZATION  02/20/94  . CHOLECYSTECTOMY    . CORONARY ARTERY BYPASS GRAFT  2001   LIMA to LAD,SVG to PDA  . CORONARY ARTERY BYPASS GRAFT     2 vessel 2001  . CYSTOSCOPY WITH RETROGRADE PYELOGRAM, URETEROSCOPY AND STENT PLACEMENT Left 05/18/2014   Procedure: CYSTOSCOPY WITH RETROGRADE PYELOGRAM, URETEROSCOPY AND STENT PLACEMENT;  Surgeon: Raynelle Bring, MD;  Location: [facility];  Service: Urology;  Laterality: Left;  . CYSTOSCOPY WITH RETROGRADE PYELOGRAM, URETEROSCOPY AND STENT PLACEMENT Right 08/21/2014   Procedure: CYSTOSCOPY WITH RETROGRADE PYELOGRAM, URETEROSCOPY AND STENT PLACEMENT WITH STONE EXTRACTION basket;  Surgeon: Malka So, MD;  Location: [facility];  Service: Urology;  Laterality: Right;  . EYE SURGERY     right eye cataract surgery   . HERNIA REPAIR    . HOLMIUM LASER APPLICATION Left [id number]   Procedure: HOLMIUM LASER APPLICATION;  Surgeon: Raynelle Bring, MD;  Location: [facility];  Service: Urology;  Laterality: Left;  . LUMBAR LAMINECTOMY/DECOMPRESSION MICRODISCECTOMY Left 03/07/2013   Procedure: LUMBAR FOUR-FIVE EXTRAFORAMINAL LUMBAR LAMINECTOMY/DECOMPRESSION MICRODISCECTOMY 1 LEVEL;  Surgeon: Charlie Pitter, MD;  Location: [facility];  Service: Neurosurgery;  Laterality: Left;  Left lumbar four-five extraforaminal microdiskectomy  .  PROSTATECTOMY    . TONSILLECTOMY      Current Medications: Outpatient Medications Prior to Visit  Medication Sig Dispense Refill  . aspirin EC 81 MG tablet Take 81 mg by mouth daily.    Marland Kitchen atorvastatin (LIPITOR) 40 MG tablet Take 40 mg by mouth at bedtime.     . cholecalciferol (VITAMIN D) 1000 UNITS tablet Take 1,000 Units by mouth daily.    Marland Kitchen diltiazem  (TIAZAC) 240 MG 24 hr capsule Take 240 mg by mouth every morning.     . ezetimibe (ZETIA) 10 MG tablet Take 10 mg by mouth every morning.     Marland Kitchen omeprazole (PRILOSEC) 20 MG capsule Take 20 mg by mouth daily.     Marland Kitchen warfarin (COUMADIN) 2 MG tablet Take 2 mg by mouth daily at 6 PM. Takes 1 tab daily except takes 1.5 tab on Monday Wednesday and Friday    . carvedilol (COREG) 25 MG tablet Take 12.5 mg by mouth 2 (two) times daily with a meal. Takes 1/2 tablet twice daily    . cephALEXin (KEFLEX) 500 MG capsule Take 1 capsule (500 mg total) by mouth 3 (three) times daily. (Patient not taking: Reported on 06/06/2016) 15 capsule 0  . HYDROcodone-acetaminophen (NORCO/VICODIN) 5-325 MG per tablet Take 0.5-1 tablets every 6 hours as needed for severe pain (Patient not taking: Reported on 06/06/2016) 15 tablet 0  . nabumetone (RELAFEN) 500 MG tablet Take 500 mg by mouth daily.     Marland Kitchen omega-3 acid ethyl esters (LOVAZA) 1 G capsule Take 1 g by mouth daily.     . ondansetron (ZOFRAN) 8 MG tablet Take 4-8 mg by mouth every 8 (eight) hours as needed for nausea or vomiting.    Marland Kitchen oxyCODONE-acetaminophen (ROXICET) 5-325 MG per tablet Take 1 tablet by mouth every 4 (four) hours as needed for severe pain. (Patient not taking: Reported on 06/06/2016) 30 tablet 0  . phenazopyridine (PYRIDIUM) 100 MG tablet Take 1 tablet (100 mg total) by mouth 3 (three) times daily as needed for pain (for burning). (Patient not taking: Reported on 06/06/2016) 20 tablet 0   No facility-administered medications prior to visit.      Allergies:   Dilaudid [hydromorphone hcl] and Morphine and related   Social History   Social History  . Marital status: Married    Spouse name: N/A  . Number of children: N/A  . Years of education: N/A   Social History Main Topics  . Smoking status: Former Smoker    Types: Cigarettes    Quit date: 09/01/1973  . Smokeless tobacco: Never Used  . Alcohol use No  . Drug use: No  . Sexual activity: Not Asked    Other Topics Concern  . None   Social History Narrative  . None     Family History:  The patient's family history includes Cancer in his mother.   ROS:   Please see the history of present illness.    ROS All other systems reviewed and are negative.   PHYSICAL EXAM:   VS:  BP 140/82 (BP Location: Right Arm, Patient Position: Sitting, Cuff Size: Normal)   Pulse 98   Ht 6\' 5"  (1.956 m)   Wt 247 lb (112 kg)   SpO2 98%   BMI 29.29 kg/m    GEN: Well nourished, well developed, in no acute distress  HEENT: normal  Neck: no JVD, carotid bruits, or masses Cardiac: irregular; no murmurs, rubs, or gallops,no edema  Respiratory:  clear to auscultation  bilaterally, normal work of breathing GI: soft, nontender, nondistended, + BS MS: no deformity or atrophy  Skin: warm and dry, no rash Neuro:  Alert and Oriented x 3, Strength and sensation are intact Psych: euthymic mood, full affect  Wt Readings from Last 3 Encounters:  06/06/16 247 lb (112 kg)  08/21/14 245 lb 6 oz (111.3 kg)  05/18/14 243 lb (110.2 kg)      Studies/Labs Reviewed:   EKG:  EKG is ordered today.  The ekg ordered today demonstrates Atrial fibrillation with borderline control ventricular rate   ASSESSMENT:    1. Chronic a-fib ([place])   2. Shortness of breath      PLAN:  In order of problems listed above:  1. AFib: It would be very surprising if Adam Kane still had periods of normal rhythm. He is almost certainly in permanent atrial fibrillation in view of the severe dilatation of his left atrium and the long-standing nature of the arrhythmia. I suspect that when he feels palpitations it is due to rapid ventricular response. He is appropriately anticoagulated. CHADSVasc 3 (age 74, HTN). 2. Dyspnea: This could simply represent poor ventricular rate control during activity. We'll switch from carvedilol to metoprolol, which should provide rate control for a lesser amount of blood pressure reduction. We'll  repeat an echocardiogram to make sure that he has not developed ventricular dysfunction from tachycardia. 3. HTN: Well controlled. His dose of carvedilol had to be decreased due to orthostatic hypotension not long ago. 4. HLP: On statin/zetia, we'll request labs from Dr. Joylene Draft.  If his echocardiogram shows normal findings, will reevaluate symptoms after several weeks of switching to metoprolol. Left ventricular systolic function is abnormal will bring him in for an earlier appointment.  Medication Adjustments/Labs and Tests Ordered: Current medicines are reviewed at length with the patient today.  Concerns regarding medicines are outlined above.  Medication changes, Labs and Tests ordered today are listed in the Patient Instructions below. Patient Instructions  Medication Instructions: Dr Sallyanne Kuster has recommended making the following medication changes: 1. STOP Carvedilol 2. START Metoprolol tartrate 50 mg - take 1 tablet by mouth twice daily  Labwork: NONE ORDERED  Testing/Procedures: 1. Echocardiogram - Your physician has requested that you have an echocardiogram. Echocardiography is a painless test that uses sound waves to create images of your heart. It provides your doctor with information about the size and shape of your heart and how well your heart's chambers and valves are working. This procedure takes approximately one hour. There are no restrictions for this procedure. This will be performed at our [facility] location - [address].  Follow-up: Dr Sallyanne Kuster recommends that you schedule a follow-up appointment in 2-3 months.  If you need a refill on your cardiac medications before your next appointment, please call your pharmacy.    Signed, Sanda Klein, MD  06/07/2016 9:57 AM    [facility] [address], [place], [place]  [address] Phone: [phone]; Fax: [phone]

## 2016-06-06 NOTE — Patient Instructions (Signed)
Medication Instructions: Dr Sallyanne Kuster has recommended making the following medication changes: 1. STOP Carvedilol 2. START Metoprolol tartrate 50 mg - take 1 tablet by mouth twice daily  Labwork: NONE ORDERED  Testing/Procedures: 1. Echocardiogram - Your physician has requested that you have an echocardiogram. Echocardiography is a painless test that uses sound waves to create images of your heart. It provides your doctor with information about the size and shape of your heart and how well your heart's chambers and valves are working. This procedure takes approximately one hour. There are no restrictions for this procedure. This will be performed at our [facility] location - [address].  Follow-up: Dr Sallyanne Kuster recommends that you schedule a follow-up appointment in 2-3 months.  If you need a refill on your cardiac medications before your next appointment, please call your pharmacy.

## 2016-06-26 ENCOUNTER — Ambulatory Visit: Payer: Self-pay | Admitting: Neurology

## 2016-07-04 ENCOUNTER — Other Ambulatory Visit: Payer: Self-pay

## 2016-07-04 ENCOUNTER — Ambulatory Visit (HOSPITAL_COMMUNITY): Payer: Medicare Other | Attending: Cardiovascular Disease

## 2016-07-04 DIAGNOSIS — I482 Chronic atrial fibrillation, unspecified: Secondary | ICD-10-CM

## 2016-07-04 DIAGNOSIS — I10 Essential (primary) hypertension: Secondary | ICD-10-CM | POA: Insufficient documentation

## 2016-07-04 DIAGNOSIS — R0602 Shortness of breath: Secondary | ICD-10-CM

## 2016-07-04 DIAGNOSIS — Z951 Presence of aortocoronary bypass graft: Secondary | ICD-10-CM | POA: Diagnosis not present

## 2016-07-04 DIAGNOSIS — E785 Hyperlipidemia, unspecified: Secondary | ICD-10-CM | POA: Insufficient documentation

## 2016-07-09 ENCOUNTER — Telehealth: Payer: Self-pay | Admitting: Cardiovascular Disease

## 2016-07-09 NOTE — Telephone Encounter (Signed)
Daughter is in conference in [place] can only call when she gets break. If you want you can leave a detailed message on her voicemail.

## 2016-07-09 NOTE — Telephone Encounter (Signed)
Daughter, Marland Mcalpine, returned call. Verified DPR. Communicated results/recommendations of echocardiogram with daughter. Daughter verbalized understanding and agreed with plan.  Daughter is currently out of town. Will begin daily BP readings and increase metoprolol to 75 mg daily upon return tomorrow.

## 2016-07-11 ENCOUNTER — Ambulatory Visit: Payer: Self-pay | Admitting: Neurology

## 2016-07-31 ENCOUNTER — Telehealth: Payer: Self-pay | Admitting: Cardiovascular Disease

## 2016-07-31 NOTE — Telephone Encounter (Signed)
Adam Kane is returning your call . Please call

## 2016-08-05 NOTE — Telephone Encounter (Signed)
Returned call to patient's daughter.  Recent medication change and recommendations (per echo) - increase metoprolol to 75 mg BID and check daily BP. Daughter states patient has been taking daily BP. Pressures running 114-141/67-83. Heart rate stays in the low to mid 80s. Patient had reports frequent headaches (back of head and neck) since medication increase but has had some relief with a new pillow. Patient has an appointment on Friday, 08/15/16.

## 2016-08-05 NOTE — Telephone Encounter (Signed)
Left detailed vm on daughter's cell.

## 2016-08-05 NOTE — Telephone Encounter (Signed)
BP sounds great. Let's stick with these meds until follow up. Hopefully, HA will improve gradually MCr

## 2016-08-15 ENCOUNTER — Ambulatory Visit (INDEPENDENT_AMBULATORY_CARE_PROVIDER_SITE_OTHER): Payer: Medicare Other | Admitting: Cardiovascular Disease

## 2016-08-15 ENCOUNTER — Encounter: Payer: Self-pay | Admitting: Cardiovascular Disease

## 2016-08-15 VITALS — BP 121/81 | HR 88 | Ht 77.0 in | Wt 248.6 lb

## 2016-08-15 DIAGNOSIS — I482 Chronic atrial fibrillation, unspecified: Secondary | ICD-10-CM

## 2016-08-15 DIAGNOSIS — E7800 Pure hypercholesterolemia, unspecified: Secondary | ICD-10-CM | POA: Diagnosis not present

## 2016-08-15 DIAGNOSIS — I10 Essential (primary) hypertension: Secondary | ICD-10-CM | POA: Diagnosis not present

## 2016-08-15 MED ORDER — METOPROLOL TARTRATE 50 MG PO TABS: ORAL_TABLET | ORAL | 3 refills | Status: DC

## 2016-08-15 NOTE — Patient Instructions (Signed)
Dr Sallyanne Kuster has recommended making the following medication changes: 1. DECREASE Metoprolol - take 1 tablet (50 mg total) by mouth every morning and take 1.5 tablets (75 mg total) by mouth every evening.  Your physician recommends that you schedule a follow-up appointment in 6 months. You will receive a reminder letter in the mail two months in advance. If you don't receive a letter, please call our office to schedule the follow-up appointment.  If you need a refill on your cardiac medications before your next appointment, please call your pharmacy.

## 2016-08-15 NOTE — Progress Notes (Signed)
Cardiology Consultation Note    Date:  08/16/2016   ID:  Adam Kane, DOB 01-Feb-1933, MRN [id number]  PCP:  Jerlyn Ly, MD  Cardiologist:   Sanda Klein, MD  Consultation requested for: Exertional dyspnea, atrial fibrillation  Chief Complaint  Patient presents with  . Dyspnea    History of Present Illness:  Adam Kane is a 82 y.o. male with chronic atrial fibrillation (recurrent after multiple attempts at cardioversion in the past), hypertension and hyperlipidemia, coronary artery disease s/p CABG 2001 LIMA-LAD, SVG-PDA), and small abdominal aortic aneurysm.  He returns for evaluation of dyspnea. His echocardiogram shows that left ventricular systolic function is well-preserved, but during the study his heart rate was consistently elevated at around 120 bpm. I recommended increasing the dose of metoprolol. This has led to improvement in dyspnea but has caused sleepiness and fatigue during the day. Adam Kane is only intermittently aware of palpitations.  He has frequent problems with nephrolithiasis and has previously undergone lithotripsy. He reports recently having pulmonary function test that showed excellent results.  Past Medical History:  Diagnosis Date  . A-fib ([place])   . Abdominal aortic aneurysm (AAA), 30-34 mm diameter (HCC)   . Arthritis   . CAD (coronary artery disease)   . Cancer [facility])    prostate cancer 2009  . Chronic kidney disease    kidney stone  . Complication of anesthesia    if given anesthesia too quickly experiences nausea and vomiting   . Diverticulosis of colon without hemorrhage   . Dyslipidemia   . Dysrhythmia   . Heart murmur    childhood  . S/P CABG (coronary artery bypass graft) 2001  . Shortness of breath    mild exertion   . Systemic hypertension     Past Surgical History:  Procedure Laterality Date  . APPENDECTOMY    . CARDIAC CATHETERIZATION  02/20/94  . CHOLECYSTECTOMY    . CORONARY ARTERY BYPASS GRAFT  2001   LIMA to LAD,SVG to PDA  . CORONARY ARTERY BYPASS GRAFT     2 vessel 2001  . CYSTOSCOPY WITH RETROGRADE PYELOGRAM, URETEROSCOPY AND STENT PLACEMENT Left 05/18/2014   Procedure: CYSTOSCOPY WITH RETROGRADE PYELOGRAM, URETEROSCOPY AND STENT PLACEMENT;  Surgeon: Raynelle Bring, MD;  Location: [facility];  Service: Urology;  Laterality: Left;  . CYSTOSCOPY WITH RETROGRADE PYELOGRAM, URETEROSCOPY AND STENT PLACEMENT Right 08/21/2014   Procedure: CYSTOSCOPY WITH RETROGRADE PYELOGRAM, URETEROSCOPY AND STENT PLACEMENT WITH STONE EXTRACTION basket;  Surgeon: Malka So, MD;  Location: [facility];  Service: Urology;  Laterality: Right;  . EYE SURGERY     right eye cataract surgery   . HERNIA REPAIR    . HOLMIUM LASER APPLICATION Left [id number]   Procedure: HOLMIUM LASER APPLICATION;  Surgeon: Raynelle Bring, MD;  Location: [facility];  Service: Urology;  Laterality: Left;  . LUMBAR LAMINECTOMY/DECOMPRESSION MICRODISCECTOMY Left 03/07/2013   Procedure: LUMBAR FOUR-FIVE EXTRAFORAMINAL LUMBAR LAMINECTOMY/DECOMPRESSION MICRODISCECTOMY 1 LEVEL;  Surgeon: Charlie Pitter, MD;  Location: [place] [facility];  Service: Neurosurgery;  Laterality: Left;  Left lumbar four-five extraforaminal microdiskectomy  . PROSTATECTOMY    . TONSILLECTOMY      Current Medications: Outpatient Medications Prior to Visit  Medication Sig Dispense Refill  . aspirin EC 81 MG tablet Take 81 mg by mouth daily.    Marland Kitchen atorvastatin (LIPITOR) 40 MG tablet Take 40 mg by mouth at bedtime.     . cholecalciferol (VITAMIN D) 1000 UNITS tablet Take 1,000 Units by mouth daily.    Marland Kitchen  diltiazem (TIAZAC) 240 MG 24 hr capsule Take 240 mg by mouth every morning.     . ezetimibe (ZETIA) 10 MG tablet Take 10 mg by mouth every morning.     Marland Kitchen omeprazole (PRILOSEC) 20 MG capsule Take 20 mg by mouth daily.     Marland Kitchen warfarin (COUMADIN) 2 MG tablet Take 2 mg by mouth daily at 6 PM. Takes 1 tab daily except takes 1.5 tab on Monday Wednesday and Friday    . metoprolol (LOPRESSOR) 50 MG  tablet Take 1 tablet (50 mg total) by mouth 2 (two) times daily. 180 tablet 3   No facility-administered medications prior to visit.      Allergies:   Dilaudid [hydromorphone hcl] and Morphine and related   Social History   Social History  . Marital status: Married    Spouse name: N/A  . Number of children: N/A  . Years of education: N/A   Social History Main Topics  . Smoking status: Former Smoker    Types: Cigarettes    Quit date: 09/01/1973  . Smokeless tobacco: Never Used  . Alcohol use No  . Drug use: No  . Sexual activity: Not Asked   Other Topics Concern  . None   Social History Narrative  . None     Family History:  The patient's family history includes Cancer in his mother.   ROS:   Please see the history of present illness.    ROS All other systems reviewed and are negative.   PHYSICAL EXAM:   VS:  BP 121/81   Pulse 88   Ht 6\' 5"  (1.956 m)   Wt 248 lb 9.6 oz (112.8 kg)   SpO2 96%   BMI 29.48 kg/m    GEN: Well nourished, well developed, in no acute distress  HEENT: normal  Neck: no JVD, carotid bruits, or masses Cardiac: irregular; no murmurs, rubs, or gallops,no edema  Respiratory:  clear to auscultation bilaterally, normal work of breathing GI: soft, nontender, nondistended, + BS MS: no deformity or atrophy  Skin: warm and dry, no rash Neuro:  Alert and Oriented x 3, Strength and sensation are intact Psych: euthymic mood, full affect  Wt Readings from Last 3 Encounters:  08/15/16 248 lb 9.6 oz (112.8 kg)  06/06/16 247 lb (112 kg)  08/21/14 245 lb 6 oz (111.3 kg)      Studies/Labs Reviewed:   LABS:  From [place], May 02 2016  Cholesterol 138, triglycerides 112, HDL 34, LDL 82 Creatinine 1.0, hemoglobin 16.3, normal electrolytes and liver function tests  ASSESSMENT:    1. Chronic a-fib ([place])   2. Essential hypertension   3. Pure hypercholesterolemia      PLAN:  In order of problems listed above:  1. AFib: He is  almost certainly in permanent atrial fibrillation in view of the severe dilatation of his left atrium and the long-standing nature of the arrhythmia. He is appropriately anticoagulated. CHADSVasc 3 (age 72, HTN). Dyspnea appears to be related to rapid ventricular rates during physical activity. We'll try to strike a balance between the benefits and side effects of the beta blocker. He will take metoprolol 50 mg in the morning and 75 mg in the evening. 2. HTN: Well controlled. His dose of carvedilol had to be decreased due to orthostatic hypotension not long ago. 3. HLP: On statin/zetia,parameters close to target range. Some weight loss would be beneficial.  If his echocardiogram shows normal findings, will reevaluate symptoms after several weeks of switching  to metoprolol. Left ventricular systolic function is abnormal will bring him in for an earlier appointment.  Medication Adjustments/Labs and Tests Ordered: Current medicines are reviewed at length with the patient today.  Concerns regarding medicines are outlined above.  Medication changes, Labs and Tests ordered today are listed in the Patient Instructions below. Patient Instructions  Dr Sallyanne Kuster has recommended making the following medication changes: 1. DECREASE Metoprolol - take 1 tablet (50 mg total) by mouth every morning and take 1.5 tablets (75 mg total) by mouth every evening.  Your physician recommends that you schedule a follow-up appointment in 6 months. You will receive a reminder letter in the mail two months in advance. If you don't receive a letter, please call our office to schedule the follow-up appointment.  If you need a refill on your cardiac medications before your next appointment, please call your pharmacy.    Signed, Sanda Klein, MD  08/16/2016 11:00 AM    [facility] [place], [place], [place]  [address] Phone: [phone]; Fax: [phone]

## 2017-04-03 ENCOUNTER — Ambulatory Visit: Payer: Medicare Other | Admitting: Cardiovascular Disease

## 2017-06-26 ENCOUNTER — Encounter: Payer: Self-pay | Admitting: Neurology

## 2017-07-10 ENCOUNTER — Ambulatory Visit: Payer: Medicare Other | Admitting: Cardiovascular Disease

## 2017-07-10 ENCOUNTER — Encounter: Payer: Self-pay | Admitting: Cardiovascular Disease

## 2017-07-10 VITALS — BP 110/80 | HR 102 | Ht 77.0 in | Wt 247.0 lb

## 2017-07-10 DIAGNOSIS — I482 Chronic atrial fibrillation, unspecified: Secondary | ICD-10-CM

## 2017-07-10 DIAGNOSIS — I10 Essential (primary) hypertension: Secondary | ICD-10-CM

## 2017-07-10 DIAGNOSIS — I714 Abdominal aortic aneurysm, without rupture, unspecified: Secondary | ICD-10-CM

## 2017-07-10 DIAGNOSIS — I2510 Atherosclerotic heart disease of native coronary artery without angina pectoris: Secondary | ICD-10-CM

## 2017-07-10 DIAGNOSIS — E7800 Pure hypercholesterolemia, unspecified: Secondary | ICD-10-CM

## 2017-07-10 MED ORDER — DIGOXIN 125 MCG PO TABS: 0.1250 mg | ORAL_TABLET | Freq: Every day | ORAL | 3 refills | Status: AC

## 2017-07-10 NOTE — Patient Instructions (Signed)
Dr Sallyanne Kuster has recommended making the following medication changes: 1. START Digoxin 0.125 mg - take 1 tablet by mouth daily  Your physician recommends that you schedule a follow-up appointment in 12 months. You will receive a reminder letter in the mail two months in advance. If you don't receive a letter, please call our office to schedule the follow-up appointment.  If you need a refill on your cardiac medications before your next appointment, please call your pharmacy.   Please have an EKG done in Dr Perini's office in 2 weeks when you have your coumadin check. Please obtain a copy of the EKG.

## 2017-07-10 NOTE — Progress Notes (Signed)
Cardiology Consultation Note    Date:  07/10/2017   ID:  EAMES DIBIASIO, DOB Jan 06, 1933, MRN [id number]  PCP:  Crist Infante, MD  Cardiologist:   Sanda Klein, MD  Consultation requested for: Exertional dyspnea, atrial fibrillation  Chief Complaint  Patient presents with  . Dyspnea    History of Present Illness:  Adam Kane is a 83 y.o. male with chronic atrial fibrillation (recurrent after multiple attempts at cardioversion in the past), hypertension and hyperlipidemia, coronary artery disease s/p CABG 2001 LIMA-LAD, SVG-PDA), and small abdominal aortic aneurysm.  He describes some relatively subtle complaints.  He becomes easily fatigued if he has to climb stairs.  He has nocturia, especially towards the second half of the night.  He denies orthopnea PND and does not have leg edema.  He does not have dyspnea with usual activity.  He has not had angina in a very long time.  He has developed some resting tremor in his left hand that he can control voluntarily.  He does not have shuffling gait, falls, or difficulty in initiation of movement.  He has a neurology appointment scheduled with Dr. Carles Collet at the end of the month.  He has mild orthostatic dizziness at times.  In the past, his dose of beta-blocker had to be curtailed due to significant orthostatic hypotension.  His echocardiogram November 2017 showed that left ventricular systolic function is well-preserved, but during the study his heart rate was consistently elevated at around 120 bpm. I recommended increasing the dose of metoprolol. This has led to improvement in dyspnea but  caused sleepiness and fatigue during the day, so we cut back on the daytime dose of metoprolol.   He has frequent problems with nephrolithiasis and has previously undergone lithotripsy. He reports previous pulmonary function test that showed excellent results.  Past Medical History:  Diagnosis Date  . A-fib ([place])   . Abdominal aortic aneurysm  (AAA), 30-34 mm diameter (HCC)   . Arthritis   . CAD (coronary artery disease)   . Cancer [facility])    prostate cancer 2009  . Chronic kidney disease    kidney stone  . Complication of anesthesia    if given anesthesia too quickly experiences nausea and vomiting   . Diverticulosis of colon without hemorrhage   . Dyslipidemia   . Dysrhythmia   . Heart murmur    childhood  . S/P CABG (coronary artery bypass graft) 2001  . Shortness of breath    mild exertion   . Systemic hypertension     Past Surgical History:  Procedure Laterality Date  . APPENDECTOMY    . CARDIAC CATHETERIZATION  02/20/94  . CHOLECYSTECTOMY    . CORONARY ARTERY BYPASS GRAFT  2001   LIMA to LAD,SVG to PDA  . CORONARY ARTERY BYPASS GRAFT     2 vessel 2001  . EYE SURGERY     right eye cataract surgery   . HERNIA REPAIR    . PROSTATECTOMY    . TONSILLECTOMY      Current Medications: Outpatient Medications Prior to Visit  Medication Sig Dispense Refill  . aspirin EC 81 MG tablet Take 81 mg by mouth daily.    Marland Kitchen atorvastatin (LIPITOR) 40 MG tablet Take 40 mg by mouth at bedtime.     . cholecalciferol (VITAMIN D) 1000 UNITS tablet Take 1,000 Units by mouth daily.    Marland Kitchen diltiazem (TIAZAC) 240 MG 24 hr capsule Take 240 mg by mouth every morning.     Marland Kitchen  ezetimibe (ZETIA) 10 MG tablet Take 10 mg by mouth every morning.     . metoprolol (LOPRESSOR) 50 MG tablet Take 1 tablet (50 mg total) by mouth every morning and take 1.5 tablets (75 mg total) by mouth every evening. 225 tablet 3  . omeprazole (PRILOSEC) 20 MG capsule Take 20 mg by mouth daily.     Marland Kitchen warfarin (COUMADIN) 2 MG tablet Take 2 mg by mouth daily at 6 PM. Takes 1 tab daily except takes 1.5 tab on Monday Wednesday and Friday     No facility-administered medications prior to visit.      Allergies:   Dilaudid [hydromorphone hcl] and Morphine and related   Social History   Socioeconomic History  . Marital status: Married    Spouse name: None  . Number of  children: None  . Years of education: None  . Highest education level: None  Social Needs  . Financial resource strain: None  . Food insecurity - worry: None  . Food insecurity - inability: None  . Transportation needs - medical: None  . Transportation needs - non-medical: None  Occupational History  . None  Tobacco Use  . Smoking status: Former Smoker    Types: Cigarettes    Last attempt to quit: 09/01/1973    Years since quitting: 43.8  . Smokeless tobacco: Never Used  Substance and Sexual Activity  . Alcohol use: No  . Drug use: No  . Sexual activity: None  Other Topics Concern  . None  Social History Narrative  . None     Family History:  The patient's family history includes Cancer in his mother.   ROS:   Please see the history of present illness.    ROS All other systems reviewed and are negative.   PHYSICAL EXAM:   VS:  BP 110/80   Pulse (!) 102   Ht 6\' 5"  (1.956 m)   Wt 247 lb (112 kg)   BMI 29.29 kg/m      General: Alert, oriented x3, no distress,  borderline obese  Head: no evidence of trauma, PERRL, EOMI, no exophtalmos or lid lag, no myxedema, no xanthelasma; normal ears, nose and oropharynx Neck: normal jugular venous pulsations and no hepatojugular reflux; brisk carotid pulses without delay and no carotid bruits Chest: clear to auscultation, no signs of consolidation by percussion or palpation, normal fremitus, symmetrical and full respiratory excursions Cardiovascular: normal position and quality of the apical impulse, irregular rhythm, normal first and second heart sounds, no murmurs, rubs or gallops Abdomen: no tenderness or distention, no masses by palpation, no abnormal pulsatility or arterial bruits, normal bowel sounds, no hepatosplenomegaly Extremities: no clubbing, cyanosis or edema; 2+ radial, ulnar and brachial pulses bilaterally; 2+ right femoral, posterior tibial and dorsalis pedis pulses; 2+ left femoral, posterior tibial and dorsalis pedis  pulses; no subclavian or femoral bruits Neurological: grossly nonfocal Psych: Normal mood and affect   Wt Readings from Last 3 Encounters:  07/10/17 247 lb (112 kg)  08/15/16 248 lb 9.6 oz (112.8 kg)  06/06/16 247 lb (112 kg)      Studies/Labs Reviewed:   ECG(ordered and performed today): Atrial fibrillation with a ventricular rate of 102 bpm at rest, otherwise normal  LABS:  From [facility] October 2018  Cholesterol 130, triglycerides 101, HDL 31, LDL 79 Creatinine 1.2, hemoglobin 16, normal electrolytes and liver function tests.  TSH 2.13, hemoglobin A1c 6.2% ASSESSMENT:    1. Chronic a-fib ([place])   2. Essential hypertension  3. Pure hypercholesterolemia   4. Coronary artery disease involving native coronary artery of native heart without angina pectoris   5. Abdominal aortic aneurysm (AAA) without rupture (HCC)      PLAN:  In order of problems listed above:  1. AFib:  permanent atrial fibrillation with numerous previous failed attempts at cardioversion.  Ventricular rate control remains suboptimal and I think this explains his complaints.  He probably has a mild degree of heart failure which explains his exertional dyspnea and nocturia.  I do not think is necessary to repeat an echocardiogram but we need to improve rate control.  Unfortunately he has been poorly tolerant of higher doses of beta-blocker due to fatigue.  His blood pressure is also relatively low and I do not know that he will tolerate more diltiazem.  We will add digoxin reevaluate heart rate in a couple of weeks..  If possible, would be useful to perform an electrocardiogram when he goes back to [place] for his warfarin prothrombin time check in 2 weeks. CHADSVasc 3 (age 32, HTN).  2. HTN: Well controlled.  Unable to tolerate higher doses of beta-blocker due to fatigue and orthostatic hypotension 3. HLP: On statin and Zetia combination.  LDL at target.  HDL remains a little low.  Weight loss  encouraged.  Note that his hemoglobin A1c is also borderline diagnostic for diabetes mellitus, albeit with good control without medications.  Previously he is to have ice cream every night.  He has stopped this but now has a bowl of Frosted Flakes every night which I think he should stop. 4. CAD s/p CABG: Free of angina despite rapid heart rate. 5. AAA: Asymptomatic, not evaluated in several years.  If his echocardiogram shows normal findings, will reevaluate symptoms after several weeks of switching to metoprolol. Left ventricular systolic function is abnormal will bring him in for an earlier appointment.  Medication Adjustments/Labs and Tests Ordered: Current medicines are reviewed at length with the patient today.  Concerns regarding medicines are outlined above.  Medication changes, Labs and Tests ordered today are listed in the Patient Instructions below. Patient Instructions  Dr Sallyanne Kuster has recommended making the following medication changes: 1. START Digoxin 0.125 mg - take 1 tablet by mouth daily  Your physician recommends that you schedule a follow-up appointment in 12 months. You will receive a reminder letter in the mail two months in advance. If you don't receive a letter, please call our office to schedule the follow-up appointment.  If you need a refill on your cardiac medications before your next appointment, please call your pharmacy.   Please have an EKG done in Dr Perini's office in 2 weeks when you have your coumadin check. Please obtain a copy of the EKG.    Signed, Sanda Klein, MD  07/10/2017 2:39 PM    [facility] [place], [place], [place]  [address] Phone: [phone]; Fax: [phone]

## 2017-07-30 NOTE — Progress Notes (Signed)
Adam Kane was seen today in the movement disorders clinic for neurologic consultation at the request of Crist Infante, MD.  The consultation is for the evaluation of tremor and to r/o PD. This patient is accompanied in the office by his daughter who supplements the history.  Specific Symptoms:  Tremor: Yes.  , one year ago per patient  - longer per daughter.  Started on the L and maybe a little on the right.  Mostly at rest.  He is R hand dominant Family hx of similar:  No. Voice: no change Sleep: wakes up at night to use the bathroom; daughter states always been a restless sleeper  Vivid Dreams:  No.  Acting out dreams:  Yes.  , recently fell out of the bed but only one time Wet Pillows: Yes.  , occasionally Postural symptoms:  No. per patient but yes per daughter  Falls?  No. Bradykinesia symptoms: shuffling gait, slow movements and difficulty getting out of a chair (blames on back surgery) Loss of smell:  No. Loss of taste:  No. Urinary Incontinence:  No. Difficulty Swallowing:  No. Handwriting, micrographia: No. Trouble with ADL's:  Yes.   (sits down to put on pants; some trouble tying shoes but mostly because abdominal girth)  Trouble buttoning clothing: No. Depression:  No. Memory changes:  Yes.   (mild; lives with wife and daughter takes care of finances x 5 years; patient drives and still works for [employer] rent a car; patient sets up own pill box - does really well with regimen) Hallucinations:  No.  visual distortions: No., but has floaters N/V:  No. Lightheaded:  No.  Syncope: No. Diplopia:  No. Dyskinesia:  No.  Neuroimaging of the brain has previously been performed.  MRI was done in 2013 and there was evidence of cerebral small vessel disease.    PREVIOUS MEDICATIONS: none to date  ALLERGIES:   Allergies  Allergen Reactions  . Dilaudid [Hydromorphone Hcl] Other (See Comments)    PASSES OUT  . Morphine And Related Other (See Comments)    PASSES OUT      CURRENT MEDICATIONS:  Outpatient Encounter Medications as of 07/31/2017  Medication Sig  . aspirin EC 81 MG tablet Take 81 mg by mouth daily.  Marland Kitchen atorvastatin (LIPITOR) 40 MG tablet Take 40 mg by mouth at bedtime.   . cholecalciferol (VITAMIN D) 1000 UNITS tablet Take 1,000 Units by mouth daily.  . digoxin (LANOXIN) 0.125 MG tablet Take 1 tablet (0.125 mg total) daily by mouth.  . diltiazem (TIAZAC) 240 MG 24 hr capsule Take 240 mg by mouth every morning.   . ezetimibe (ZETIA) 10 MG tablet Take 10 mg by mouth every morning.   . metoprolol (LOPRESSOR) 50 MG tablet Take 1 tablet (50 mg total) by mouth every morning and take 1.5 tablets (75 mg total) by mouth every evening.  Marland Kitchen omeprazole (PRILOSEC) 20 MG capsule Take 20 mg by mouth daily.   . vitamin B-12 (CYANOCOBALAMIN) 1000 MCG tablet Take 1,000 mcg by mouth daily.  Marland Kitchen warfarin (COUMADIN) 2 MG tablet Take 2 mg by mouth daily at 6 PM. Takes 1 tab daily except takes 1.5 tab on Monday Wednesday and Friday   No facility-administered encounter medications on file as of 07/31/2017.     PAST MEDICAL HISTORY:   Past Medical History:  Diagnosis Date  . A-fib ([place])   . Abdominal aortic aneurysm (AAA), 30-34 mm diameter (HCC)   . Arthritis   . CAD (coronary  artery disease)   . Cancer [facility])    prostate cancer 2009  . Chronic kidney disease    kidney stone  . Complication of anesthesia    if given anesthesia too quickly experiences nausea and vomiting   . Diverticulosis of colon without hemorrhage   . Dyslipidemia   . Dysrhythmia   . Heart murmur    childhood  . S/P CABG (coronary artery bypass graft) 2001  . Shortness of breath    mild exertion   . Systemic hypertension     PAST SURGICAL HISTORY:   Past Surgical History:  Procedure Laterality Date  . APPENDECTOMY    . CARDIAC CATHETERIZATION  02/20/94  . CHOLECYSTECTOMY    . CORONARY ARTERY BYPASS GRAFT  2001   LIMA to LAD,SVG to PDA  . CORONARY ARTERY BYPASS GRAFT     2  vessel 2001  . CYSTOSCOPY WITH RETROGRADE PYELOGRAM, URETEROSCOPY AND STENT PLACEMENT Left 05/18/2014   Procedure: CYSTOSCOPY WITH RETROGRADE PYELOGRAM, URETEROSCOPY AND STENT PLACEMENT;  Surgeon: Raynelle Bring, MD;  Location: [facility];  Service: Urology;  Laterality: Left;  . CYSTOSCOPY WITH RETROGRADE PYELOGRAM, URETEROSCOPY AND STENT PLACEMENT Right 08/21/2014   Procedure: CYSTOSCOPY WITH RETROGRADE PYELOGRAM, URETEROSCOPY AND STENT PLACEMENT WITH STONE EXTRACTION basket;  Surgeon: Malka So, MD;  Location: [facility];  Service: Urology;  Laterality: Right;  . EYE SURGERY     right eye cataract surgery   . HERNIA REPAIR    . HOLMIUM LASER APPLICATION Left 9/79/8921   Procedure: HOLMIUM LASER APPLICATION;  Surgeon: Raynelle Bring, MD;  Location: [facility];  Service: Urology;  Laterality: Left;  . LUMBAR LAMINECTOMY/DECOMPRESSION MICRODISCECTOMY Left 03/07/2013   Procedure: LUMBAR FOUR-FIVE EXTRAFORAMINAL LUMBAR LAMINECTOMY/DECOMPRESSION MICRODISCECTOMY 1 LEVEL;  Surgeon: Charlie Pitter, MD;  Location: [facility];  Service: Neurosurgery;  Laterality: Left;  Left lumbar four-five extraforaminal microdiskectomy  . PROSTATECTOMY    . TONSILLECTOMY      SOCIAL HISTORY:   Social History   Socioeconomic History  . Marital status: Married    Spouse name: Not on file  . Number of children: Not on file  . Years of education: Not on file  . Highest education level: Not on file  Social Needs  . Financial resource strain: Not on file  . Food insecurity - worry: Not on file  . Food insecurity - inability: Not on file  . Transportation needs - medical: Not on file  . Transportation needs - non-medical: Not on file  Occupational History  . Not on file  Tobacco Use  . Smoking status: Former Smoker    Types: Cigarettes    Last attempt to quit: 09/01/1973    Years since quitting: 43.9  . Smokeless tobacco: Never Used  Substance and Sexual Activity  . Alcohol use: No  . Drug use: No  . Sexual activity:  Not on file  Other Topics Concern  . Not on file  Social History Narrative  . Not on file    FAMILY HISTORY:   Family Status  Relation Name Status  . Mother  Deceased  . Father  Deceased  . Daughter  Alive  . Son x2 Alive    ROS:  A complete 10 system review of systems was obtained and was unremarkable apart from what is mentioned above.  PHYSICAL EXAMINATION:    VITALS:   Vitals:   07/31/17 1337  BP: 124/82  Pulse: 70  SpO2: 93%  Weight: 248 lb (112.5 kg)  Height: 6' 5" (1.956  m)    GEN:  The patient appears stated age and is in NAD. HEENT:  Normocephalic, atraumatic.  The mucous membranes are moist. The superficial temporal arteries are without ropiness or tenderness. CV:  RRR Lungs:  CTAB Neck/HEME:  There are no carotid bruits bilaterally.  Neurological examination:  Orientation: The patient is alert and oriented x3. Fund of knowledge is appropriate.  Recent and remote memory are intact.  Attention and concentration are normal.    Able to name objects and repeat phrases. Cranial nerves: There is good facial symmetry. Pupils are equal round and reactive to light bilaterally. Fundoscopic exam is attempted but the disc margins are not well visualized bilaterally. Extraocular muscles are intact. The visual fields are full to confrontational testing. The speech is fluent and clear. Soft palate rises symmetrically and there is no tongue deviation. Hearing is intact to conversational tone. Sensation: Sensation is intact to light and pinprick throughout (facial, trunk, extremities). Vibration is intact at the bilateral big toe. There is no extinction with double simultaneous stimulation. There is no sensory dermatomal level identified. Motor: Strength is 5/5 in the bilateral upper and lower extremities.   Shoulder shrug is equal and symmetric.  There is no pronator drift. Deep tendon reflexes: Deep tendon reflexes are 2/4 at the bilateral biceps, triceps, brachioradialis,  patella and achilles. Plantar responses are downgoing bilaterally.  Movement examination: Tone: There is mild increased tone in the LUE.  There is normal tone elsewhere.   Abnormal movements: There is LUE>RUE resting tremor Coordination:  There is mild decremation with RAM's, with hand opening and closing on the L.  All other RAMs were normal.   Gait and Station: The patient has mild difficulty arising out of a deep-seated chair without the use of the hands and takes 2 attempts. The patient's stride length is normal with re-emergent tremor on the L.  The patient has a negative pull test.      Labs:  Patient had lab work dated June 19, 2017.  Sodium was 141, potassium 4.1, chloride 106, CO2 26, BUN 19, creatinine 1.2, glucose 121, AST 21, ALT 22, alkaline phosphatase 105.  White blood cells were 5.4, hemoglobin 16.2, hematocrit 51.7 and platelets 197.  Total cholesterol was 130, HDL 31, LDL 79.  TSH was 2.13.  ASSESSMENT/PLAN:  1.  Mild idiopathic Parkinson's disease.  This is a new dx: 07/31/17  -We discussed the diagnosis as well as pathophysiology of the disease.  We discussed treatment options as well as prognostic indicators.  Patient education was provided.  -We discussed that it used to be thought that levodopa would increase risk of melanoma but now it is believed that Parkinsons itself likely increases risk of melanoma. he is to get regular skin checks.  -Greater than 50% of the 60 minute visit was spent in counseling answering questions and talking about what to expect now as well as in the future.  We talked about medication options as well as potential future surgical options.  We talked about safety in the home.  -We discussed levodopa in detail.  Ultimately patient refused medication right now, which is reasonable at this stage.  I did discuss with him quality of life studies which show improved QOL in patients who took medication early on in disease course vs those who did not.     -We discussed community resources in the area including patient support groups and community exercise programs for PD and pt education was provided to the patient.  He  would need cardiac clearance to attend the exercise groups but I encouraged him to check out the programs and support groups.  He and his daughter met with our PD social worker today.    2.  Follow up is anticipated in the next 6 months, sooner should new neurologic issues arise.    Cc:  Perini, Mark, MD  

## 2017-07-31 ENCOUNTER — Encounter: Payer: Self-pay | Admitting: Neurology

## 2017-07-31 ENCOUNTER — Ambulatory Visit: Payer: Medicare Other | Admitting: Neurology

## 2017-07-31 VITALS — BP 124/82 | HR 70 | Ht 77.0 in | Wt 248.0 lb

## 2017-07-31 DIAGNOSIS — G20 Parkinson's disease: Secondary | ICD-10-CM

## 2017-08-01 DIAGNOSIS — G20 Parkinson's disease: Secondary | ICD-10-CM | POA: Insufficient documentation

## 2017-08-27 ENCOUNTER — Other Ambulatory Visit: Payer: Self-pay | Admitting: Cardiovascular Disease

## 2017-12-28 ENCOUNTER — Other Ambulatory Visit: Payer: Self-pay | Admitting: Internal Medicine

## 2017-12-28 DIAGNOSIS — M545 Low back pain, unspecified: Secondary | ICD-10-CM

## 2017-12-28 DIAGNOSIS — M5136 Other intervertebral disc degeneration, lumbar region: Secondary | ICD-10-CM

## 2017-12-28 DIAGNOSIS — M47896 Other spondylosis, lumbar region: Secondary | ICD-10-CM

## 2017-12-29 ENCOUNTER — Ambulatory Visit (HOSPITAL_COMMUNITY)
Admission: RE | Admit: 2017-12-29 | Discharge: 2017-12-29 | Disposition: A | Discharge status: Home / Self Care | Payer: Medicare Other | Source: Ambulatory Visit | Attending: Internal Medicine | Admitting: Internal Medicine

## 2017-12-29 DIAGNOSIS — M545 Low back pain, unspecified: Secondary | ICD-10-CM

## 2017-12-29 DIAGNOSIS — M48061 Spinal stenosis, lumbar region without neurogenic claudication: Secondary | ICD-10-CM | POA: Insufficient documentation

## 2017-12-29 DIAGNOSIS — M47896 Other spondylosis, lumbar region: Secondary | ICD-10-CM | POA: Insufficient documentation

## 2017-12-29 DIAGNOSIS — M5136 Other intervertebral disc degeneration, lumbar region: Secondary | ICD-10-CM | POA: Insufficient documentation

## 2017-12-29 DIAGNOSIS — M5116 Intervertebral disc disorders with radiculopathy, lumbar region: Secondary | ICD-10-CM | POA: Diagnosis not present

## 2017-12-30 ENCOUNTER — Other Ambulatory Visit: Payer: Medicare Other

## 2018-01-07 ENCOUNTER — Other Ambulatory Visit: Payer: Self-pay | Admitting: Neurosurgery

## 2018-01-07 ENCOUNTER — Telehealth: Payer: Self-pay | Admitting: Cardiovascular Disease

## 2018-01-07 NOTE — Telephone Encounter (Signed)
° °  [facility] Pre-operative Risk Assessment    Request for surgical clearance:  1. What type of surgery is being performed? Lumbar  microdiscectomy  2. When is this surgery scheduled? TBD  3. What type of clearance is required (medical clearance vs. Pharmacy clearance to hold med vs. Both)? Both  4. Are there any medications that need to be held prior to surgery and how long? warfarin (COUMADIN) 2 MG tablet  5. Practice name and name of physician performing surgery? [facility], Dr Trenton Gammon  6. What is your office phone number [phone]   7.   What is your office fax number [phone]  8.   Anesthesia type (None, local, MAC, general) ? general   Adam Kane 01/07/2018, 2:39 PM  _________________________________________________________________   (provider comments below)

## 2018-01-08 NOTE — Telephone Encounter (Signed)
Called and left message for [name] for coumadin instruction and will fax over.

## 2018-01-08 NOTE — Telephone Encounter (Signed)
Pt takes warfarin for afib with CHADS2VASc score of 4 (age x2, HTN, CAD). Ok to hold warfarin for 5 days prior to procedure, scheduled on 5/17. Last dose of Coumadin will be 5/11. Please route to PCP who is managing Coumadin as an FYI.

## 2018-01-08 NOTE — Telephone Encounter (Addendum)
   Primary Cardiologist:Mihai Croitoru, MD  Chart reviewed as part of pre-operative protocol coverage. Because of Adam Kane's past medical history and time since last visit, he/she will require a follow-up visit in order to better assess preoperative cardiovascular risk. Last OV 07/2017 was pertinent for permanent atrial fib with poor rate control and CHF symptoms. Note states plan to add digoxin and re-evaluate heart rate in a couple of weeks. This also states "If his echocardiogram shows normal findings, will reevaluate symptoms after several weeks of switching to metoprolol. Left ventricular systolic function is abnormal will bring him in for an earlier appointment" but I believe the latter is a reference from an earlier note.  Initially I suggested pt come in for re-check OV but patient's daughter raised significant concern about the ability to even get him to the OV given his degree of debilitation and back discomfort which prompted the surgery. She states from cardiac standpoint he is doing well without CP, SOB, or swelling. She states he's been followed closely by Dr. Joylene Draft and that the digoxin has done well for him. She states the neurosurgeon requested he be off coumadin for 7 days which they have already begun holding. She asked I speak with Dr. Sallyanne Kuster about his clearance for surgery.  Callback staff, please call patient's daughter and relay the following: I spoke with Dr. Sallyanne Kuster who agrees given cardiac hx he is at increased risk for cardiac complications but does not feel that there is anything we can do to reduce this risk, therefore he is cleared to proceed. Dr. Sallyanne Kuster recommends to make sure to continue the metoprolol as the surgeon is able to perioperatively, and is OK with the duration of Coumadin holding that was suggested by the neurosurgeon's office. We would recommend they touch base with PCP's office to make sure primary care coumadin clinic helps to facilitate resumption  of Coumadin and INR checks when surgery allows him to return on it.  Will route this bundled recommendation to requesting provider via Epic fax function. Please call with questions.  Charlie Pitter, PA-C  01/08/2018, 3:57 PM

## 2018-01-08 NOTE — Telephone Encounter (Signed)
pts daughter is aware and agreeable to recommendations. I will refax to neurosurgeons office.

## 2018-01-08 NOTE — Telephone Encounter (Signed)
Follow up   Adam Kane at Dr. Marchelle Folks office is calling to check on the clearance. Please call

## 2018-01-12 ENCOUNTER — Other Ambulatory Visit: Payer: Self-pay

## 2018-01-12 ENCOUNTER — Encounter (HOSPITAL_COMMUNITY): Payer: Self-pay

## 2018-01-12 ENCOUNTER — Encounter (HOSPITAL_COMMUNITY)
Admission: RE | Admit: 2018-01-12 | Discharge: 2018-01-12 | Disposition: A | Discharge status: Home / Self Care | Payer: Medicare Other | Source: Ambulatory Visit | Attending: Neurosurgery | Admitting: Neurosurgery

## 2018-01-12 DIAGNOSIS — Z01810 Encounter for preprocedural cardiovascular examination: Secondary | ICD-10-CM | POA: Insufficient documentation

## 2018-01-12 DIAGNOSIS — I714 Abdominal aortic aneurysm, without rupture: Secondary | ICD-10-CM | POA: Diagnosis present

## 2018-01-12 DIAGNOSIS — Z01812 Encounter for preprocedural laboratory examination: Secondary | ICD-10-CM | POA: Diagnosis not present

## 2018-01-12 HISTORY — DX: Nausea with vomiting, unspecified: R11.2

## 2018-01-12 HISTORY — DX: Unspecified urinary incontinence: R32

## 2018-01-12 HISTORY — DX: Personal history of urinary calculi: Z87.442

## 2018-01-12 HISTORY — DX: Gastro-esophageal reflux disease without esophagitis: K21.9

## 2018-01-12 HISTORY — DX: Parkinson's disease without dyskinesia, without mention of fluctuations: G20.A1

## 2018-01-12 HISTORY — DX: Other specified postprocedural states: Z98.890

## 2018-01-12 HISTORY — DX: Parkinson's disease: G20

## 2018-01-12 LAB — BASIC METABOLIC PANEL
ANION GAP: 8 (ref 5–15)
BUN: 16 mg/dL (ref 6–20)
CHLORIDE: 107 mmol/L (ref 101–111)
CO2: 26 mmol/L (ref 22–32)
Calcium: 10 mg/dL (ref 8.9–10.3)
Creatinine, Ser: 1.01 mg/dL (ref 0.61–1.24)
Glucose, Bld: 86 mg/dL (ref 65–99)
POTASSIUM: 3.9 mmol/L (ref 3.5–5.1)
SODIUM: 141 mmol/L (ref 135–145)

## 2018-01-12 LAB — CBC WITH DIFFERENTIAL/PLATELET
Abs Immature Granulocytes: 0 10*3/uL (ref 0.0–0.1)
BASOS ABS: 0 10*3/uL (ref 0.0–0.1)
Basophils Relative: 0 %
Eosinophils Absolute: 0 10*3/uL (ref 0.0–0.7)
Eosinophils Relative: 1 %
HEMATOCRIT: 50.6 % (ref 39.0–52.0)
HEMOGLOBIN: 16.3 g/dL (ref 13.0–17.0)
IMMATURE GRANULOCYTES: 0 %
LYMPHS ABS: 1.3 10*3/uL (ref 0.7–4.0)
LYMPHS PCT: 19 %
MCH: 28.6 pg (ref 26.0–34.0)
MCHC: 32.2 g/dL (ref 30.0–36.0)
MCV: 88.9 fL (ref 78.0–100.0)
Monocytes Absolute: 0.7 10*3/uL (ref 0.1–1.0)
Monocytes Relative: 10 %
NEUTROS ABS: 4.8 10*3/uL (ref 1.7–7.7)
NEUTROS PCT: 70 %
Platelets: 185 10*3/uL (ref 150–400)
RBC: 5.69 MIL/uL (ref 4.22–5.81)
RDW: 15.1 % (ref 11.5–15.5)
WBC: 6.9 10*3/uL (ref 4.0–10.5)

## 2018-01-12 LAB — PROTIME-INR
INR: 2.06
Prothrombin Time: 23.1 seconds — ABNORMAL HIGH (ref 11.4–15.2)

## 2018-01-12 LAB — SURGICAL PCR SCREEN
MRSA, PCR: NEGATIVE
Staphylococcus aureus: NEGATIVE

## 2018-01-12 NOTE — Progress Notes (Signed)
   01/12/18 1546  OBSTRUCTIVE SLEEP APNEA  Have you ever been diagnosed with sleep apnea through a sleep study? No  Do you snore loudly (loud enough to be heard through closed doors)?  1  Do you often feel tired, fatigued, or sleepy during the daytime (such as falling asleep during driving or talking to someone)? 0  Has anyone observed you stop breathing during your sleep? 0  Do you have, or are you being treated for high blood pressure? 1  BMI more than 35 kg/m2? 0  Age > 50 (1-yes) 1  Neck circumference greater than:Male 16 inches or larger, Male 17inches or larger? 1  Male Gender (Yes=1) 1  Obstructive Sleep Apnea Score 5  Score 5 or greater  Results sent to PCP

## 2018-01-12 NOTE — Progress Notes (Signed)
PCP - Dr. Joylene Draft Cardiologist - Dr. Sallyanne Kuster; clearance in [place] 01/08/2018  Chest x-ray - n/a EKG - 07/30/2018 Stress Test - patient unsure; states it was a long time ago ECHO - 07/04/2016 Cardiac Cath - 15+ years ago, after CABG, at [place] - patient denies; + stop bang, forwarded to Dr. Joylene Draft   Blood Thinner Instructions: LD warfarin 01/09/2018 Aspirin Instructions: LD ASA 01/09/2018  Anesthesia review: yes, cardiac history  Patient denies shortness of breath, fever, cough and chest pain at PAT appointment   Patient verbalized understanding of instructions that were given to them at the PAT appointment. Patient was also instructed that they will need to review over the PAT instructions again at home before surgery.

## 2018-01-12 NOTE — Pre-Procedure Instructions (Signed)
MARSHAL ESKEW  01/12/2018      [place], [name] [address] Phone: [phone] Fax: [phone]    Your procedure is scheduled on 01/15/2018.  Report to [facility] Admitting at 0600 A.M.  Call this number if you have problems the morning of surgery:  [phone]   Remember:  Do not eat food or drink liquids after midnight.  Continue all medications as directed by your physician except follow these medication instructions before surgery below   Take these medicines the morning of surgery with A SIP OF WATER: Digoxin (Lanoxin) Diltiazem (Tiazac) Metoprolol tartrate (Lopressor) Omeprazole (Prilosec)  7 days prior to surgery STOP taking any Aspirin(unless otherwise instructed by your surgeon), Aleve, Naproxen, Ibuprofen, Motrin, Advil, Goody's, BC's, all herbal medications, fish oil, and all vitamins  Follow your doctors instructions regarding your Warfarin (Coumadin) and Aspirin.  If no instructions were given by your doctor, then you will need to call the prescribing office office to get instructions.      Do not wear jewelry.  Do not wear lotions, powders, or colognes, or deodorant.  Men may shave face and neck.  Do not bring valuables to the hospital.  [facility] is not responsible for any belongings or valuables.  Hearing aids, eyeglasses, contacts, dentures or bridgework may not be worn into surgery.  Leave your suitcase in the car.  After surgery it may be brought to your room.  For patients admitted to the hospital, discharge time will be determined by your treatment team.  Patients discharged the day of surgery will not be allowed to drive home.   Name and phone number of your driver:    Special instructions:   [place]- Preparing For Surgery  Before surgery, you can play an important role. Because skin is not sterile, your skin needs to be as free of  germs as possible. You can reduce the number of germs on your skin by washing with CHG (chlorahexidine gluconate) Soap before surgery.  CHG is an antiseptic cleaner which kills germs and bonds with the skin to continue killing germs even after washing.  Oral Hygiene is also important to reduce your risk of infection.  Remember - BRUSH YOUR TEETH THE MORNING OF SURGERY  Please do not use if you have an allergy to CHG or antibacterial soaps. If your skin becomes reddened/irritated stop using the CHG.  Do not shave (including legs and underarms) for at least 48 hours prior to first CHG shower. It is OK to shave your face.  Please follow these instructions carefully.   1. Shower the NIGHT BEFORE SURGERY and the MORNING OF SURGERY with CHG.   2. If you chose to wash your hair, wash your hair first as usual with your normal shampoo.  3. After you shampoo, rinse your hair and body thoroughly to remove the shampoo.  4. Use CHG as you would any other liquid soap. You can apply CHG directly to the skin and wash gently with a scrungie or a clean washcloth.   5. Apply the CHG Soap to your body ONLY FROM THE NECK DOWN.  Do not use on open wounds or open sores. Avoid contact with your eyes, ears, mouth and genitals (private parts). Wash Face and genitals (private parts)  with your normal soap.  6. Wash thoroughly, paying special attention to the area where your surgery will be performed.  7. Thoroughly  rinse your body with warm water from the neck down.  8. DO NOT shower/wash with your normal soap after using and rinsing off the CHG Soap.  9. Pat yourself dry with a CLEAN TOWEL.  10. Wear CLEAN PAJAMAS to bed the night before surgery, wear comfortable clothes the morning of surgery  11. Place CLEAN SHEETS on your bed the night of your first shower and DO NOT SLEEP WITH PETS.    Day of Surgery: Shower as stated above. Do not apply any deodorants/lotions.  Please wear clean clothes to the  hospital/surgery center.   Remember to brush your teeth.      Please read over the following fact sheets that you were given.

## 2018-01-13 ENCOUNTER — Other Ambulatory Visit (HOSPITAL_COMMUNITY): Payer: Self-pay | Admitting: Internal Medicine

## 2018-01-13 ENCOUNTER — Ambulatory Visit: Payer: Medicare Other | Admitting: Adult Health

## 2018-01-13 DIAGNOSIS — I714 Abdominal aortic aneurysm, without rupture, unspecified: Secondary | ICD-10-CM

## 2018-01-13 NOTE — Progress Notes (Addendum)
Anesthesia Chart Review:   Case:  [id number] Date/Time:  01/15/18 0745   Procedure:  Microdiscectomy - left - L4-L5 extraforaminal (Left Back)   Anesthesia type:  General   Pre-op diagnosis:  HNP   Location:  [facility] OR ROOM 21 / [place] [facility]   Surgeon:  Earnie Larsson, MD     Addendum:   Pt had US aorta 01/14/18 that showed:  1. Slight increase in size of distal abdominal aortic aneurysm now measuring 3.9 x 3.6 cm. Recommend followup by ultrasound in 2 years   DISCUSSION:  - Pt is an 84 year old male with hx CAD (s/p CABG), chronic afib, AAA (3.4 by 2015 CT)  - Last dose ASA and coumadin 01/09/18  - Has cardiac clearance at increased risk with no options for reducing risk  - Reviewed case with Dr. Therisa Doyne. Pt will need to have AAA imaged prior to surgery. I notified [name] in Dr. Marchelle Folks office.     VS: BP (!) 104/59   Pulse 80   Temp 36.7 C   Resp 20   Ht 6\' 3"  (1.905 m)   Wt 223 lb 12.8 oz (101.5 kg)   SpO2 95%   BMI 27.97 kg/m   PROVIDERS: PCP is Crist Infante, MD  Cardiologist is Sanda Klein, MD. Last office visit 07/10/17. Pt cleared for surgery "at increased risk for cardiac complications but" "there is anything we can do to reduce this risk, therefore he is cleared to proceed" by Melina Copa, PA/Dr. Sallyanne Kuster 01/08/18     LABS:  - PT 23.1. Will recheck day of surgery  (all labs ordered are listed, but only abnormal results are displayed)  Labs Reviewed  PROTIME-INR - Abnormal; Notable for the following components:      Result Value   Prothrombin Time 23.1 (*)    All other components within normal limits  SURGICAL PCR SCREEN  BASIC METABOLIC PANEL  CBC WITH DIFFERENTIAL/PLATELET    IMAGING:   CT renal stone study 08/18/14:  - 3 mm distal right ureteral calculus at the UVJ. Mild right hydroureteronephrosis. - 3.4 x 3.4 cm infrarenal abdominal aortic aneurysm   EKG 07/30/17: Atrial fibrillation (87 bpm).  Horizontal axis.  Slight left precordial repolarization  disturbance, consider ischemia, LV overload or a specific change   CV:  Echo 07/04/16:  - Left ventricle: The cavity size was normal. There was mild concentric hypertrophy. Systolic function was normal. The estimated ejection fraction was in the range of 55% to 60%. Wall motion was normal; there were no regional wall motion abnormalities. - Ventricular septum: Septal motion showed paradox. - Left atrium: The atrium was severely dilated. - Impressions: Heterogenous liver parenchyma, possibly multiple cysts.(Note rapid ventricular rates (100-122) during the study.)   Past Medical History:  Diagnosis Date  . A-fib ([place])   . Abdominal aortic aneurysm (AAA), 30-34 mm diameter (HCC)   . Arthritis   . CAD (coronary artery disease)   . Cancer [facility])    prostate cancer 2009  . Chronic kidney disease    kidney stone  . Complication of anesthesia    if given anesthesia too quickly experiences nausea and vomiting   . Diverticulosis of colon without hemorrhage   . Dyslipidemia   . Dysrhythmia   . Early-onset Parkinson's disease ([place])   . GERD (gastroesophageal reflux disease)   . Heart murmur    childhood  . History of kidney stones   . PONV (postoperative nausea and vomiting)   . S/P CABG (coronary  artery bypass graft) 2001  . Shortness of breath    mild exertion due to afib  . Systemic hypertension   . Urinary leakage     Past Surgical History:  Procedure Laterality Date  . APPENDECTOMY    . CARDIAC CATHETERIZATION  02/20/94  . CATARACT EXTRACTION, BILATERAL    . CHOLECYSTECTOMY    . CORONARY ARTERY BYPASS GRAFT  2001   LIMA to LAD,SVG to PDA  . CORONARY ARTERY BYPASS GRAFT     2 vessel 2001  . CYSTOSCOPY WITH RETROGRADE PYELOGRAM, URETEROSCOPY AND STENT PLACEMENT Left 05/18/2014   Procedure: CYSTOSCOPY WITH RETROGRADE PYELOGRAM, URETEROSCOPY AND STENT PLACEMENT;  Surgeon: Raynelle Bring, MD;  Location: [facility];  Service: Urology;  Laterality: Left;  . CYSTOSCOPY WITH RETROGRADE  PYELOGRAM, URETEROSCOPY AND STENT PLACEMENT Right 08/21/2014   Procedure: CYSTOSCOPY WITH RETROGRADE PYELOGRAM, URETEROSCOPY AND STENT PLACEMENT WITH STONE EXTRACTION basket;  Surgeon: Malka So, MD;  Location: [facility];  Service: Urology;  Laterality: Right;  . HERNIA REPAIR    . HOLMIUM LASER APPLICATION Left 03/03/5008   Procedure: HOLMIUM LASER APPLICATION;  Surgeon: Raynelle Bring, MD;  Location: [facility]S;  Service: Urology;  Laterality: Left;  . LUMBAR LAMINECTOMY/DECOMPRESSION MICRODISCECTOMY Left 03/07/2013   Procedure: LUMBAR FOUR-FIVE EXTRAFORAMINAL LUMBAR LAMINECTOMY/DECOMPRESSION MICRODISCECTOMY 1 LEVEL;  Surgeon: Charlie Pitter, MD;  Location: [place] [facility]UR[facility] ORS;  Service: Neurosurgery;  Laterality: Left;  Left lumbar four-five extraforaminal microdiskectomy  . PROSTATECTOMY    . TONSILLECTOMY      MEDICATIONS: . aspirin EC 81 MG tablet  . atorvastatin (LIPITOR) 40 MG tablet  . cholecalciferol (VITAMIN D) 1000 UNITS tablet  . digoxin (LANOXIN) 0.125 MG tablet  . diltiazem (TIAZAC) 240 MG 24 hr capsule  . ezetimibe (ZETIA) 10 MG tablet  . metoprolol tartrate (LOPRESSOR) 25 MG tablet  . omeprazole (PRILOSEC) 20 MG capsule  . vitamin B-12 (CYANOCOBALAMIN) 1000 MCG tablet  . warfarin (COUMADIN) 2 MG tablet   No current facility-administered medications for this encounter.    - Last dose coumadin and ASA 01/09/18.    If no changes, I anticipate pt can proceed with surgery as scheduled.   Willeen Cass, FNP-BC [facility] Stay [facility]/Anesthesiology Phone: [phone] 01/14/2018 4:09 PM

## 2018-01-14 ENCOUNTER — Ambulatory Visit (HOSPITAL_COMMUNITY)
Admission: RE | Admit: 2018-01-14 | Discharge: 2018-01-14 | Disposition: A | Discharge status: Home / Self Care | Payer: Medicare Other | Source: Ambulatory Visit | Attending: Internal Medicine | Admitting: Internal Medicine

## 2018-01-14 DIAGNOSIS — I714 Abdominal aortic aneurysm, without rupture, unspecified: Secondary | ICD-10-CM

## 2018-01-15 ENCOUNTER — Ambulatory Visit (HOSPITAL_COMMUNITY)
Admission: RE | Disposition: A | Discharge status: Home / Self Care | Payer: Self-pay | Source: Ambulatory Visit | Attending: Neurosurgery

## 2018-01-15 ENCOUNTER — Encounter (HOSPITAL_COMMUNITY): Payer: Self-pay | Admitting: *Deleted

## 2018-01-15 ENCOUNTER — Other Ambulatory Visit: Payer: Self-pay

## 2018-01-15 ENCOUNTER — Inpatient Hospital Stay (HOSPITAL_COMMUNITY): Payer: Medicare Other | Admitting: Emergency Medicine

## 2018-01-15 ENCOUNTER — Inpatient Hospital Stay (HOSPITAL_COMMUNITY): Payer: Medicare Other

## 2018-01-15 ENCOUNTER — Observation Stay (HOSPITAL_COMMUNITY)
Admission: RE | Admit: 2018-01-15 | Discharge: 2018-01-16 | Disposition: A | Discharge status: Home / Self Care | Payer: Medicare Other | Source: Ambulatory Visit | Attending: Neurosurgery | Admitting: Neurosurgery

## 2018-01-15 DIAGNOSIS — M5126 Other intervertebral disc displacement, lumbar region: Secondary | ICD-10-CM | POA: Diagnosis present

## 2018-01-15 DIAGNOSIS — M1990 Unspecified osteoarthritis, unspecified site: Secondary | ICD-10-CM | POA: Insufficient documentation

## 2018-01-15 DIAGNOSIS — I4891 Unspecified atrial fibrillation: Secondary | ICD-10-CM | POA: Diagnosis not present

## 2018-01-15 DIAGNOSIS — Z803 Family history of malignant neoplasm of breast: Secondary | ICD-10-CM | POA: Diagnosis not present

## 2018-01-15 DIAGNOSIS — I2510 Atherosclerotic heart disease of native coronary artery without angina pectoris: Secondary | ICD-10-CM | POA: Insufficient documentation

## 2018-01-15 DIAGNOSIS — Z9842 Cataract extraction status, left eye: Secondary | ICD-10-CM | POA: Insufficient documentation

## 2018-01-15 DIAGNOSIS — I714 Abdominal aortic aneurysm, without rupture: Secondary | ICD-10-CM | POA: Insufficient documentation

## 2018-01-15 DIAGNOSIS — Z8249 Family history of ischemic heart disease and other diseases of the circulatory system: Secondary | ICD-10-CM | POA: Insufficient documentation

## 2018-01-15 DIAGNOSIS — Z808 Family history of malignant neoplasm of other organs or systems: Secondary | ICD-10-CM | POA: Insufficient documentation

## 2018-01-15 DIAGNOSIS — Z800 Family history of malignant neoplasm of digestive organs: Secondary | ICD-10-CM | POA: Insufficient documentation

## 2018-01-15 DIAGNOSIS — Z8546 Personal history of malignant neoplasm of prostate: Secondary | ICD-10-CM | POA: Diagnosis not present

## 2018-01-15 DIAGNOSIS — Z79899 Other long term (current) drug therapy: Secondary | ICD-10-CM | POA: Insufficient documentation

## 2018-01-15 DIAGNOSIS — Z419 Encounter for procedure for purposes other than remedying health state, unspecified: Secondary | ICD-10-CM

## 2018-01-15 DIAGNOSIS — Z87442 Personal history of urinary calculi: Secondary | ICD-10-CM | POA: Diagnosis not present

## 2018-01-15 DIAGNOSIS — K5730 Diverticulosis of large intestine without perforation or abscess without bleeding: Secondary | ICD-10-CM | POA: Diagnosis not present

## 2018-01-15 DIAGNOSIS — M5116 Intervertebral disc disorders with radiculopathy, lumbar region: Principal | ICD-10-CM | POA: Insufficient documentation

## 2018-01-15 DIAGNOSIS — E785 Hyperlipidemia, unspecified: Secondary | ICD-10-CM | POA: Insufficient documentation

## 2018-01-15 DIAGNOSIS — I739 Peripheral vascular disease, unspecified: Secondary | ICD-10-CM | POA: Insufficient documentation

## 2018-01-15 DIAGNOSIS — K219 Gastro-esophageal reflux disease without esophagitis: Secondary | ICD-10-CM | POA: Insufficient documentation

## 2018-01-15 DIAGNOSIS — Z951 Presence of aortocoronary bypass graft: Secondary | ICD-10-CM | POA: Insufficient documentation

## 2018-01-15 DIAGNOSIS — Z885 Allergy status to narcotic agent status: Secondary | ICD-10-CM | POA: Diagnosis not present

## 2018-01-15 DIAGNOSIS — G20 Parkinson's disease: Secondary | ICD-10-CM | POA: Diagnosis not present

## 2018-01-15 DIAGNOSIS — Z9841 Cataract extraction status, right eye: Secondary | ICD-10-CM | POA: Insufficient documentation

## 2018-01-15 DIAGNOSIS — I10 Essential (primary) hypertension: Secondary | ICD-10-CM | POA: Diagnosis not present

## 2018-01-15 DIAGNOSIS — Z87891 Personal history of nicotine dependence: Secondary | ICD-10-CM | POA: Insufficient documentation

## 2018-01-15 DIAGNOSIS — Z888 Allergy status to other drugs, medicaments and biological substances status: Secondary | ICD-10-CM | POA: Diagnosis not present

## 2018-01-15 DIAGNOSIS — Z7901 Long term (current) use of anticoagulants: Secondary | ICD-10-CM | POA: Insufficient documentation

## 2018-01-15 HISTORY — PX: LUMBAR LAMINECTOMY/DECOMPRESSION MICRODISCECTOMY: SHX5026

## 2018-01-15 LAB — PROTIME-INR
INR: 1.31
Prothrombin Time: 16.2 seconds — ABNORMAL HIGH (ref 11.4–15.2)

## 2018-01-15 SURGERY — LUMBAR LAMINECTOMY/DECOMPRESSION MICRODISCECTOMY 1 LEVEL
Anesthesia: General | Site: Back | Laterality: Left

## 2018-01-15 MED ORDER — CEFAZOLIN SODIUM-DEXTROSE 2-4 GM/100ML-% IV SOLN
2.0000 g | INTRAVENOUS | Status: AC
  Administered 2018-01-15: 2 g via INTRAVENOUS
  Filled 2018-01-15: qty 100

## 2018-01-15 MED ORDER — DIGOXIN 125 MCG PO TABS
0.1250 mg | ORAL_TABLET | Freq: Every day | ORAL | Status: DC
  Administered 2018-01-15: 0.125 mg via ORAL
  Filled 2018-01-15 (×2): qty 1

## 2018-01-15 MED ORDER — METOPROLOL TARTRATE 25 MG PO TABS
25.0000 mg | ORAL_TABLET | Freq: Two times a day (BID) | ORAL | Status: DC
  Administered 2018-01-15: 25 mg via ORAL
  Filled 2018-01-15 (×2): qty 1

## 2018-01-15 MED ORDER — MENTHOL 3 MG MT LOZG
1.0000 | LOZENGE | OROMUCOSAL | Status: DC | PRN
  Filled 2018-01-15: qty 9

## 2018-01-15 MED ORDER — SODIUM CHLORIDE 0.9% FLUSH
3.0000 mL | Freq: Two times a day (BID) | INTRAVENOUS | Status: DC
  Administered 2018-01-15: 3 mL via INTRAVENOUS

## 2018-01-15 MED ORDER — BACITRACIN 50000 UNITS IM SOLR
INTRAMUSCULAR | Status: DC | PRN
  Administered 2018-01-15: 09:00:00

## 2018-01-15 MED ORDER — ROCURONIUM BROMIDE 100 MG/10ML IV SOLN
INTRAVENOUS | Status: DC | PRN
  Administered 2018-01-15: 50 mg via INTRAVENOUS

## 2018-01-15 MED ORDER — PHENYLEPHRINE 40 MCG/ML (10ML) SYRINGE FOR IV PUSH (FOR BLOOD PRESSURE SUPPORT)
PREFILLED_SYRINGE | INTRAVENOUS | Status: AC
Start: 2018-01-15 — End: ?
  Filled 2018-01-15: qty 10

## 2018-01-15 MED ORDER — LACTATED RINGERS IV SOLN
Freq: Once | INTRAVENOUS | Status: AC
  Administered 2018-01-15: 07:00:00 via INTRAVENOUS

## 2018-01-15 MED ORDER — PROPOFOL 10 MG/ML IV BOLUS
INTRAVENOUS | Status: AC
Start: 2018-01-15 — End: ?
  Filled 2018-01-15: qty 40

## 2018-01-15 MED ORDER — 0.9 % SODIUM CHLORIDE (POUR BTL) OPTIME
TOPICAL | Status: DC | PRN
  Administered 2018-01-15: 1000 mL

## 2018-01-15 MED ORDER — ALBUMIN HUMAN 5 % IV SOLN
INTRAVENOUS | Status: DC | PRN
  Administered 2018-01-15: 09:00:00 via INTRAVENOUS

## 2018-01-15 MED ORDER — THROMBIN 5000 UNITS EX SOLR
CUTANEOUS | Status: AC
  Filled 2018-01-15: qty 5000

## 2018-01-15 MED ORDER — PROPOFOL 10 MG/ML IV BOLUS
INTRAVENOUS | Status: AC
  Filled 2018-01-15: qty 20

## 2018-01-15 MED ORDER — CYCLOBENZAPRINE HCL 10 MG PO TABS
10.0000 mg | ORAL_TABLET | Freq: Three times a day (TID) | ORAL | Status: DC | PRN
  Filled 2018-01-15: qty 1

## 2018-01-15 MED ORDER — EZETIMIBE 10 MG PO TABS
10.0000 mg | ORAL_TABLET | Freq: Every morning | ORAL | Status: DC
  Filled 2018-01-15 (×2): qty 1

## 2018-01-15 MED ORDER — KETOROLAC TROMETHAMINE 15 MG/ML IJ SOLN
30.0000 mg | Freq: Four times a day (QID) | INTRAMUSCULAR | Status: DC
  Administered 2018-01-15 – 2018-01-16 (×3): 30 mg via INTRAVENOUS
  Filled 2018-01-15 (×4): qty 2

## 2018-01-15 MED ORDER — ACETAMINOPHEN 650 MG RE SUPP: 650.0000 mg | RECTAL | Status: DC | PRN

## 2018-01-15 MED ORDER — DILTIAZEM HCL ER COATED BEADS 240 MG PO CP24
240.0000 mg | ORAL_CAPSULE | Freq: Every morning | ORAL | Status: DC
  Filled 2018-01-15 (×2): qty 1

## 2018-01-15 MED ORDER — VITAMIN D 1000 UNITS PO TABS
1000.0000 [IU] | ORAL_TABLET | Freq: Every day | ORAL | Status: DC
  Administered 2018-01-15: 1000 [IU] via ORAL
  Filled 2018-01-15 (×2): qty 1

## 2018-01-15 MED ORDER — PANTOPRAZOLE SODIUM 40 MG PO TBEC: 40.0000 mg | DELAYED_RELEASE_TABLET | Freq: Every day | ORAL | Status: DC

## 2018-01-15 MED ORDER — EPHEDRINE SULFATE 50 MG/ML IJ SOLN
INTRAMUSCULAR | Status: DC | PRN
  Administered 2018-01-15: 15 mg via INTRAVENOUS
  Administered 2018-01-15: 10 mg via INTRAVENOUS

## 2018-01-15 MED ORDER — ATORVASTATIN CALCIUM 20 MG PO TABS: 40.0000 mg | ORAL_TABLET | Freq: Every day | ORAL | Status: DC

## 2018-01-15 MED ORDER — GELATIN ABSORBABLE MT POWD
OROMUCOSAL | Status: DC | PRN
  Administered 2018-01-15: 09:00:00 via TOPICAL

## 2018-01-15 MED ORDER — KETOROLAC TROMETHAMINE 30 MG/ML IJ SOLN
INTRAMUSCULAR | Status: DC | PRN
  Administered 2018-01-15: 30 mg via INTRAVENOUS

## 2018-01-15 MED ORDER — FENTANYL CITRATE (PF) 100 MCG/2ML IJ SOLN
INTRAMUSCULAR | Status: DC | PRN
  Administered 2018-01-15: 100 ug via INTRAVENOUS
  Administered 2018-01-15 (×2): 50 ug via INTRAVENOUS

## 2018-01-15 MED ORDER — HYDROCODONE-ACETAMINOPHEN 10-325 MG PO TABS: 2.0000 | ORAL_TABLET | ORAL | Status: DC | PRN

## 2018-01-15 MED ORDER — DEXAMETHASONE SODIUM PHOSPHATE 10 MG/ML IJ SOLN
10.0000 mg | INTRAMUSCULAR | Status: AC
  Administered 2018-01-15: 10 mg via INTRAVENOUS
  Filled 2018-01-15: qty 1

## 2018-01-15 MED ORDER — CHLORHEXIDINE GLUCONATE CLOTH 2 % EX PADS: 6.0000 | MEDICATED_PAD | Freq: Once | CUTANEOUS | Status: DC

## 2018-01-15 MED ORDER — PROPOFOL 500 MG/50ML IV EMUL
INTRAVENOUS | Status: DC | PRN
  Administered 2018-01-15: 25 ug/kg/min via INTRAVENOUS

## 2018-01-15 MED ORDER — PROPOFOL 10 MG/ML IV BOLUS
INTRAVENOUS | Status: DC | PRN
  Administered 2018-01-15: 20 mg via INTRAVENOUS
  Administered 2018-01-15 (×2): 60 mg via INTRAVENOUS

## 2018-01-15 MED ORDER — FENTANYL CITRATE (PF) 250 MCG/5ML IJ SOLN
INTRAMUSCULAR | Status: AC
  Filled 2018-01-15: qty 5

## 2018-01-15 MED ORDER — ACETAMINOPHEN 325 MG PO TABS: 650.0000 mg | ORAL_TABLET | ORAL | Status: DC | PRN

## 2018-01-15 MED ORDER — VITAMIN B-12 1000 MCG PO TABS
1000.0000 ug | ORAL_TABLET | Freq: Every day | ORAL | Status: DC
  Administered 2018-01-15: 1000 ug via ORAL
  Filled 2018-01-15 (×2): qty 1

## 2018-01-15 MED ORDER — LIDOCAINE HCL (CARDIAC) PF 100 MG/5ML IV SOSY
PREFILLED_SYRINGE | INTRAVENOUS | Status: DC | PRN
  Administered 2018-01-15: 60 mg via INTRAVENOUS

## 2018-01-15 MED ORDER — HYDROCODONE-ACETAMINOPHEN 5-325 MG PO TABS
1.0000 | ORAL_TABLET | ORAL | Status: DC | PRN
  Administered 2018-01-15: 1 via ORAL
  Filled 2018-01-15 (×2): qty 1

## 2018-01-15 MED ORDER — BUPIVACAINE HCL (PF) 0.25 % IJ SOLN
INTRAMUSCULAR | Status: AC
  Filled 2018-01-15: qty 30

## 2018-01-15 MED ORDER — SUGAMMADEX SODIUM 500 MG/5ML IV SOLN
INTRAVENOUS | Status: DC | PRN
  Administered 2018-01-15: 210 mg via INTRAVENOUS

## 2018-01-15 MED ORDER — HYDROMORPHONE HCL 1 MG/ML IJ SOLN: 1.0000 mg | INTRAMUSCULAR | Status: DC | PRN

## 2018-01-15 MED ORDER — CEFAZOLIN SODIUM-DEXTROSE 1-4 GM/50ML-% IV SOLN
1.0000 g | Freq: Three times a day (TID) | INTRAVENOUS | Status: AC
  Administered 2018-01-15 (×2): 1 g via INTRAVENOUS
  Filled 2018-01-15 (×2): qty 50

## 2018-01-15 MED ORDER — BUPIVACAINE HCL (PF) 0.25 % IJ SOLN
INTRAMUSCULAR | Status: DC | PRN
  Administered 2018-01-15: 20 mL

## 2018-01-15 MED ORDER — ONDANSETRON HCL 4 MG/2ML IJ SOLN: 4.0000 mg | Freq: Four times a day (QID) | INTRAMUSCULAR | Status: DC | PRN

## 2018-01-15 MED ORDER — LACTATED RINGERS IV SOLN
INTRAVENOUS | Status: DC | PRN
  Administered 2018-01-15 (×2): via INTRAVENOUS

## 2018-01-15 MED ORDER — SODIUM CHLORIDE 0.9% FLUSH: 3.0000 mL | INTRAVENOUS | Status: DC | PRN

## 2018-01-15 MED ORDER — PHENOL 1.4 % MT LIQD
1.0000 | OROMUCOSAL | Status: DC | PRN
  Filled 2018-01-15: qty 177

## 2018-01-15 MED ORDER — PHENYLEPHRINE HCL 10 MG/ML IJ SOLN
INTRAMUSCULAR | Status: DC | PRN
  Administered 2018-01-15: 120 ug via INTRAVENOUS
  Administered 2018-01-15: 200 ug via INTRAVENOUS
  Administered 2018-01-15: 160 ug via INTRAVENOUS

## 2018-01-15 MED ORDER — ONDANSETRON HCL 4 MG PO TABS: 4.0000 mg | ORAL_TABLET | Freq: Four times a day (QID) | ORAL | Status: DC | PRN

## 2018-01-15 MED ORDER — PHENYLEPHRINE HCL 10 MG/ML IJ SOLN
INTRAVENOUS | Status: DC | PRN
  Administered 2018-01-15: 50 ug/min via INTRAVENOUS

## 2018-01-15 SURGICAL SUPPLY — 47 items
BAG DECANTER FOR FLEXI CONT (MISCELLANEOUS) ×3 IMPLANT
BENZOIN TINCTURE PRP APPL 2/3 (GAUZE/BANDAGES/DRESSINGS) ×3 IMPLANT
BLADE CLIPPER SURG (BLADE) IMPLANT
BUR CUTTER 7.0 ROUND (BURR) ×3 IMPLANT
CANISTER SUCT 3000ML PPV (MISCELLANEOUS) ×3 IMPLANT
CARTRIDGE OIL MAESTRO DRILL (MISCELLANEOUS) ×1 IMPLANT
CLOSURE WOUND 1/2 X4 (GAUZE/BANDAGES/DRESSINGS) ×1
DECANTER SPIKE VIAL GLASS SM (MISCELLANEOUS) ×3 IMPLANT
DERMABOND ADVANCED (GAUZE/BANDAGES/DRESSINGS) ×2
DERMABOND ADVANCED .7 DNX12 (GAUZE/BANDAGES/DRESSINGS) ×1 IMPLANT
DIFFUSER DRILL AIR PNEUMATIC (MISCELLANEOUS) ×3 IMPLANT
DRAPE HALF SHEET 40X57 (DRAPES) IMPLANT
DRAPE LAPAROTOMY 100X72X124 (DRAPES) ×3 IMPLANT
DRAPE MICROSCOPE LEICA (MISCELLANEOUS) ×3 IMPLANT
DRAPE SURG 17X23 STRL (DRAPES) ×6 IMPLANT
DRSG OPSITE POSTOP 3X4 (GAUZE/BANDAGES/DRESSINGS) ×3 IMPLANT
DURAPREP 26ML APPLICATOR (WOUND CARE) ×3 IMPLANT
ELECT REM PT RETURN 9FT ADLT (ELECTROSURGICAL) ×3
ELECTRODE REM PT RTRN 9FT ADLT (ELECTROSURGICAL) ×1 IMPLANT
GAUZE SPONGE 4X4 12PLY STRL (GAUZE/BANDAGES/DRESSINGS) ×3 IMPLANT
GAUZE SPONGE 4X4 16PLY XRAY LF (GAUZE/BANDAGES/DRESSINGS) IMPLANT
GLOVE ECLIPSE 9.0 STRL (GLOVE) ×3 IMPLANT
GLOVE EXAM NITRILE LRG STRL (GLOVE) IMPLANT
GLOVE EXAM NITRILE XL STR (GLOVE) IMPLANT
GLOVE EXAM NITRILE XS STR PU (GLOVE) IMPLANT
GOWN STRL REUS W/ TWL LRG LVL3 (GOWN DISPOSABLE) IMPLANT
GOWN STRL REUS W/ TWL XL LVL3 (GOWN DISPOSABLE) ×1 IMPLANT
GOWN STRL REUS W/TWL 2XL LVL3 (GOWN DISPOSABLE) IMPLANT
GOWN STRL REUS W/TWL LRG LVL3 (GOWN DISPOSABLE)
GOWN STRL REUS W/TWL XL LVL3 (GOWN DISPOSABLE) ×2
HEMOSTAT POWDER KIT SURGIFOAM (HEMOSTASIS) ×3 IMPLANT
KIT BASIN OR (CUSTOM PROCEDURE TRAY) ×3 IMPLANT
KIT TURNOVER KIT B (KITS) ×3 IMPLANT
NEEDLE HYPO 22GX1.5 SAFETY (NEEDLE) ×3 IMPLANT
NEEDLE SPNL 22GX3.5 QUINCKE BK (NEEDLE) ×3 IMPLANT
NS IRRIG 1000ML POUR BTL (IV SOLUTION) ×3 IMPLANT
OIL CARTRIDGE MAESTRO DRILL (MISCELLANEOUS) ×3
PACK LAMINECTOMY NEURO (CUSTOM PROCEDURE TRAY) ×3 IMPLANT
PAD ARMBOARD 7.5X6 YLW CONV (MISCELLANEOUS) ×9 IMPLANT
RUBBERBAND STERILE (MISCELLANEOUS) ×6 IMPLANT
SPONGE SURGIFOAM ABS GEL SZ50 (HEMOSTASIS) IMPLANT
STRIP CLOSURE SKIN 1/2X4 (GAUZE/BANDAGES/DRESSINGS) ×2 IMPLANT
SUT VIC AB 2-0 CT1 18 (SUTURE) ×3 IMPLANT
SUT VIC AB 3-0 SH 8-18 (SUTURE) ×3 IMPLANT
TOWEL GREEN STERILE (TOWEL DISPOSABLE) ×3 IMPLANT
TOWEL GREEN STERILE FF (TOWEL DISPOSABLE) ×3 IMPLANT
WATER STERILE IRR 1000ML POUR (IV SOLUTION) ×3 IMPLANT

## 2018-01-15 NOTE — Progress Notes (Signed)
Occupational Therapy Evaluation Patient Details Name: Adam Kane MRN: [id number] DOB: 1933-08-20 Today's Date: 01/15/2018    History of Present Illness Jie Stickels is an 84y.o. male with PMH left-sided L4-5 extraforaminal microdiscectomy.  He presented to [facility] 01/15/2018 with severe recurrent left-sided back pain with radiation to his left lower extremity.  Symptoms greatly increased with attempts to stand and walk.  Patient has not responded to conservative management.  He is miserable with intractable pain.  He is s/p a redo extraforaminal microdiscectomy for treatment of a significant recurrent disc herniation extraforaminal 8 L4-5 on the left.   Clinical Impression   Pt is s/p left-sided L4-5 extraforaminal microdisectomy.resulting in functional limitations due to the deficits listed below (see OT list). Pt is modified independent with the use of AE for LB dressing and LB bathing. Pt is independent in UB dressing/bathing.  Pt demonstrated understanding of back precautions during LB dressing and bed mobility. Pt and daughter expressed concern with balance due to early-onset Parkinson's; discussed additional resources concerning Parkinson's.  Pt is functioning at prior level; No OT follow-up is recommended at this time.     Follow Up Recommendations  No OT follow up    Equipment Recommendations  Other (comment)(reacher, long handled shoe horn, sock aide)    Recommendations for Other Services       Precautions / Restrictions Precautions Precautions: Back;Other (comment)(Early-onset Parkinson's ) Restrictions Weight Bearing Restrictions: No Other Position/Activity Restrictions: back precautions      Mobility Bed Mobility Overal bed mobility: Independent             General bed mobility comments: pt educated on appropriate sit<>supine adhereing to back precautions.   Transfers Overall transfer level: Modified independent               General transfer comment:       Balance Overall balance assessment: Mild deficits observed, not formally tested(pt reports falling at work 2wks ago secondary to syncope. )                                         ADL either performed or assessed with clinical judgement   ADL Overall ADL's : Modified independent                                       General ADL Comments: Following instruction, pt used reacher, long handled shoe horn, sock aide to don/doff socks, don shoes, don pants. (pt and daughter plan to purchase 'hip kit' from gift shop. )     Vision Baseline Vision/History: Wears glasses Wears Glasses: Reading only       Perception     Praxis      Pertinent Vitals/Pain Pain Assessment: No/denies pain     Hand Dominance Right   Extremity/Trunk Assessment Upper Extremity Assessment Upper Extremity Assessment: Overall WFL for tasks assessed   Lower Extremity Assessment Lower Extremity Assessment: Overall WFL for tasks assessed   Cervical / Trunk Assessment Cervical / Trunk Assessment: Other exceptions Cervical / Trunk Exceptions: adhered to back precautions   Communication Communication Communication: No difficulties   Cognition Arousal/Alertness: Awake/alert Behavior During Therapy: WFL for tasks assessed/performed Overall Cognitive Status: Within Functional Limits for tasks assessed  General Comments  resting tremor in LUE (pt and daughter attribute to early onset Parkinson's)(pt has 2 dogs, educated about modifications to care for dogs)    Exercises     Shoulder Instructions      Home Living Family/patient expects to be discharged to:: Private residence Living Arrangements: Spouse/significant other Available Help at Discharge: Family Type of Home: [place]: One level     Bathroom Shower/Tub: Teacher, early years/pre: Handicapped height Bathroom Accessibility:  Yes How Accessible: Accessible via walker Home Equipment: Environmental consultant - standard   Additional Comments: Pt and his wife live two streets away from daughter and grandchildren who are readily available to help at discharge(Wife is able to assist with light home management. )      Prior Functioning/Environment Level of Independence: Independent        Comments: pt was working for [employer]        OT Problem List: Decreased strength;Decreased range of motion;Decreased activity tolerance;Pain      OT Treatment/Interventions:      OT Goals(Current goals can be found in the care plan section)    OT Frequency:     Barriers to D/C:            Co-evaluation              AM-PAC PT "6 Clicks" Daily Activity     Outcome Measure Help from another person eating meals?: None Help from another person taking care of personal grooming?: A Little Help from another person toileting, which includes using toliet, bedpan, or urinal?: A Little Help from another person bathing (including washing, rinsing, drying)?: A Little Help from another person to put on and taking off regular upper body clothing?: None Help from another person to put on and taking off regular lower body clothing?: None 6 Click Score: 21   End of Session Equipment Utilized During Treatment: Other (comment)(reacher, long handle shoe horn, sock aide) Nurse Communication: Mobility status  Activity Tolerance: Patient tolerated treatment well Patient left: in bed;with call bell/phone within reach;with family/visitor present  OT Visit Diagnosis: Other abnormalities of gait and mobility (R26.89)                Time: 6712-4580 OT Time Calculation (min): 46 min Charges:  OT General Charges $OT Visit: 1 Visit OT Evaluation $OT Eval Low Complexity: 1 Low OT Treatments $Self Care/Home Management : 23-37 mins G-Codes:       Dorinda Hill OTS  [phone]   01/15/2018, 3:11 PM

## 2018-01-15 NOTE — Progress Notes (Signed)
Postop check.  Patient doing very well postoperatively.  Preoperative pain resolved.  No radicular symptoms.  Ambulating with out difficulty.  Planning on discharge home in the morning.

## 2018-01-15 NOTE — Plan of Care (Signed)

## 2018-01-15 NOTE — Anesthesia Procedure Notes (Signed)
Procedure Name: Intubation Date/Time: 01/15/2018 8:09 AM Performed by: Lance Coon, CRNA Pre-anesthesia Checklist: Patient identified, Emergency Drugs available, Suction available, Patient being monitored and Timeout performed Patient Re-evaluated:Patient Re-evaluated prior to induction Oxygen Delivery Method: Circle system utilized Preoxygenation: Pre-oxygenation with 100% oxygen Induction Type: IV induction Ventilation: Mask ventilation without difficulty Laryngoscope Size: Miller and 3 Grade View: Grade I Tube type: Oral Tube size: 7.5 mm Number of attempts: 1 Airway Equipment and Method: Stylet Placement Confirmation: ETT inserted through vocal cords under direct vision,  positive ETCO2 and breath sounds checked- equal and bilateral Secured at: 23 cm Tube secured with: Tape Dental Injury: Teeth and Oropharynx as per pre-operative assessment

## 2018-01-15 NOTE — Brief Op Note (Signed)
01/15/2018  9:10 AM  PATIENT:  Adam Kane  84 y.o. male  PRE-OPERATIVE DIAGNOSIS:  Herniated Nucleus Pulposus  POST-OPERATIVE DIAGNOSIS:  Herniated Nucleus Pulposus  PROCEDURE:  Procedure(s) with comments: Microdiscectomy - left - Lumbar Four -Lumbar Five extraforaminal (Left) - Microdiscectomy - left - Lumbar Four -Lumbar Five extraforaminal  SURGEON:  Surgeon(s) and Role:    Earnie Larsson, MD - Primary  PHYSICIAN ASSISTANT:   ASSISTANTS:    ANESTHESIA:   general  EBL:  50 mL   BLOOD ADMINISTERED:none  DRAINS: none   LOCAL MEDICATIONS USED:  MARCAINE     SPECIMEN:  No Specimen  DISPOSITION OF SPECIMEN:  N/A  COUNTS:  YES  TOURNIQUET:  * No tourniquets in log *  DICTATION: .Dragon Dictation  PLAN OF CARE: Admit for overnight observation  PATIENT DISPOSITION:  PACU - hemodynamically stable.   Delay start of Pharmacological VTE agent (>24hrs) due to surgical blood loss or risk of bleeding: yes

## 2018-01-15 NOTE — Anesthesia Preprocedure Evaluation (Signed)
Anesthesia Evaluation  Patient identified by MRN, date of birth, ID band Patient awake    Reviewed: Allergy & Precautions, NPO status , Patient's Chart, lab work & pertinent test results  History of Anesthesia Complications (+) PONV and history of anesthetic complications  Airway Mallampati: II  TM Distance: >3 FB Neck ROM: Full    Dental  (+) Teeth Intact,    Pulmonary neg shortness of breath, neg sleep apnea, neg COPD, neg recent URI, former smoker,    breath sounds clear to auscultation       Cardiovascular hypertension, Pt. on medications and Pt. on home beta blockers + CAD, + CABG and + Peripheral Vascular Disease  + dysrhythmias Atrial Fibrillation + Valvular Problems/Murmurs  Rhythm:Regular     Neuro/Psych  Neuromuscular disease negative psych ROS   GI/Hepatic Neg liver ROS, GERD  Medicated and Controlled,  Endo/Other    Renal/GU Renal InsufficiencyRenal disease     Musculoskeletal  (+) Arthritis ,   Abdominal   Peds  Hematology negative hematology ROS (+)   Anesthesia Other Findings Pt had US aorta 01/14/18 that showed:  1. Slight increase in size of distal abdominal aortic aneurysm now measuring 3.9 x 3.6 cm. Recommend followup by ultrasound in 2 years   DISCUSSION:  - Pt is an 84 year old male with hx CAD (s/p CABG), chronic afib, AAA (3.4 by 2015 CT)  - Last dose ASA and coumadin 01/09/18  - Has cardiac clearance at increased risk with no options for reducing risk  - Reviewed case with Dr. Therisa Doyne. Pt will need to have AAA imaged prior to surgery. I notified [name] in Dr. Marchelle Folks office.     VS: BP (!) 104/59   Pulse 80   Temp 36.7 C   Resp 20   Ht 6\' 3"  (1.905 m)   Wt 223 lb 12.8 oz (101.5 kg)   SpO2 95%   BMI 27.97 kg/m   PROVIDERS: PCP is Crist Infante, MD  Cardiologist is Sanda Klein, MD. Last office visit 07/10/17. Pt cleared for surgery "at increased risk for  cardiac complications but" "there is anything we can do to reduce this risk, therefore he is cleared to proceed" by Melina Copa, PA/Dr. Sallyanne Kuster 01/08/18     LABS:  - PT 23.1. Will recheck day of surgery  (all labs ordered are listed, but only abnormal results are displayed)  Labs Reviewed PROTIME-INR - Abnormal; Notable for the following components:     Result Value   Prothrombin Time 23.1 (*)   All other components within normal limits SURGICAL PCR SCREEN BASIC METABOLIC PANEL CBC WITH DIFFERENTIAL/PLATELET   IMAGING:   CT renal stone study 08/18/14:  - 3 mm distal right ureteral calculus at the UVJ. Mild right hydroureteronephrosis. - 3.4 x 3.4 cm infrarenal abdominal aortic aneurysm   EKG 07/30/17: Atrial fibrillation (87 bpm).  Horizontal axis.  Slight left precordial repolarization disturbance, consider ischemia, LV overload or a specific change   CV:  Echo 07/04/16:  - Left ventricle: The cavity size was normal. There was mildconcentric hypertrophy. Systolic function was normal. Theestimated ejection fraction was in the range of 55% to 60%. Wallmotion was normal; there were no regional wall motionabnormalities. - Ventricular septum: Septal motion showed paradox. - Left atrium: The atrium was severely dilated. - Impressions: Heterogenous liver parenchyma, possibly multiple cysts.(Note rapid ventricular rates (100-122) during the study.)   Past Medical History: Diagnosis Date . A-fib ([place])  . Abdominal aortic aneurysm (AAA), 30-34 mm diameter (HCC)  .  Arthritis  . CAD (coronary artery disease)  . Cancer [facility])   prostate cancer 2009 . Chronic kidney disease   kidney stone . Complication of anesthesia   if given anesthesia too quickly experiences nausea and vomiting  . Diverticulosis of colon without hemorrhage  . Dyslipidemia  . Dysrhythmia  . Early-onset Parkinson's disease ([place])  . GERD (gastroesophageal reflux  disease)  . Heart murmur   childhood . History of kidney stones  . PONV (postoperative nausea and vomiting)  . S/P CABG (coronary artery bypass graft) 2001 . Shortness of breath   mild exertion due to afib . Systemic hypertension  . Urinary leakage    Past Surgical History: Procedure Laterality Date . APPENDECTOMY   . CARDIAC CATHETERIZATION  02/20/94 . CATARACT EXTRACTION, BILATERAL   . CHOLECYSTECTOMY   . CORONARY ARTERY BYPASS GRAFT  2001  LIMA to LAD,SVG to PDA . CORONARY ARTERY BYPASS GRAFT    2 vessel 2001 . CYSTOSCOPY WITH RETROGRADE PYELOGRAM, URETEROSCOPY AND STENT PLACEMENT Left 05/18/2014  Procedure: CYSTOSCOPY WITH RETROGRADE PYELOGRAM, URETEROSCOPY AND STENT PLACEMENT;  Surgeon: Raynelle Bring, MD;  [facility];  Service: Urology;  Laterality: Left; . CYSTOSCOPY WITH RETROGRADE PYELOGRAM, URETEROSCOPY AND STENT PLACEMENT Right 08/21/2014  Procedure: CYSTOSCOPY WITH RETROGRADE PYELOGRAM, URETEROSCOPY AND STENT PLACEMENT WITH STONE EXTRACTION basket;  Surgeon: Malka So, MD;  Location: [facility];  Service: Urology;  Laterality: Right; . HERNIA REPAIR   . HOLMIUM LASER APPLICATION Left 6/43/3295  Procedure: HOLMIUM LASER APPLICATION;  Surgeon: Raynelle Bring, MD;  Location: [facility];  Service: Urology;  Laterality: Left; . LUMBAR LAMINECTOMY/DECOMPRESSION MICRODISCECTOMY Left 03/07/2013  Procedure: LUMBAR FOUR-FIVE EXTRAFORAMINAL LUMBAR LAMINECTOMY/DECOMPRESSION MICRODISCECTOMY 1 LEVEL;  Surgeon: Charlie Pitter, MD;  Location: [place] [facility] ORS;  Service: Neurosurgery;  Laterality: Left;  Left lumbar four-five extraforaminal microdiskectomy . PROSTATECTOMY   . TONSILLECTOMY     MEDICATIONS: . aspirin EC 81 MG tablet . atorvastatin (LIPITOR) 40 MG tablet . cholecalciferol (VITAMIN D) 1000 UNITS tablet . digoxin (LANOXIN) 0.125 MG tablet . diltiazem (TIAZAC) 240 MG 24 hr capsule . ezetimibe (ZETIA) 10 MG tablet . metoprolol tartrate  (LOPRESSOR) 25 MG tablet . omeprazole (PRILOSEC) 20 MG capsule . vitamin B-12 (CYANOCOBALAMIN) 1000 MCG tablet . warfarin (COUMADIN) 2 MG tablet  No current facility-administered medications for this encounter.   - Last dose coumadin and ASA 01/09/18.      Reproductive/Obstetrics                             Anesthesia Physical Anesthesia Plan  ASA: III  Anesthesia Plan: General   Post-op Pain Management:    Induction: Intravenous  PONV Risk Score and Plan: 3 and Ondansetron, Dexamethasone and Propofol infusion  Airway Management Planned: Oral ETT  Additional Equipment: None  Intra-op Plan:   Post-operative Plan: Extubation in OR  Informed Consent: I have reviewed the patients History and Physical, chart, labs and discussed the procedure including the risks, benefits and alternatives for the proposed anesthesia with the patient or authorized representative who has indicated his/her understanding and acceptance.   Dental advisory given  Plan Discussed with: CRNA and Surgeon  Anesthesia Plan Comments:         Anesthesia Quick Evaluation

## 2018-01-15 NOTE — Transfer of Care (Signed)
Immediate Anesthesia Transfer of Care Note  Patient: Adam Kane  Procedure(s) Performed: Microdiscectomy - left - Lumbar Four -Lumbar Five extraforaminal (Left Back)  Patient Location: PACU  Anesthesia Type:General  Level of Consciousness: awake and patient cooperative  Airway & Oxygen Therapy: Patient Spontanous Breathing  Post-op Assessment: Report given to RN and Post -op Vital signs reviewed and stable  Post vital signs: Reviewed and stable  Last Vitals:  Vitals Value Taken Time  BP 130/74 01/15/2018  9:22 AM  Temp    Pulse 73 01/15/2018  9:23 AM  Resp 13 01/15/2018  9:23 AM  SpO2 94 % 01/15/2018  9:23 AM  Vitals shown include unvalidated device data.  Last Pain:  Vitals:   01/15/18 0630  TempSrc: Oral         Complications: No apparent anesthesia complications

## 2018-01-15 NOTE — Op Note (Signed)
Date of procedure: 01/15/2018   Date of dictation: Same  Service: Neurosurgery  Preoperative diagnosis: Left L4-L5 recurrent extraforaminal herniated nucleus pulposus with radiculopathy  Postoperative diagnosis: Same  Procedure Name: Reexploration of left L4-L5 extraforaminal space with redo microdiscectomy  Surgeon:Rossi Burdo A.Ourania Hamler, M.D.  Asst. Surgeon: None  Anesthesia: General  Indication: 84 year old male status post prior left-sided L4-5 extra foraminal microdiscectomy with good results many years in the past.  Patient presents now with extreme left lower back and radicular pain failing conservative management.  Work-up demonstrates evidence of a large left L4-5 recurrent extraforaminal disc herniation with marked compression of his left L4 nerve root.  Patient has failed conservative management and presents now for redo microdiscectomy in hopes of improving his symptoms.  Operative note: After induction of anesthesia, patient position prone on the Wilson frame and appropriately padded.  Lumbar region prepped and draped sterilely.  Incision made overlying L4-5.  Dissection performed on the left.  Retractor placed.  X-ray taken.  Level confirmed.  Extraforaminal space was cleaned of investing scar tissue.  The superior facet of L5 was dissected free and further resected as was a small portion of the inferior lateral aspect of the inferior facet of L4 and a small area of the lateral pars interarticularis of L4.  Microscope brought in field using microdissection of the extraforaminal space.  The nerve root was dissected and identified.  A large amount of free disc herniation was then dissected and removed and several very large fragments decompressing the nerve root.  The disc space itself was entered.  I evacuated all loose or obviously degenerative disc from the interspace.  At this point a very thorough discectomy been achieved.  There was no evidence of injury to the thecal sac or nerve roots.   The wound was then irrigated fanlike solution.  Gelfoam was placed topically for hemostasis.  Wound was then irrigated fanlike solution.  Gelfoam was placed topically for hemostasis.  Wounds and closed in layers with Vicryl sutures.  Steri-Strips and sterile dressing were applied.  No apparent complications.  Patient tolerated the procedure well and he returns to the recovery room postop.

## 2018-01-15 NOTE — H&P (Signed)
Adam Kane is an 84 y.o. male.   Chief Complaint: Back pain HPI: 84 year old male status post prior left-sided L4-5 extraforaminal microdiscectomy presents with severe recurrent left-sided back pain with radiation to his left lower extremity.  Symptoms greatly increased with attempts to stand and walk.  Patient has not responded to conservative management.  He is miserable with intractable pain.  Patient presents now for redo extraforaminal microdiscectomy for treatment of a significant recurrent disc herniation extraforaminal 8 L4-5 on the left.  Past Medical History:  Diagnosis Date  . A-fib ([place])   . Abdominal aortic aneurysm (AAA), 30-34 mm diameter ([facility])   . Arthritis   . CAD (coronary artery disease)   . Cancer [facility])    prostate cancer 2009  . Chronic kidney disease    kidney stone  . Complication of anesthesia    if given anesthesia too quickly experiences nausea and vomiting   . Diverticulosis of colon without hemorrhage   . Dyslipidemia   . Dysrhythmia   . Early-onset Parkinson's disease ([place])   . GERD (gastroesophageal reflux disease)   . Heart murmur    childhood  . History of kidney stones   . PONV (postoperative nausea and vomiting)   . S/P CABG (coronary artery bypass graft) 2001  . Shortness of breath    mild exertion due to afib  . Systemic hypertension   . Urinary leakage     Past Surgical History:  Procedure Laterality Date  . APPENDECTOMY    . CARDIAC CATHETERIZATION  02/20/94  . CATARACT EXTRACTION, BILATERAL    . CHOLECYSTECTOMY    . CORONARY ARTERY BYPASS GRAFT  2001   LIMA to LAD,SVG to PDA  . CORONARY ARTERY BYPASS GRAFT     2 vessel 2001  . CYSTOSCOPY WITH RETROGRADE PYELOGRAM, URETEROSCOPY AND STENT PLACEMENT Left 05/18/2014   Procedure: CYSTOSCOPY WITH RETROGRADE PYELOGRAM, URETEROSCOPY AND STENT PLACEMENT;  Surgeon: Raynelle Bring, MD;  Location: [facility];  Service: Urology;  Laterality: Left;  . CYSTOSCOPY WITH RETROGRADE PYELOGRAM,  URETEROSCOPY AND STENT PLACEMENT Right 08/21/2014   Procedure: CYSTOSCOPY WITH RETROGRADE PYELOGRAM, URETEROSCOPY AND STENT PLACEMENT WITH STONE EXTRACTION basket;  Surgeon: Malka So, MD;  Location: [facility];  Service: Urology;  Laterality: Right;  . HERNIA REPAIR    . HOLMIUM LASER APPLICATION Left 6/60/6301   Procedure: HOLMIUM LASER APPLICATION;  Surgeon: Raynelle Bring, MD;  Location: [facility];  Service: Urology;  Laterality: Left;  . LUMBAR LAMINECTOMY/DECOMPRESSION MICRODISCECTOMY Left 03/07/2013   Procedure: LUMBAR FOUR-FIVE EXTRAFORAMINAL LUMBAR LAMINECTOMY/DECOMPRESSION MICRODISCECTOMY 1 LEVEL;  Surgeon: Charlie Pitter, MD;  Location: [place] [facility];  Service: Neurosurgery;  Laterality: Left;  Left lumbar four-five extraforaminal microdiskectomy  . PROSTATECTOMY    . TONSILLECTOMY      Family History  Problem Relation Age of Onset  . Cancer Mother        bone  . Breast cancer Mother   . AAA (abdominal aortic aneurysm) Father   . Throat cancer Father        mouth   Social History:  reports that he quit smoking about 44 years ago. His smoking use included cigarettes. He has never used smokeless tobacco. He reports that he does not drink alcohol or use drugs.  Allergies:  Allergies  Allergen Reactions  . Dilaudid [Hydromorphone Hcl] Other (See Comments)    PASSES OUT  . Morphine And Related Other (See Comments)    PASSES OUT    Medications Prior to Admission  Medication Sig Dispense  Refill  . atorvastatin (LIPITOR) 40 MG tablet Take 40 mg by mouth at bedtime.     . cholecalciferol (VITAMIN D) 1000 UNITS tablet Take 1,000 Units by mouth daily.    . digoxin (LANOXIN) 0.125 MG tablet Take 1 tablet (0.125 mg total) daily by mouth. 90 tablet 3  . diltiazem (TIAZAC) 240 MG 24 hr capsule Take 240 mg by mouth every morning.     . ezetimibe (ZETIA) 10 MG tablet Take 10 mg by mouth every morning.     . metoprolol tartrate (LOPRESSOR) 25 MG tablet Take 25 mg by mouth 2 (two) times daily.     Marland Kitchen omeprazole (PRILOSEC) 20 MG capsule Take 20 mg by mouth daily.     . vitamin B-12 (CYANOCOBALAMIN) 1000 MCG tablet Take 1,000 mcg by mouth daily.    Marland Kitchen aspirin EC 81 MG tablet Take 81 mg by mouth daily.    Marland Kitchen warfarin (COUMADIN) 2 MG tablet Take 2 mg by mouth daily at 6 PM. Takes 1 tab daily on Monday Wednesday and  takes 1.5 tab all other days      No results found for this or any previous visit (from the past 48 hour(s)). US Aorta Complete  Result Date: 01/14/2018 CLINICAL DATA:  History of prostate carcinoma and tobacco use for many years, hypertension, syncope, evaluate for abdominal aortic aneurysm EXAM: ULTRASOUND OF ABDOMINAL AORTA TECHNIQUE: Ultrasound examination of the abdominal aorta was performed to evaluate for abdominal aortic aneurysm. COMPARISON:  CT abdomen and pelvis of 08/18/2014 FINDINGS: Abdominal aortic measurements as follows: Proximal:  3.5 x 2.8 cm Mid:  2.2 x 2.2 cm Distal:  3.9 x 3.6 cm There is a distal abdominal aortic aneurysm of 3.9 x 3.6 cm. On the prior CT 2015, this distal abdominal aortic aneurysm measures 3.4 cm. Recommend followup by ultrasound in 2 years. This recommendation follows ACR consensus guidelines: White Paper of the ACR Incidental Findings Committee II on Vascular Findings. J Am Coll Radiol 2013; 10:789-794. IMPRESSION: 1. Slight increase in size of distal abdominal aortic aneurysm now measuring 3.9 x 3.6 cm. Recommend followup by ultrasound in 2 years. This recommendation follows ACR consensus guidelines: White Paper of the ACR Incidental Findings Committee II on Vascular Findings. J Am Coll Radiol 2013; 10:789-794. Electronically Signed   By: Ivar Drape M.D.   On: 01/14/2018 09:35      Blood pressure (!) 152/99, pulse 74, temperature 98.7 F (37.1 C), temperature source Oral, resp. rate 20, height 6\' 3"  (1.905 m), weight 101.5 kg (223 lb 12.8 oz), SpO2 98 %.  Patient is awake and alert.  He is oriented and appropriate.  Speech is fluent.   Judgment and insight are intact.  Cranial nerve function normal bilaterally.  Motor examination reveals weakness of his left-sided quadriceps muscle group with some wasting.  Straight leg raising is strongly positive on the left equivocal on the right.  Sensory examination with decreased sensation pinprick light touch in his left L4 dermatome.  Deep tendon reflexes hypoactive in both lower extremities with his left Achilles and patellar reflexes absent.  No evidence of long track signs.  Patient with a resting parkinsonian tremor.  Examination head ears eyes nose throat is unremarkable her chest and abdomen are benign.  Extremities are free from injury deformity. Assessment/Plan Left L4-5 recurrent extraforaminal herniated mucous pulposis with severe radiculopathy.  Plan left L4-5 reexploration of extra foraminal space with redo microdiscectomy.  Risks and benefits of been explained.  Patient wishes to proceed.  Mallie Mussel A Laylana Gerwig 01/15/2018, 7:48 AM

## 2018-01-16 DIAGNOSIS — M5116 Intervertebral disc disorders with radiculopathy, lumbar region: Secondary | ICD-10-CM | POA: Diagnosis not present

## 2018-01-16 MED ORDER — HYDROCODONE-ACETAMINOPHEN 5-325 MG PO TABS: 1.0000 | ORAL_TABLET | ORAL | 0 refills | Status: DC | PRN

## 2018-01-16 NOTE — Discharge Instructions (Signed)
Wound Care Keep incision covered and dry for two days.  If you shower, cover incision with plastic wrap.  Do not put any creams, lotions, or ointments on incision. Leave steri-strips on back.  They will fall off by themselves.  Activity Walk each and every day, increasing distance each day. No lifting greater than 5 lbs.  Avoid excessive neck motion. No driving for 2 weeks; may ride as a passenger locally.  Diet Resume your normal diet.   Return to Work Will be discussed at you follow up appointment.  Call Your Doctor If Any of These Occur Redness, drainage, or swelling at the wound.  Temperature greater than 101 degrees. Severe pain not relieved by pain medication. Incision starts to come apart.  Start Coumadin on Monday, May 20th  Follow Up Appt Call today for appointment in 1-2 weeks ([phone]) or for problems.  If you have any hardware placed in your spine, you will need an x-ray before your appointment.

## 2018-01-16 NOTE — Anesthesia Postprocedure Evaluation (Signed)
Anesthesia Post Note  Patient: Adam Kane  Procedure(s) Performed: Microdiscectomy - left - Lumbar Four -Lumbar Five extraforaminal (Left Back)     Patient location during evaluation: PACU Anesthesia Type: General Level of consciousness: awake and alert Pain management: pain level controlled Vital Signs Assessment: post-procedure vital signs reviewed and stable Respiratory status: spontaneous breathing, nonlabored ventilation, respiratory function stable and patient connected to nasal cannula oxygen Cardiovascular status: blood pressure returned to baseline and stable Postop Assessment: no apparent nausea or vomiting Anesthetic complications: no    Last Vitals:  Vitals:   01/16/18 0347 01/16/18 0754  BP: 119/66 (!) 141/87  Pulse: 95 97  Resp: 18 18  Temp: 36.9 C 36.9 C  SpO2: 95% 97%    Last Pain:  Vitals:   01/16/18 0800  TempSrc:   PainSc: 1                  Adam Kane

## 2018-01-16 NOTE — Progress Notes (Signed)
Patient is discharged from room 3C02 at this time. Alert and in stable condition. IV site d/c'd and instructions read to patient and daughter with understanding verbalized. Left unit via wheelchair with all belongings at side. 

## 2018-01-16 NOTE — Discharge Summary (Signed)
Physician Discharge Summary  Patient ID: Adam Kane MRN: [id number] DOB/AGE: September 27, 1932 84 y.o.  Admit date: 01/15/2018 Discharge date: 01/16/2018  Admission Diagnoses: HNP    Discharge Diagnoses: same   Discharged Condition: good  Hospital Course: The patient was admitted on 01/15/2018 and taken to the operating room where the patient underwent microdiskectomy. The patient tolerated the procedure well and was taken to the recovery room and then to the floor in stable condition. The hospital course was routine. There were no complications. The wound remained clean dry and intact. Pt had appropriate back soreness. No complaints of leg pain or new N/T/W. The patient remained afebrile with stable vital signs, and tolerated a regular diet. The patient continued to increase activities, and pain was well controlled with oral pain medications.   Consults: None  Significant Diagnostic Studies:  Results for orders placed or performed during the hospital encounter of 01/15/18  Protime-INR  Result Value Ref Range   Prothrombin Time 16.2 (H) 11.4 - 15.2 seconds   INR 1.31     Mr Lumbar Spine Wo Contrast  Result Date: 12/29/2017 CLINICAL DATA:  84 y/o M; severe lower back pain with radiculopathy to the left hip and leg. EXAM: MRI LUMBAR SPINE WITHOUT CONTRAST TECHNIQUE: Multiplanar, multisequence MR imaging of the lumbar spine was performed. No intravenous contrast was administered. COMPARISON:  08/18/2014 CT abdomen and pelvis. FINDINGS: Segmentation:  Standard. Alignment:  Physiologic. Vertebrae:  No fracture, evidence of discitis, or bone lesion. Conus medullaris and cauda equina: Conus extends to the L1 level. Conus and cauda equina appear normal. Paraspinal and other soft tissues: Partially visualized well-circumscribed right kidney lower pole T2 hyperintense structure, likely cyst. Disc levels: L1-2: No significant disc displacement, foraminal stenosis, or canal stenosis. L2-3: Minimal  disc bulge. No significant foraminal or canal stenosis. L3-4: Mild disc bulge and facet hypertrophy. Mild bilateral foraminal stenosis. No canal stenosis. L4-5: Mild diffuse disc bulge with left foraminal/extraforaminal annular fissure and small protrusion combined with mild facet and ligamentum flavum hypertrophy. Trace facet effusions, likely degenerative. The disc protrusion impinges the exiting left L4 nerve root within the outer left foraminal zone (series 4, image 15 and series 3, image 15). L5-S1: Mild disc bulge with right greater than left facet hypertrophy. Small right subarticular annular fissure. Mild right foraminal stenosis. No significant canal stenosis. IMPRESSION: 1. Left L4-5 foraminal/extraforaminal small disc protrusion impinging the exiting left L4 nerve root in the outer left foraminal zone. 2. Otherwise mild lumbar spondylosis with multiple levels of mild foraminal stenosis. No high-grade foraminal or canal stenosis. These results were called by telephone at the time of interpretation on 12/29/2017 at 8:00pm to Dr. Crist Infante , who verbally acknowledged these results. Electronically Signed   By: Kristine Garbe M.D.   On: 12/29/2017 20:14   Dg Lumbar Spine 1 View  Result Date: 01/15/2018 CLINICAL DATA:  Portable lateral lumbar spine imaging for surgical localization. EXAM: LUMBAR SPINE - 1 VIEW COMPARISON:  Lumbar MRI, 12/29/2017. FINDINGS: A surgical probe has been inserted, with its tip projecting 3 cm posterior to the posterior margin of the L4-L5 disc. IMPRESSION: Surgical localization imaging as described. Electronically Signed   By: Lajean Manes M.D.   On: 01/15/2018 09:54   US Aorta Complete  Result Date: 01/14/2018 CLINICAL DATA:  History of prostate carcinoma and tobacco use for many years, hypertension, syncope, evaluate for abdominal aortic aneurysm EXAM: ULTRASOUND OF ABDOMINAL AORTA TECHNIQUE: Ultrasound examination of the abdominal aorta was performed to  evaluate for  abdominal aortic aneurysm. COMPARISON:  CT abdomen and pelvis of 08/18/2014 FINDINGS: Abdominal aortic measurements as follows: Proximal:  3.5 x 2.8 cm Mid:  2.2 x 2.2 cm Distal:  3.9 x 3.6 cm There is a distal abdominal aortic aneurysm of 3.9 x 3.6 cm. On the prior CT 2015, this distal abdominal aortic aneurysm measures 3.4 cm. Recommend followup by ultrasound in 2 years. This recommendation follows ACR consensus guidelines: White Paper of the ACR Incidental Findings Committee II on Vascular Findings. J Am Coll Radiol 2013; 10:789-794. IMPRESSION: 1. Slight increase in size of distal abdominal aortic aneurysm now measuring 3.9 x 3.6 cm. Recommend followup by ultrasound in 2 years. This recommendation follows ACR consensus guidelines: White Paper of the ACR Incidental Findings Committee II on Vascular Findings. J Am Coll Radiol 2013; 10:789-794. Electronically Signed   By: Ivar Drape M.D.   On: 01/14/2018 09:35    Antibiotics:  Anti-infectives (From admission, onward)   Start     Dose/Rate Route Frequency Ordered Stop   01/15/18 1600  ceFAZolin (ANCEF) IVPB 1 g/50 mL premix     1 g 100 mL/hr over 30 Minutes Intravenous Every 8 hours 01/15/18 1039 01/15/18 2352   01/15/18 0830  bacitracin 50,000 Units in sodium chloride 0.9 % 500 mL irrigation  Status:  Discontinued       As needed 01/15/18 0830 01/15/18 0918   01/15/18 0645  ceFAZolin (ANCEF) IVPB 2g/100 mL premix     2 g 200 mL/hr over 30 Minutes Intravenous On call to O.R. 01/15/18 6063 01/15/18 0816      Discharge Exam: Blood pressure 119/66, pulse 95, temperature 98.5 F (36.9 C), temperature source Oral, resp. rate 18, height 6\' 3"  (1.905 m), weight 101.5 kg (223 lb 12.8 oz), SpO2 95 %. Neurologic: Grossly normal Dressing dry  Discharge Medications:   Allergies as of 01/16/2018      Reactions   Dilaudid [hydromorphone Hcl] Other (See Comments)   PASSES OUT   Morphine And Related Other (See Comments)   PASSES OUT       Medication List    TAKE these medications   aspirin EC 81 MG tablet Take 81 mg by mouth daily.   atorvastatin 40 MG tablet Commonly known as:  LIPITOR Take 40 mg by mouth at bedtime.   cholecalciferol 1000 units tablet Commonly known as:  VITAMIN D Take 1,000 Units by mouth daily.   digoxin 0.125 MG tablet Commonly known as:  LANOXIN Take 1 tablet (0.125 mg total) daily by mouth.   diltiazem 240 MG 24 hr capsule Commonly known as:  TIAZAC Take 240 mg by mouth every morning.   ezetimibe 10 MG tablet Commonly known as:  ZETIA Take 10 mg by mouth every morning.   HYDROcodone-acetaminophen 5-325 MG tablet Commonly known as:  NORCO/VICODIN Take 1 tablet by mouth every 4 (four) hours as needed for moderate pain ((score 4 to 6)).   metoprolol tartrate 25 MG tablet Commonly known as:  LOPRESSOR Take 25 mg by mouth 2 (two) times daily.   omeprazole 20 MG capsule Commonly known as:  PRILOSEC Take 20 mg by mouth daily.   vitamin B-12 1000 MCG tablet Commonly known as:  CYANOCOBALAMIN Take 1,000 mcg by mouth daily.   warfarin 2 MG tablet Commonly known as:  COUMADIN Take 2 mg by mouth daily at 6 PM. Takes 1 tab daily on Monday Wednesday and  takes 1.5 tab all other days       Disposition: home  Final Dx: microdiskectomy  Discharge Instructions     Remove dressing in 72 hours   Complete by:  As directed    Call MD for:  difficulty breathing, headache or visual disturbances   Complete by:  As directed    Call MD for:  persistant nausea and vomiting   Complete by:  As directed    Call MD for:  redness, tenderness, or signs of infection (pain, swelling, redness, odor or green/yellow discharge around incision site)   Complete by:  As directed    Call MD for:  severe uncontrolled pain   Complete by:  As directed    Call MD for:  temperature >100.4   Complete by:  As directed    Diet - low sodium heart healthy   Complete by:  As directed    Increase activity slowly    Complete by:  As directed          Signed: Mate Alegria S 01/16/2018, 7:26 AM

## 2018-01-17 ENCOUNTER — Encounter (HOSPITAL_COMMUNITY): Payer: Self-pay | Admitting: Neurosurgery

## 2018-01-18 MED FILL — Thrombin For Soln 5000 Unit: CUTANEOUS | Qty: 5000 | Status: AC

## 2018-01-29 ENCOUNTER — Ambulatory Visit: Payer: Medicare Other | Admitting: Neurology

## 2018-03-05 NOTE — Progress Notes (Signed)
Adam Kane was seen today in the movement disorders clinic for neurologic consultation at the request of Crist Infante, MD.  The consultation is for the evaluation of tremor and to r/o PD. This patient is accompanied in the office by his daughter who supplements the history.  03/09/18 update: Patient is seen today in follow-up for newly diagnosed Parkinson's disease.  He is accompanied by his daughter who supplements the history.  Parkinson's was diagnosed last November.  He had to cancel his follow-up, so today is the first day in which I have seen him back.  Medications were declined last visit.  Overall, the patient reports that he has been doing well.  Medical records are reviewed.  He had surgery with Dr. Annette Stable on Jan 15, 2018.  Microdiscectomy at the L4-L5 level was completed.  Recovered well.  No falls since surgery.  Had falls prior to surgery.  He calls it loss of consciousness but daughter thinks that it was just loss of balance.  Daughter does state that blood pressure was low and Dr. Joylene Draft has been lowering metoprolol because of this.  Much better since lowering metoprolol - now at 25 mg - 1/2 po bid.     Had vertigo early in the year (Jan, 2019).  Had hematuria from coumadin since last visit.  Hasn't been able to do much exercise since back surgery but has been released by neurosx to do exercise.  Asks about filling out form for [employer] cycle class.  Still having some back/buttocks pain.  PREVIOUS MEDICATIONS: none to date  ALLERGIES:   Allergies  Allergen Reactions  . Dilaudid [Hydromorphone Hcl] Other (See Comments)    PASSES OUT  . Morphine And Related Other (See Comments)    PASSES OUT    CURRENT MEDICATIONS:  Outpatient Encounter Medications as of 03/09/2018  Medication Sig  . aspirin EC 81 MG tablet Take 81 mg by mouth daily.  Marland Kitchen atorvastatin (LIPITOR) 40 MG tablet Take 40 mg by mouth at bedtime.   . cholecalciferol (VITAMIN D) 1000 UNITS tablet Take 1,000 Units by mouth  daily.  . digoxin (LANOXIN) 0.125 MG tablet Take 1 tablet (0.125 mg total) daily by mouth.  . diltiazem (TIAZAC) 240 MG 24 hr capsule Take 240 mg by mouth every morning.   . ezetimibe (ZETIA) 10 MG tablet Take 10 mg by mouth every morning.   . metoprolol tartrate (LOPRESSOR) 25 MG tablet Take 25 mg by mouth 2 (two) times daily.  Marland Kitchen omeprazole (PRILOSEC) 20 MG capsule Take 20 mg by mouth daily.   . vitamin B-12 (CYANOCOBALAMIN) 1000 MCG tablet Take 1,000 mcg by mouth daily.  Marland Kitchen warfarin (COUMADIN) 2 MG tablet Take 2 mg by mouth daily at 6 PM. Takes 1 tab daily on Monday Wednesday and  takes 1.5 tab all other days  . [DISCONTINUED] HYDROcodone-acetaminophen (NORCO/VICODIN) 5-325 MG tablet Take 1 tablet by mouth every 4 (four) hours as needed for moderate pain ((score 4 to 6)).   No facility-administered encounter medications on file as of 03/09/2018.     PAST MEDICAL HISTORY:   Past Medical History:  Diagnosis Date  . A-fib ([place])   . Abdominal aortic aneurysm (AAA), 30-34 mm diameter (HCC)   . Arthritis   . CAD (coronary artery disease)   . Cancer [facility])    prostate cancer 2009  . Chronic kidney disease    kidney stone  . Complication of anesthesia    if given anesthesia too quickly experiences nausea and  vomiting   . Diverticulosis of colon without hemorrhage   . Dyslipidemia   . Dysrhythmia   . Early-onset Parkinson's disease ([place])   . GERD (gastroesophageal reflux disease)   . Heart murmur    childhood  . History of kidney stones   . PONV (postoperative nausea and vomiting)   . S/P CABG (coronary artery bypass graft) 2001  . Shortness of breath    mild exertion due to afib  . Systemic hypertension   . Urinary leakage     PAST SURGICAL HISTORY:   Past Surgical History:  Procedure Laterality Date  . APPENDECTOMY    . CARDIAC CATHETERIZATION  02/20/94  . CATARACT EXTRACTION, BILATERAL    . CHOLECYSTECTOMY    . CORONARY ARTERY BYPASS GRAFT  2001   LIMA to LAD,SVG to PDA  .  CORONARY ARTERY BYPASS GRAFT     2 vessel 2001  . CYSTOSCOPY WITH RETROGRADE PYELOGRAM, URETEROSCOPY AND STENT PLACEMENT Left 05/18/2014   Procedure: CYSTOSCOPY WITH RETROGRADE PYELOGRAM, URETEROSCOPY AND STENT PLACEMENT;  Surgeon: Raynelle Bring, MD;  Location: [facility];  Service: Urology;  Laterality: Left;  . CYSTOSCOPY WITH RETROGRADE PYELOGRAM, URETEROSCOPY AND STENT PLACEMENT Right 08/21/2014   Procedure: CYSTOSCOPY WITH RETROGRADE PYELOGRAM, URETEROSCOPY AND STENT PLACEMENT WITH STONE EXTRACTION basket;  Surgeon: Malka So, MD;  Location: [facility];  Service: Urology;  Laterality: Right;  . HERNIA REPAIR    . HOLMIUM LASER APPLICATION Left 1/61/0960   Procedure: HOLMIUM LASER APPLICATION;  Surgeon: Raynelle Bring, MD;  Location: [facility];  Service: Urology;  Laterality: Left;  . LUMBAR LAMINECTOMY/DECOMPRESSION MICRODISCECTOMY Left 03/07/2013   Procedure: LUMBAR FOUR-FIVE EXTRAFORAMINAL LUMBAR LAMINECTOMY/DECOMPRESSION MICRODISCECTOMY 1 LEVEL;  Surgeon: Charlie Pitter, MD;  Location: [place] [facility];  Service: Neurosurgery;  Laterality: Left;  Left lumbar four-five extraforaminal microdiskectomy  . LUMBAR LAMINECTOMY/DECOMPRESSION MICRODISCECTOMY Left 01/15/2018   Procedure: Microdiscectomy - left - Lumbar Four -Lumbar Five extraforaminal;  Surgeon: Earnie Larsson, MD;  Location: [place];  Service: Neurosurgery;  Laterality: Left;  Microdiscectomy - left - Lumbar Four -Lumbar Five extraforaminal  . PROSTATECTOMY    . TONSILLECTOMY      SOCIAL HISTORY:   Social History   Socioeconomic History  . Marital status: Married    Spouse name: Not on file  . Number of children: Not on file  . Years of education: Not on file  . Highest education level: Not on file  Occupational History  . Occupation: retired    Comment: Technical brewer  . Financial resource strain: Not on file  . Food insecurity:    Worry: Not on file    Inability: Not on file  . Transportation needs:    Medical: Not on file     Non-medical: Not on file  Tobacco Use  . Smoking status: Former Smoker    Types: Cigarettes    Last attempt to quit: 09/01/1973    Years since quitting: 44.5  . Smokeless tobacco: Never Used  Substance and Sexual Activity  . Alcohol use: No  . Drug use: No  . Sexual activity: Not on file  Lifestyle  . Physical activity:    Days per week: Not on file    Minutes per session: Not on file  . Stress: Not on file  Relationships  . Social connections:    Talks on phone: Not on file    Gets together: Not on file    Attends religious service: Not on file    Active member of club  or organization: Not on file    Attends meetings of clubs or organizations: Not on file    Relationship status: Not on file  . Intimate partner violence:    Fear of current or ex partner: Not on file    Emotionally abused: Not on file    Physically abused: Not on file    Forced sexual activity: Not on file  Other Topics Concern  . Not on file  Social History Narrative  . Not on file    FAMILY HISTORY:   Family Status  Relation Name Status  . Mother  Deceased  . Father  Deceased  . Daughter  Alive  . Son x2 Alive    ROS:  A complete 10 system review of systems was obtained and was unremarkable apart from what is mentioned above.  PHYSICAL EXAMINATION:    VITALS:   Vitals:   03/09/18 1349  BP: 120/70  Pulse: 72  SpO2: 93%  Weight: 232 lb (105.2 kg)  Height: 6\' 5"  (1.956 m)    GEN:  The patient appears stated age and is in NAD. HEENT:  Normocephalic, atraumatic.  The mucous membranes are moist. The superficial temporal arteries are without ropiness or tenderness. CV:  Irreg.  irreg Lungs:  CTAB Neck/HEME:  There are no carotid bruits bilaterally.  Neurological examination:  Orientation: The patient is alert and oriented x3. Cranial nerves: There is good facial symmetry. The speech is fluent and clear. Soft palate rises symmetrically and there is no tongue deviation. Hearing is intact to  conversational tone. Sensation: Sensation is intact to light touch throughout Motor: Strength is 5/5 in the bilateral upper and lower extremities.   Shoulder shrug is equal and symmetric.  There is no pronator drift.  Movement examination: Tone: There is normal tone in the RUE.   There is mild increased tone in the LUE  Abnormal movements: There is mild chin tremor.  There is L>RUE rest tremor.   Coordination:  There is mild decremation with RAM's, with heel and toe taps bilaterally, L more than R.   Gait and Station: The patient has mild difficulty arising out of a deep-seated chair without the use of the hands. The patient's stride length is decreased.  Shuffles the longer that he walks with slight dragging of the left leg.  Some re-emergent tremor of the L hand as he walks.     Labs:  Patient had lab work dated June 19, 2017.  Sodium was 141, potassium 4.1, chloride 106, CO2 26, BUN 19, creatinine 1.2, glucose 121, AST 21, ALT 22, alkaline phosphatase 105.  White blood cells were 5.4, hemoglobin 16.2, hematocrit 51.7 and platelets 197.  Total cholesterol was 130, HDL 31, LDL 79.  TSH was 2.13.  ASSESSMENT/PLAN:  1.  Idiopathic Parkinson's disease, diagnosed November, 2018  -Long talk with patient and his daughter.  I do think it would be advantageous to start medication now.  Patient was agreeable.  We will start carbidopa/levodopa 25/100 and work to 1 tablet 3 times per day.  Risks, benefits, side effects and alternative therapies were discussed.  The opportunity to ask questions was given and they were answered to the best of my ability.  The patient expressed understanding and willingness to follow the outlined treatment protocols.  -Forms given for [employer] cycle class, but the patient will need approval from cardiology.  -Safety discussed in detail.   2.  Low back pain, status post microdiscectomy in May, 2019.  -Patient is doing better  and still following with neurosurgery.  3.   Dizziness  -Much improved after Dr. Joylene Draft decreased patient's metoprolol.  Will continue to monitor.  Discussed effect of carbidopa/levodopa on BP and effect of PD on BP.  4.  Follow up is anticipated in the next few months, sooner should new neurologic issues arise.  Much greater than 50% of this visit was spent in counseling and coordinating care.  Total face to face time:  30 min  Cc:  Crist Infante, MD

## 2018-03-09 ENCOUNTER — Ambulatory Visit: Payer: Medicare Other | Admitting: Neurology

## 2018-03-09 ENCOUNTER — Encounter: Payer: Self-pay | Admitting: Neurology

## 2018-03-09 VITALS — BP 120/70 | HR 72 | Ht 77.0 in | Wt 232.0 lb

## 2018-03-09 DIAGNOSIS — G20 Parkinson's disease: Secondary | ICD-10-CM | POA: Diagnosis not present

## 2018-03-09 DIAGNOSIS — R42 Dizziness and giddiness: Secondary | ICD-10-CM | POA: Diagnosis not present

## 2018-03-09 MED ORDER — CARBIDOPA-LEVODOPA 25-100 MG PO TABS: 1.0000 | ORAL_TABLET | Freq: Three times a day (TID) | ORAL | 4 refills | Status: DC

## 2018-03-09 NOTE — Patient Instructions (Addendum)
Start Carbidopa Levodopa as follows:  Take 1/2 tablet three times daily, at least 30 minutes before meals, for one week  Then take 1/2 tablet in the morning, 1/2 tablet in the afternoon, 1 tablet in the evening, at least 30 minutes before meals, for one week  Then take 1/2 tablet in the morning, 1 tablet in the afternoon, 1 tablet in the evening, at least 30 minutes before meals, for one week  Then take 1 tablet three times daily, at least 30 minutes before meals   As a reminder, carbidopa/levodopa can be taken at the same time as a carbohydrate, but we like to have you take your pill either 30 minutes before a protein source or 1 hour after as protein can interfere with carbidopa/levodopa absorption.  Registration is OPEN!    Third Annual Parkinson's Education Symposium   To register: [email or web address]      Search:  [employer] person attending individually Questions: [place], [place] or Janett [email or web address]   Community Parkinson's Exercise Programs   Parkinson's Wellness Recovery Exercise Programs:   PWR! Moves PD Exercise Class:  This is a therapist-led exercise class for people with Parkinson's disease in the [place] community. It consists of a one-hour exercise class each week. Classes are offered in eight-week sessions, and the cost per session is $80. Class size is limited to a maximum of 20 participants. Participant criteria includes: Participant must be able to get up and down from the floor with minimal to no assistance, have had 0-1 falls in the past 6 months, and have completed physical or occupational therapy at [facility] within the past year.  To find out more about session dates, questions, or to register, please contact Mady Haagensen, Physical Therapist, or Nita Sells, Physical Brewing technologist, at [facility] at  [phone].  PWR! Circuit Class:  This is a therapist-led exercise class with intervals of circuit activities incorporating PWR! Moves into functional activities. It consists of one 45-minute exercise class per week. Classes are offered in eight-week sessions, and the cost per session is $120. Class size is limited to a maximum of eight participants to allow for hands-on instruction. Participant criteria: class is ideal for people with Parkinson's disease who have completed PWR! Moves Exercise Class or who are currently independently exercising and want to be challenged, must be able to walk independently with 0-1 falls in the past 6 months, able to get up and down from the floor independently, able to sit to stand independently, and able to jog 20 feet.   To find out more about session dates, questions, or to register, please contact Mady Haagensen, Physical Therapist, or Nita Sells, Physical Brewing technologist, at [facility] at [phone].   [employer] Parkinson's Cycle:   Parkinson's Cycle Class at [facility] This is an ongoing class on Monday and Thursday mornings at 10:45 a.m. A healthcare provider referral is required to enroll. This class is FREE to participants, and you do not have to be a member of the [employer] to enroll. Contact Beth at [phone] or [email or web address]. Parkinson's Cycle Class at [facility] Ongoing Class Monday, Wednesday, and Friday mornings at 9:00 a.m. A healthcare provider referral is required to enroll. This class is FREE to participants, and you do not have to be a member of the [employer] to enroll. Contact Marlee at [phone] or [email or web address]. Parkinson's Cycle Class at [facility] Ongoing Class every Friday mornings at 12 p.m.  A healthcare provider referral is required to enroll. This class is FREE to participants, and you do not have to be a member of the [employer] to enroll.  Contact [phone].  Parkinson's Cycle Class at [facility] Ongoing Class every Monday at 12pm.  A healthcare provider referral is required to enroll. This class is FREE to participants, and you do not have to be a member of the [employer] to enroll. Contact Almyra Free at [phone] or  [email or web address].   [employer]:  [employer]  Classes are offered Mondays at 5:15 p.m. and Tuesdays and Thursdays at 12 p.m. at [employer]. For more information, contact [phone] or visit [email or web address] or [email or web address]. [employer] [place] Classes are offered Monday, Wednesday, and Friday from 9:30 a.m. - 11:00 a.m. For more information, contact [phone] or [phone] or email [email or web address] or visit [email or web address] or [email or web address]. [employer] (classes are offered at 2 locations) . [employer] in [place] (for more information, contact [place] at [phone] or email [email or web address] . Cathren Laine at [facility] (class is open to the public -- for more information, contact Clabe Seal at [phone] or email [email or web address]) [place] are held at [facility] in [place], [name]. For more information, call Dr. Bing Plume at [phone] or [email or web address].   Personal Training for Parkinson's:   ACT Offers certified personal training to customize a program to meet your exercise needs to address Parkinson's disease. For more information, contact [phone] or visit [email or web address].  Community Dance for Parkinson's:   Community dance class for people with Parkinson's Disease Wednesdays at 9 a.m. [employer] [place], [place] [address] Please contact Eliberto Ivory [phone] for more information  Scholarships Available for Fitness Programs:  The [place] [employer] is a non-profit 501(C)3 organization run by volunteers, whose mission is to strive to empower those living with Parkinson's Disease (PD), Progressive Supra-Nuclear Palsy (PSP) and Multiple System Atrophy ([place]).  Through financial support, recipients benefit from individual and group programs. [phone] [email or web address]

## 2018-07-15 ENCOUNTER — Ambulatory Visit: Payer: Medicare Other | Admitting: Cardiovascular Disease

## 2018-07-15 ENCOUNTER — Encounter: Payer: Self-pay | Admitting: Cardiovascular Disease

## 2018-07-15 VITALS — BP 116/76 | HR 76 | Ht 77.0 in | Wt 238.0 lb

## 2018-07-15 DIAGNOSIS — I714 Abdominal aortic aneurysm, without rupture, unspecified: Secondary | ICD-10-CM

## 2018-07-15 DIAGNOSIS — E785 Hyperlipidemia, unspecified: Secondary | ICD-10-CM | POA: Diagnosis not present

## 2018-07-15 DIAGNOSIS — I10 Essential (primary) hypertension: Secondary | ICD-10-CM

## 2018-07-15 DIAGNOSIS — I482 Chronic atrial fibrillation, unspecified: Secondary | ICD-10-CM | POA: Diagnosis not present

## 2018-07-15 DIAGNOSIS — Z7901 Long term (current) use of anticoagulants: Secondary | ICD-10-CM | POA: Insufficient documentation

## 2018-07-15 DIAGNOSIS — I2510 Atherosclerotic heart disease of native coronary artery without angina pectoris: Secondary | ICD-10-CM

## 2018-07-15 MED ORDER — DILTIAZEM HCL ER BEADS 180 MG PO CP24: 180.0000 mg | ORAL_CAPSULE | Freq: Every day | ORAL | 3 refills | Status: DC

## 2018-07-15 NOTE — Patient Instructions (Signed)
Medication Instructions:  Dr Sallyanne Kuster has recommended making the following medication changes: 1. DECREASE Diltiazem to 180 mg daily  If you need a refill on your cardiac medications before your next appointment, please call your pharmacy.   Follow-Up: At [facility], you and your health needs are our priority.  As part of our continuing mission to provide you with exceptional heart care, we have created designated Provider Care Teams.  These Care Teams include your primary Cardiologist (physician) and Advanced Practice Providers (APPs -  Physician Assistants and Nurse Practitioners) who all work together to provide you with the care you need, when you need it. You will need a follow up appointment in 12 months.  Please call our office 2 months in advance to schedule this appointment.  You may see Sanda Klein, MD or one of the following Advanced Practice Providers on your designated Care Team: [place], [place] . Fabian Sharp, PA-C

## 2018-07-15 NOTE — Progress Notes (Signed)
Cardiology Office Note    Date:  07/15/2018   ID:  Adam Kane, DOB 06/04/1933, MRN [id number]  PCP:  Crist Infante, MD  Cardiologist:   Sanda Klein, MD    Chief Complaint  Patient presents with  .  Orthostatic dizziness    History of Present Illness:  Adam Kane is a 84 y.o. male with chronic atrial fibrillation (recurrent after multiple attempts at cardioversion in the past), hypertension and hyperlipidemia, coronary artery disease s/p CABG 2001 LIMA-LAD, SVG-PDA), and small abdominal aortic aneurysm.  Recently his biggest health challenge has been Parkinson's disease for which she is prescribed carbidopa levodopa.  He has had increasing problems with orthostatic hypotension.  His beta-blocker was discontinued.  Despite this he had issues with dizziness when changing position.  His blood pressure is fairly low.  The carbidopa levodopa has worked reasonably well for his Parkinson's symptoms.  He has not had any episodes of syncope or falls, injuries, bleeding problems, hematuria or focal neurological events.  He is now on digoxin 0.125 mg daily and diltiazem capsule 240 mg daily for rate control.  The patient specifically denies any chest pain at rest or exertion, dyspnea at rest or with exertion, orthopnea, paroxysmal nocturnal dyspnea, syncope, palpitations, focal neurological deficits, intermittent claudication, lower extremity edema, unexplained weight gain, cough, hemoptysis or wheezing.  He has frequent problems with nephrolithiasis and has previously undergone lithotripsy. He reports previous pulmonary function test that showed excellent results.  Past Medical History:  Diagnosis Date  . A-fib ([place])   . Abdominal aortic aneurysm (AAA), 30-34 mm diameter (HCC)   . Arthritis   . CAD (coronary artery disease)   . Cancer [facility])    prostate cancer 2009  . Chronic kidney disease    kidney stone  . Complication of anesthesia    if given anesthesia too quickly  experiences nausea and vomiting   . Diverticulosis of colon without hemorrhage   . Dyslipidemia   . Dysrhythmia   . Early-onset Parkinson's disease ([place])   . GERD (gastroesophageal reflux disease)   . Heart murmur    childhood  . History of kidney stones   . PONV (postoperative nausea and vomiting)   . S/P CABG (coronary artery bypass graft) 2001  . Shortness of breath    mild exertion due to afib  . Systemic hypertension   . Urinary leakage     Past Surgical History:  Procedure Laterality Date  . APPENDECTOMY    . CARDIAC CATHETERIZATION  02/20/94  . CATARACT EXTRACTION, BILATERAL    . CHOLECYSTECTOMY    . CORONARY ARTERY BYPASS GRAFT  2001   LIMA to LAD,SVG to PDA  . CORONARY ARTERY BYPASS GRAFT     2 vessel 2001  . CYSTOSCOPY WITH RETROGRADE PYELOGRAM, URETEROSCOPY AND STENT PLACEMENT Left 05/18/2014   Procedure: CYSTOSCOPY WITH RETROGRADE PYELOGRAM, URETEROSCOPY AND STENT PLACEMENT;  Surgeon: Raynelle Bring, MD;  [facility];  Service: Urology;  Laterality: Left;  . CYSTOSCOPY WITH RETROGRADE PYELOGRAM, URETEROSCOPY AND STENT PLACEMENT Right 08/21/2014   Procedure: CYSTOSCOPY WITH RETROGRADE PYELOGRAM, URETEROSCOPY AND STENT PLACEMENT WITH STONE EXTRACTION basket;  Surgeon: Malka So, MD;  [facility];  Service: Urology;  Laterality: Right;  . HERNIA REPAIR    . HOLMIUM LASER APPLICATION Left 12/11/8784   Procedure: HOLMIUM LASER APPLICATION;  Surgeon: Raynelle Bring, MD;  [facility];  Service: Urology;  Laterality: Left;  . LUMBAR LAMINECTOMY/DECOMPRESSION MICRODISCECTOMY Left 03/07/2013   Procedure: LUMBAR FOUR-FIVE EXTRAFORAMINAL LUMBAR  LAMINECTOMY/DECOMPRESSION MICRODISCECTOMY 1 LEVEL;  Surgeon: Charlie Pitter, MD;  Location: [place] [facility];  Service: Neurosurgery;  Laterality: Left;  Left lumbar four-five extraforaminal microdiskectomy  . LUMBAR LAMINECTOMY/DECOMPRESSION MICRODISCECTOMY Left 01/15/2018   Procedure: Microdiscectomy - left - Lumbar Four -Lumbar Five  extraforaminal;  Surgeon: Earnie Larsson, MD;  Location: [facility];  Service: Neurosurgery;  Laterality: Left;  Microdiscectomy - left - Lumbar Four -Lumbar Five extraforaminal  . PROSTATECTOMY    . TONSILLECTOMY      Current Medications: Outpatient Medications Prior to Visit  Medication Sig Dispense Refill  . aspirin EC 81 MG tablet Take 81 mg by mouth daily.    Marland Kitchen atorvastatin (LIPITOR) 40 MG tablet Take 40 mg by mouth at bedtime.     . carbidopa-levodopa (SINEMET IR) 25-100 MG tablet Take 1 tablet by mouth 3 (three) times daily. 90 tablet 4  . cholecalciferol (VITAMIN D) 1000 UNITS tablet Take 1,000 Units by mouth daily.    . digoxin (LANOXIN) 0.125 MG tablet Take 1 tablet (0.125 mg total) daily by mouth. 90 tablet 3  . ezetimibe (ZETIA) 10 MG tablet Take 10 mg by mouth every morning.     Marland Kitchen omeprazole (PRILOSEC) 20 MG capsule Take 20 mg by mouth daily.     . vitamin B-12 (CYANOCOBALAMIN) 1000 MCG tablet Take 1,000 mcg by mouth daily.    Marland Kitchen warfarin (COUMADIN) 2 MG tablet Take 2 mg by mouth daily at 6 PM. Take 1.5 tab Monday, Wednesday, and Friday. Take 1 tablet all other days.    Marland Kitchen diltiazem (TIAZAC) 240 MG 24 hr capsule Take 240 mg by mouth every morning.     . metoprolol tartrate (LOPRESSOR) 25 MG tablet Take 25 mg by mouth 2 (two) times daily.     No facility-administered medications prior to visit.      Allergies:   Dilaudid [hydromorphone hcl] and Morphine and related   Social History   Socioeconomic History  . Marital status: Married    Spouse name: Not on file  . Number of children: Not on file  . Years of education: Not on file  . Highest education level: Not on file  Occupational History  . Occupation: retired    Comment: Technical brewer  . Financial resource strain: Not on file  . Food insecurity:    Worry: Not on file    Inability: Not on file  . Transportation needs:    Medical: Not on file    Non-medical: Not on file  Tobacco Use  . Smoking status: Former  Smoker    Types: Cigarettes    Last attempt to quit: 09/01/1973    Years since quitting: 44.8  . Smokeless tobacco: Never Used  Substance and Sexual Activity  . Alcohol use: No  . Drug use: No  . Sexual activity: Not on file  Lifestyle  . Physical activity:    Days per week: Not on file    Minutes per session: Not on file  . Stress: Not on file  Relationships  . Social connections:    Talks on phone: Not on file    Gets together: Not on file    Attends religious service: Not on file    Active member of club or organization: Not on file    Attends meetings of clubs or organizations: Not on file    Relationship status: Not on file  Other Topics Concern  . Not on file  Social History Narrative  . Not on file  Family History:  The patient's family history includes AAA (abdominal aortic aneurysm) in his father; Breast cancer in his mother; Cancer in his mother; Throat cancer in his father.   ROS:   Please see the history of present illness.    ROS all other systems are reviewed and are negative   PHYSICAL EXAM:   VS:  BP 116/76   Pulse 76   Ht 6\' 5"  (1.956 m)   Wt 238 lb (108 kg)   BMI 28.22 kg/m      General: Alert, oriented x3, no distress, overweight but not obese Head: no evidence of trauma, PERRL, EOMI, no exophtalmos or lid lag, no myxedema, no xanthelasma; normal ears, nose and oropharynx Neck: normal jugular venous pulsations and no hepatojugular reflux; brisk carotid pulses without delay and no carotid bruits Chest: clear to auscultation, no signs of consolidation by percussion or palpation, normal fremitus, symmetrical and full respiratory excursions Cardiovascular: normal position and quality of the apical impulse, irregular rhythm, normal first and second heart sounds, no murmurs, rubs or gallops Abdomen: no tenderness or distention, no masses by palpation, no abnormal pulsatility or arterial bruits, normal bowel sounds, no hepatosplenomegaly Extremities: no  clubbing, cyanosis or edema; 2+ radial, ulnar and brachial pulses bilaterally; 2+ right femoral, posterior tibial and dorsalis pedis pulses; 2+ left femoral, posterior tibial and dorsalis pedis pulses; no subclavian or femoral bruits Neurological: grossly nonfocal Psych: Normal mood and affect   Wt Readings from Last 3 Encounters:  07/15/18 238 lb (108 kg)  03/09/18 232 lb (105.2 kg)  01/15/18 223 lb 12.8 oz (101.5 kg)      Studies/Labs Reviewed:   ECG was ordered and performed today shows: Atrial fibrillation with controlled ventricular response, nonspecific T wave changes that are likely digoxin related  LABS:  From [facility] October 2018  Cholesterol 130, triglycerides 101, HDL 31, LDL 79 Creatinine 1.2, hemoglobin 16, normal electrolytes and liver function tests.  TSH 2.13, hemoglobin A1c 6.2%  Labs from September 07, 2017 BUN 16, creatinine 1.1, potassium 3.8, hemoglobin 15.7, normal liver function tests ASSESSMENT:    1. Chronic a-fib   2. Essential hypertension   3. Dyslipidemia (high LDL; low HDL)   4. Coronary artery disease involving native coronary artery of native heart without angina pectoris   5. AAA (abdominal aortic aneurysm) without rupture ([place])   6. Long term current use of anticoagulant      PLAN:  In order of problems listed above:  1. AFib: Currently well rate controlled.  Poorly tolerant of beta-blockers.  Decrease diltiazem 280 mg daily due to complaints of orthostatic hypotension and continue digoxin..  As he gets older and Parkinson's disease worsens or his dose of Parkinson's medicines increases we may have to further curtail the diltiazem.  Try to keep his resting heart rate less than 90, activity heart rate less than 120.  CHADSVasc 3 (age 64, HTN).  2. HTN: Very well controlled.   3. HLP: On statin and Zetia combination.  Labs followed at [facility]. 4. CAD s/p CABG: No angina at rest or with activity. 5. AAA: Asymptomatic, distal  abdominal aortic aneurysm measuring 3.9 x 3.6 cm on ultrasound from May 2019 (was 3.4 on 2015 CT), with recommendation follow-up study in 2 years. 6. Warfarin: Monitored at [facility], no bleeding problems, no hematuria despite known nephrolithiasis issues, no hematochezia despite known colonic diverticulosis..   Medication Adjustments/Labs and Tests Ordered: Current medicines are reviewed at length with the patient today.  Concerns  regarding medicines are outlined above.  Medication changes, Labs and Tests ordered today are listed in the Patient Instructions below. Patient Instructions  Medication Instructions:  Dr Sallyanne Kuster has recommended making the following medication changes: 1. DECREASE Diltiazem to 180 mg daily  If you need a refill on your cardiac medications before your next appointment, please call your pharmacy.   Follow-Up: At [facility], you and your health needs are our priority.  As part of our continuing mission to provide you with exceptional heart care, we have created designated Provider Care Teams.  These Care Teams include your primary Cardiologist (physician) and Advanced Practice Providers (APPs -  Physician Assistants and Nurse Practitioners) who all work together to provide you with the care you need, when you need it. You will need a follow up appointment in 12 months.  Please call our office 2 months in advance to schedule this appointment.  You may see Sanda Klein, MD or one of the following Advanced Practice Providers on your designated Care Team: [place], [place] . Fabian Sharp, PA-C    Signed, Sanda Klein, MD  07/15/2018 6:17 PM    [address] [place], [place], [place]  [address] Phone: [phone]; Fax: [phone]

## 2018-08-12 ENCOUNTER — Ambulatory Visit: Payer: Medicare Other | Admitting: Neurology

## 2018-09-09 ENCOUNTER — Encounter (HOSPITAL_COMMUNITY): Payer: Medicare Other

## 2018-09-14 ENCOUNTER — Other Ambulatory Visit (HOSPITAL_COMMUNITY): Payer: Self-pay | Admitting: Internal Medicine

## 2018-09-14 DIAGNOSIS — I779 Disorder of arteries and arterioles, unspecified: Secondary | ICD-10-CM

## 2018-09-14 DIAGNOSIS — I739 Peripheral vascular disease, unspecified: Principal | ICD-10-CM

## 2018-09-16 ENCOUNTER — Ambulatory Visit (HOSPITAL_COMMUNITY)
Admission: RE | Admit: 2018-09-16 | Discharge: 2018-09-16 | Disposition: A | Discharge status: Home / Self Care | Payer: Medicare Other | Source: Ambulatory Visit | Attending: Vascular Surgery | Admitting: Vascular Surgery

## 2018-09-16 DIAGNOSIS — I739 Peripheral vascular disease, unspecified: Secondary | ICD-10-CM

## 2018-09-16 DIAGNOSIS — I779 Disorder of arteries and arterioles, unspecified: Secondary | ICD-10-CM

## 2018-12-30 ENCOUNTER — Ambulatory Visit: Payer: Medicare Other | Admitting: Neurology

## 2018-12-30 ENCOUNTER — Encounter

## 2019-01-04 ENCOUNTER — Other Ambulatory Visit: Payer: Self-pay | Admitting: Neurology

## 2019-02-28 ENCOUNTER — Other Ambulatory Visit: Payer: Self-pay | Admitting: Neurology

## 2019-02-28 NOTE — Telephone Encounter (Signed)
Requested Prescriptions   Pending Prescriptions Disp Refills  . carbidopa-levodopa (SINEMET IR) 25-100 MG tablet [Pharmacy Med Name: CARBIDOPA-LEVODOPA 25-100 TAB] 90 tablet 0    Sig: TAKE 1 TABLET BY MOUTH 3 TIMES A DAY.   Rx last filled:01/04/19 #90 0 REFILLS  Pt last seen:03/09/18  Follow up appt scheduled:04/21/19

## 2019-04-19 NOTE — Progress Notes (Signed)
Adam Kane was seen today in the movement disorders clinic for neurologic consultation at the request of Crist Infante, MD.  The consultation is for the evaluation of tremor and to r/o PD. This patient is accompanied in the office by his daughter who supplements the history.  03/09/18 update: Patient is seen today in follow-up for newly diagnosed Parkinson's disease.  He is accompanied by his daughter who supplements the history.  Parkinson's was diagnosed last November.  He had to cancel his follow-up, so today is the first day in which I have seen him back.  Medications were declined last visit.  Overall, the patient reports that he has been doing well.  Medical records are reviewed.  He had surgery with Dr. Annette Stable on Jan 15, 2018.  Microdiscectomy at the L4-L5 level was completed.  Recovered well.  No falls since surgery.  Had falls prior to surgery.  He calls it loss of consciousness but daughter thinks that it was just loss of balance.  Daughter does state that blood pressure was low and Dr. Joylene Draft has been lowering metoprolol because of this.  Much better since lowering metoprolol - now at 25 mg - 1/2 po bid.     Had vertigo early in the year (Jan, 2019).  Had hematuria from coumadin since last visit.  Hasn't been able to do much exercise since back surgery but has been released by neurosx to do exercise.  Asks about filling out form for [employer] cycle class.  Still having some back/buttocks pain.  04/21/19 update: Patient seen today in follow-up for Parkinson's disease.  This patient is accompanied in the office by his child who supplements the history.  I have not seen him in a year.  At that point in time, I started him on carbidopa/levodopa 25/100 and worked him up to 1 tablet 3 times per day.  The patient stated that he was dizzy and went to his primary care physician and was told to take carbidopa/levodopa 25/100, 0.5 tablets in the morning, 0.5 in the afternoon and 1 tablet in the evening.   Patient still complains of continued dizziness.  I have not unfortunately seen the patient since his levodopa was started a year ago.  He had a follow-up in December, but  he canceled his December follow-up and rescheduled April, at which point we were only doing video visits and he did not want to do that, so rescheduled until today.  Medical records are reviewed since our last visit, including his cardiology records from November, 2019.  I did note that cardiology decreased his diltiazem because of complaints of "orthostatic hypotension."  Cardiology also noted that they may need to continue to decrease things further in the future.  Pt complains today of continued dizziness.    PREVIOUS MEDICATIONS: carbidopa/levodopa 25/100  ALLERGIES:   Allergies  Allergen Reactions  . Dilaudid [Hydromorphone Hcl] Other (See Comments)    PASSES OUT  . Morphine And Related Other (See Comments)    PASSES OUT    CURRENT MEDICATIONS:  Outpatient Encounter Medications as of 04/21/2019  Medication Sig  . aspirin EC 81 MG tablet Take 81 mg by mouth daily.  Marland Kitchen atorvastatin (LIPITOR) 40 MG tablet Take 40 mg by mouth at bedtime.   . cholecalciferol (VITAMIN D) 1000 UNITS tablet Take 1,000 Units by mouth daily.  . digoxin (LANOXIN) 0.125 MG tablet Take 1 tablet (0.125 mg total) daily by mouth.  . diltiazem (TIAZAC) 180 MG 24 hr capsule Take 1  capsule (180 mg total) by mouth daily.  Marland Kitchen ezetimibe (ZETIA) 10 MG tablet Take 10 mg by mouth every morning.   Marland Kitchen omeprazole (PRILOSEC) 20 MG capsule Take 20 mg by mouth daily.   . vitamin B-12 (CYANOCOBALAMIN) 1000 MCG tablet Take 1,000 mcg by mouth daily.  Marland Kitchen warfarin (COUMADIN) 2 MG tablet Take 2 mg by mouth daily at 6 PM. Take 1.5 tab Monday, Wednesday, and Friday. Take 1 tablet all other days.  . [DISCONTINUED] carbidopa-levodopa (SINEMET IR) 25-100 MG tablet TAKE 1 TABLET BY MOUTH 3 TIMES A DAY.  . Carbidopa-Levodopa ER (SINEMET CR) 25-100 MG tablet controlled release Take  1 tablet by mouth 3 (three) times daily.   No facility-administered encounter medications on file as of 04/21/2019.     PAST MEDICAL HISTORY:   Past Medical History:  Diagnosis Date  . A-fib ([place])   . Abdominal aortic aneurysm (AAA), 30-34 mm diameter (HCC)   . Arthritis   . CAD (coronary artery disease)   . Cancer [facility])    prostate cancer 2009  . Chronic kidney disease    kidney stone  . Complication of anesthesia    if given anesthesia too quickly experiences nausea and vomiting   . Diverticulosis of colon without hemorrhage   . Dyslipidemia   . Dysrhythmia   . Early-onset Parkinson's disease ([place])   . GERD (gastroesophageal reflux disease)   . Heart murmur    childhood  . History of kidney stones   . PONV (postoperative nausea and vomiting)   . S/P CABG (coronary artery bypass graft) 2001  . Shortness of breath    mild exertion due to afib  . Systemic hypertension   . Urinary leakage     PAST SURGICAL HISTORY:   Past Surgical History:  Procedure Laterality Date  . APPENDECTOMY    . CARDIAC CATHETERIZATION  02/20/94  . CATARACT EXTRACTION, BILATERAL    . CHOLECYSTECTOMY    . CORONARY ARTERY BYPASS GRAFT  2001   LIMA to LAD,SVG to PDA  . CORONARY ARTERY BYPASS GRAFT     2 vessel 2001  . CYSTOSCOPY WITH RETROGRADE PYELOGRAM, URETEROSCOPY AND STENT PLACEMENT Left 05/18/2014   Procedure: CYSTOSCOPY WITH RETROGRADE PYELOGRAM, URETEROSCOPY AND STENT PLACEMENT;  Surgeon: Raynelle Bring, MD;  [facility];  Service: Urology;  Laterality: Left;  . CYSTOSCOPY WITH RETROGRADE PYELOGRAM, URETEROSCOPY AND STENT PLACEMENT Right 08/21/2014   Procedure: CYSTOSCOPY WITH RETROGRADE PYELOGRAM, URETEROSCOPY AND STENT PLACEMENT WITH STONE EXTRACTION basket;  Surgeon: Malka So, MD;  [facility];  Service: Urology;  Laterality: Right;  . HERNIA REPAIR    . HOLMIUM LASER APPLICATION Left 2/70/6237   Procedure: HOLMIUM LASER APPLICATION;  Surgeon: Raynelle Bring, MD;  [facility];  Service: Urology;  Laterality: Left;  . LUMBAR LAMINECTOMY/DECOMPRESSION MICRODISCECTOMY Left 03/07/2013   Procedure: LUMBAR FOUR-FIVE EXTRAFORAMINAL LUMBAR LAMINECTOMY/DECOMPRESSION MICRODISCECTOMY 1 LEVEL;  Surgeon: Charlie Pitter, MD;  Location: [place] [facility];  Service: Neurosurgery;  Laterality: Left;  Left lumbar four-five extraforaminal microdiskectomy  . LUMBAR LAMINECTOMY/DECOMPRESSION MICRODISCECTOMY Left 01/15/2018   Procedure: Microdiscectomy - left - Lumbar Four -Lumbar Five extraforaminal;  Surgeon: Earnie Larsson, MD;  Location: [place];  Service: Neurosurgery;  Laterality: Left;  Microdiscectomy - left - Lumbar Four -Lumbar Five extraforaminal  . PROSTATECTOMY    . TONSILLECTOMY      SOCIAL HISTORY:   Social History   Socioeconomic History  . Marital status: Married    Spouse name: Not on file  . Number  of children: 3  . Years of education: 20  . Highest education level: Not on file  Occupational History  . Occupation: retired    Comment: Technical brewer  . Financial resource strain: Not on file  . Food insecurity    Worry: Not on file    Inability: Not on file  . Transportation needs    Medical: Not on file    Non-medical: Not on file  Tobacco Use  . Smoking status: Former Smoker    Types: Cigarettes    Quit date: 09/01/1973    Years since quitting: 45.6  . Smokeless tobacco: Never Used  Substance and Sexual Activity  . Alcohol use: No  . Drug use: No  . Sexual activity: Not on file  Lifestyle  . Physical activity    Days per week: Not on file    Minutes per session: Not on file  . Stress: Not on file  Relationships  . Social Herbalist on phone: Not on file    Gets together: Not on file    Attends religious service: Not on file    Active member of club or organization: Not on file    Attends meetings of clubs or organizations: Not on file    Relationship status: Not on file  . Intimate partner violence    Fear of current or ex partner:  Not on file    Emotionally abused: Not on file    Physically abused: Not on file    Forced sexual activity: Not on file  Other Topics Concern  . Not on file  Social History Narrative   One story home   Right handed   Lives with wife    FAMILY HISTORY:   Family Status  Relation Name Status  . Mother  Deceased  . Father  Deceased  . Daughter  Alive  . Son x2 Alive    ROS:  Review of Systems  Constitutional: Negative.   HENT: Negative.   Eyes: Negative.   Respiratory: Negative.   Cardiovascular: Negative.   Genitourinary: Negative.   Skin: Negative.   Neurological: Positive for dizziness.    PHYSICAL EXAMINATION:    VITALS:   Vitals:   04/21/19 1333  BP: 128/78  Pulse: 94  SpO2: 95%  Weight: 233 lb (105.7 kg)  Height: 6\' 4"  (1.93 m)    GEN:  The patient appears stated age and is in NAD. HEENT:  Normocephalic, atraumatic.  The mucous membranes are moist. The superficial temporal arteries are without ropiness or tenderness. CV:  Irreg.  irreg Lungs:  CTAB Neck/HEME:  There are no carotid bruits bilaterally.  Neurological examination:  Orientation: The patient is alert and oriented x3. Cranial nerves: There is good facial symmetry. The speech is fluent and clear. Soft palate rises symmetrically and there is no tongue deviation. Hearing is intact to conversational tone. Sensation: Sensation is intact to light touch throughout Motor: Strength is 5/5 in the bilateral upper and lower extremities.   Shoulder shrug is equal and symmetric.  There is no pronator drift.  Movement examination: Tone: There is normal tone in the RUE.   There is mild increased tone in the LUE  Abnormal movements: There is mild chin tremor.  There is L>RUE rest tremor (same as previous) Coordination:  There is mild decremation with RAM's, with heel and toe taps bilaterally, L more than R.   Gait and Station: The patient pushes off of the chair to arise.  He is short stepped, but not really  shuffling.  He does use his cane.   Labs:   ASSESSMENT/PLAN:  1.  Idiopathic Parkinson's disease, diagnosed November, 2018  -stop carbidopa/levodopa 25/100 IR, as was unable to tolerate effective dosages due to dizziness  -start carbidopa/levodopa 25/100 CR tid.    -discussed neurorehab center for PT for PD.  He was agreeable.     2.  Low back pain, status post microdiscectomy in May, 2019.  -Patient is doing better and still following with neurosurgery.  3.  Dizziness, related to orthostasis.  -Cardiology has decreased his diltiazem, which he is on for A. fib.  They are continuing to monitor, knowing that he has Parkinson's.  -Asked patient/daughter to make a follow-up with cardiology, as he is continuing to have dizziness, although I did change his levodopa today.  4.  Follow up is anticipated in the next 4-6 months, sooner should new neurologic issues arise.  Much greater than 50% of this visit was spent in counseling and coordinating care.  Total face to face time:  25 min  Cc:  Crist Infante, MD

## 2019-04-21 ENCOUNTER — Encounter: Payer: Self-pay | Admitting: Neurology

## 2019-04-21 ENCOUNTER — Ambulatory Visit (INDEPENDENT_AMBULATORY_CARE_PROVIDER_SITE_OTHER): Payer: Medicare Other | Admitting: Neurology

## 2019-04-21 ENCOUNTER — Other Ambulatory Visit: Payer: Self-pay

## 2019-04-21 VITALS — BP 128/78 | HR 94 | Ht 76.0 in | Wt 233.0 lb

## 2019-04-21 DIAGNOSIS — I4891 Unspecified atrial fibrillation: Secondary | ICD-10-CM | POA: Diagnosis not present

## 2019-04-21 DIAGNOSIS — R42 Dizziness and giddiness: Secondary | ICD-10-CM

## 2019-04-21 DIAGNOSIS — G20 Parkinson's disease: Secondary | ICD-10-CM | POA: Diagnosis not present

## 2019-04-21 MED ORDER — CARBIDOPA-LEVODOPA ER 25-100 MG PO TBCR: 1.0000 | EXTENDED_RELEASE_TABLET | Freq: Three times a day (TID) | ORAL | 1 refills | Status: DC

## 2019-04-21 NOTE — Patient Instructions (Addendum)
1.  Stop the carbidopa/levodopa 25/100 IR (the yellow pill you have) 2.  Start carbidopa/levodopa 25/100 CR, 1 tablet at 9am/1pm/5pm.  Keep it away from protein by 30 minutes but you can take it with carbohydrate 3.  You have been referred to Neuro Rehab for therapy. They will call you directly to schedule an appointment.  Please contact them at [phone] with any questions or delay in scheduling your appointment.

## 2019-04-22 ENCOUNTER — Telehealth: Payer: Self-pay | Admitting: Neurology

## 2019-04-22 NOTE — Telephone Encounter (Signed)
Patient daughter states that carbidopa levodopa 25/100 CR was to be called into the [facility] yesterday and they say that they dont have anything on file please call it in and let the daughter know

## 2019-04-22 NOTE — Telephone Encounter (Signed)
Outpatient Medication Detail   Disp Refills Start End   Carbidopa-Levodopa ER (SINEMET CR) 25-100 MG tablet controlled release 270 tablet 1 04/21/2019    Sig - Route: Take 1 tablet by mouth 3 (three) times daily. - Oral   Sent to pharmacy as: Carbidopa-Levodopa ER (SINEMET CR) 25-100 MG tablet controlled release   E-Prescribing Status: Receipt confirmed by pharmacy (04/21/2019 2:02 PM EDT)

## 2019-04-22 NOTE — Telephone Encounter (Signed)
Rx was called into pharmacy yesterday Pharmacy sent hard fax with clarification about patient prior dose of Carbidopa-Levodopa IR TID requesting clarification of change dose. Fax was sent back to fill CR. Pharmacy should be fill CR

## 2019-05-23 ENCOUNTER — Other Ambulatory Visit: Payer: Self-pay

## 2019-05-23 ENCOUNTER — Ambulatory Visit: Payer: Medicare Other | Attending: Neurology | Admitting: Physical Therapy

## 2019-05-23 DIAGNOSIS — R29818 Other symptoms and signs involving the nervous system: Secondary | ICD-10-CM | POA: Diagnosis present

## 2019-05-23 DIAGNOSIS — R2681 Unsteadiness on feet: Secondary | ICD-10-CM | POA: Insufficient documentation

## 2019-05-23 DIAGNOSIS — M6281 Muscle weakness (generalized): Secondary | ICD-10-CM | POA: Insufficient documentation

## 2019-05-23 DIAGNOSIS — R2689 Other abnormalities of gait and mobility: Secondary | ICD-10-CM | POA: Insufficient documentation

## 2019-05-24 NOTE — Therapy (Signed)
[facility] [facility] [address] [place], [name], [address] Phone: [phone]   Fax:  [phone]  Physical Therapy Evaluation  Patient Details  Name: Adam Kane MRN: [id number] Date of Birth: 04-Sep-1932 Referring Provider (PT): Tat, Wells Guiles   Encounter Date: 05/23/2019  PT End of Session - 05/24/19 1438    Visit Number  1    Number of Visits  17    Date for PT Re-Evaluation  08/20/19    Authorization Type  UHC Medicare    PT Start Time  [id number]    PT Stop Time  1405    PT Time Calculation (min)  47 min    Activity Tolerance  Patient tolerated treatment well    Behavior During Therapy  [facility] for tasks assessed/performed       Past Medical History:  Diagnosis Date  . A-fib ([place])   . Abdominal aortic aneurysm (AAA), 30-34 mm diameter (HCC)   . Arthritis   . CAD (coronary artery disease)   . Cancer [facility])    prostate cancer 2009  . Chronic kidney disease    kidney stone  . Complication of anesthesia    if given anesthesia too quickly experiences nausea and vomiting   . Diverticulosis of colon without hemorrhage   . Dyslipidemia   . Dysrhythmia   . Early-onset Parkinson's disease ([place])   . GERD (gastroesophageal reflux disease)   . Heart murmur    childhood  . History of kidney stones   . PONV (postoperative nausea and vomiting)   . S/P CABG (coronary artery bypass graft) 2001  . Shortness of breath    mild exertion due to afib  . Systemic hypertension   . Urinary leakage     Past Surgical History:  Procedure Laterality Date  . APPENDECTOMY    . CARDIAC CATHETERIZATION  02/20/94  . CATARACT EXTRACTION, BILATERAL    . CHOLECYSTECTOMY    . CORONARY ARTERY BYPASS GRAFT  2001   LIMA to LAD,SVG to PDA  . CORONARY ARTERY BYPASS GRAFT     2 vessel 2001  . CYSTOSCOPY WITH RETROGRADE PYELOGRAM, URETEROSCOPY AND STENT PLACEMENT Left 05/18/2014   Procedure: CYSTOSCOPY WITH RETROGRADE PYELOGRAM, URETEROSCOPY AND  STENT PLACEMENT;  Surgeon: Raynelle Bring, MD;  [facility];  Service: Urology;  Laterality: Left;  . CYSTOSCOPY WITH RETROGRADE PYELOGRAM, URETEROSCOPY AND STENT PLACEMENT Right 08/21/2014   Procedure: CYSTOSCOPY WITH RETROGRADE PYELOGRAM, URETEROSCOPY AND STENT PLACEMENT WITH STONE EXTRACTION basket;  Surgeon: Malka So, MD;  [facility];  Service: Urology;  Laterality: Right;  . HERNIA REPAIR    . HOLMIUM LASER APPLICATION Left [id number]   Procedure: HOLMIUM LASER APPLICATION;  Surgeon: Raynelle Bring, MD;  [facility];  Service: Urology;  Laterality: Left;  . LUMBAR LAMINECTOMY/DECOMPRESSION MICRODISCECTOMY Left 03/07/2013   Procedure: LUMBAR FOUR-FIVE EXTRAFORAMINAL LUMBAR LAMINECTOMY/DECOMPRESSION MICRODISCECTOMY 1 LEVEL;  Surgeon: Charlie Pitter, MD;  Location: [place] [facility];  Service: Neurosurgery;  Laterality: Left;  Left lumbar four-five extraforaminal microdiskectomy  . LUMBAR LAMINECTOMY/DECOMPRESSION MICRODISCECTOMY Left 01/15/2018   Procedure: Microdiscectomy - left - Lumbar Four -Lumbar Five extraforaminal;  Surgeon: Earnie Larsson, MD;  Location: [place];  Service: Neurosurgery;  Laterality: Left;  Microdiscectomy - left - Lumbar Four -Lumbar Five extraforaminal  . PROSTATECTOMY    . TONSILLECTOMY      There were no vitals filed for this visit.   Subjective Assessment - 05/23/19 1323    Subjective  Get a little dizzy with the Parkinson's  medication, and sometimes get a headache with the middle PD medication.  Daughter present and staes that getting up is harder and not lifting up feet like I should.  The dizziness is more unsteadiness.  Used the cane for about a year.    Patient is accompained by:  Family member   Daughter, [name]   Patient Stated Goals  Getting up and down better, strengthening, and walking better.    Currently in Pain?  Yes    Pain Score  7    sometimes at night   Pain Location  Back    Pain Orientation  Mid;Lower    Pain Descriptors / Indicators   Aching    Pain Type  Chronic pain         [facility] PT Assessment - 05/23/19 1330      Assessment   Medical Diagnosis  Parkinson's disease    Referring Provider (PT)  Tat, Wells Guiles    Onset Date/Surgical Date  04/21/19   MD visit   Hand Dominance  Right      Precautions   Precautions  Fall      Balance Screen   Has the patient fallen in the past 6 months  No    Has the patient had a decrease in activity level because of a fear of falling?   No    Is the patient reluctant to leave their home because of a fear of falling?   No      Home Social worker  Private residence    Living Arrangements  Spouse/significant other    Available Help at Discharge  Family    Type of [place] Access  Level entry    [place] - single point;Walker - 4 wheels      Prior Function   Level of Independence  Independent    Leisure  Has played golf in the distant past; did work part-time for [employer] prior to [employer].      Observation/Other Assessments   Focus on Therapeutic Outcomes (FOTO)   NA      ROM / Strength   AROM / PROM / Strength  Strength      Strength   Overall Strength  Within functional limits for tasks performed   per MMT   Overall Strength Comments  Decreased functional strength noted with needing UE support for sit to stand      Transfers   Transfers  Sit to Stand;Stand to Sit    Sit to Stand  6: Modified independent (Device/Increase time);With upper extremity assist;With armrests;From chair/3-in-1    Five time sit to stand comments   33.71    Stand to Sit  6: Modified independent (Device/Increase time);With upper extremity assist;To chair/3-in-1      Ambulation/Gait   Ambulation/Gait  Yes    Ambulation/Gait Assistance  6: Modified independent (Device/Increase time)    Ambulation Distance (Feet)  150 Feet    Assistive device  None    Gait Pattern  Step-through pattern;Decreased arm swing - left;Decreased  trunk rotation;Narrow base of support;Trunk flexed    Ambulation Surface  Level;Indoor    Gait velocity  17.94 sec = 1.83 ft/sec      Standardized Balance Assessment   Standardized Balance Assessment  Timed Up and Go Test;Dynamic Gait Index      Dynamic Gait Index   Level Surface  Mild Impairment    Change  in Gait Speed  Mild Impairment    Gait with Horizontal Head Turns  Mild Impairment    Gait with Vertical Head Turns  Mild Impairment    Gait and Pivot Turn  Normal    Step Over Obstacle  Moderate Impairment    Step Around Obstacles  Mild Impairment    Steps  Moderate Impairment    Total Score  15    DGI comment:  Scores <19/24 indicate increased fall risk.      Timed Up and Go Test   Normal TUG (seconds)  25.87    Cognitive TUG (seconds)  24.4    TUG Comments  Scores >13.5-15 sec indicate increased fall risk                Objective measurements completed on examination: See above findings.                PT Short Term Goals - 05/24/19 1456      PT SHORT TERM GOAL #1   Title  Pt will perform HEP independently to address Parkinson's specific deficits.  TARGET 06/24/2019    Time  4    Period  Weeks    Status  New    Target Date  06/24/19      PT SHORT TERM GOAL #2   Title  Pt will improve 5x sit<>stand to less than or equal to 25 seconds for improved functional lower extremity strength and transfer effiiency.    Time  4    Period  Weeks    Status  New    Target Date  06/24/19      PT SHORT TERM GOAL #3   Title  Pt will improve TUG score to less than or equal to 20 seconds for decreased fall risk.    Time  4    Period  Weeks    Status  New    Target Date  06/24/19      PT SHORT TERM GOAL #4   Title  Pt will improve gait velocity to at least 2 ft/sec for improved gait efficiency and safety.    Time  4    Period  Weeks    Status  New    Target Date  06/24/19      PT SHORT TERM GOAL #5   Title  Pt will verbalize understanding of fall  prevention in home environment.    Time  8    Period  Weeks    Status  New    Target Date  06/24/19        PT Long Term Goals - 05/24/19 1638      PT LONG TERM GOAL #1   Title  Pt will be independent with progression of HEP and transition to optimal ongoing fitness program upon d/c from PT.  TARGET 07/22/2019    Time  8    Period  Weeks    Status  New    Target Date  07/22/19      PT LONG TERM GOAL #2   Title  Pt will improve 5x sit<>stand score to less than or equal to 20 seconds for improved functional lower extremity strength and improved transfer efficiency.    Time  8    Period  Weeks    Target Date  07/22/19      PT LONG TERM GOAL #3   Title  Pt will improve TUG score to less than or equal to 15 seconds for decreased fall  risk.    Time  8    Period  Weeks    Status  New    Target Date  07/22/19      PT LONG TERM GOAL #4   Title  Pt will improve DGI score to at least 19/24 for decreased fall risk.    Time  8    Period  Weeks    Status  New    Target Date  07/22/19      PT LONG TERM GOAL #5   Title  Pt will improve gait velocity to at least 2.3 ft/sec for improved gait efficiency and safety.    Time  8    Period  Weeks    Status  New    Target Date  07/22/19             Plan - 05/24/19 1439    Clinical Impression Statement  Pt is an 85 year old male who presents to [place] with history of Parkinson's disease with more noticeable changes in walking and balance as well as strength.  Pt presents to O[facility]T with decreased functional strength, decreased balance, decreased timing and coordination of gait,  He has not had any falls, but he is at high risk of falls per TUG, DGI and gait velocity scores.  He would benefit from skilled PT to address the above stated deficits for improved overall functional mobility and decreased fall risk.    Personal Factors and Comorbidities  Comorbidity 3+    Comorbidities  microdiscectomy L4-5 12/2017, vertigo, low BP, A-fib, AAA, OA,  CABG 2050mild increased tone LUE    Examination-Activity Limitations  Locomotion Level;Transfers    Examination-Participation Restrictions  Community Activity    Stability/Clinical Decision Making  Evolving/Moderate complexity    Clinical Decision Making  Moderate    Rehab Potential  Good    PT Frequency  2x / week    PT Duration  8 weeks   plus eval   PT Treatment/Interventions  ADLs/Self Care Home Management;Gait training;Stair training;Functional mobility training;Therapeutic activities;Therapeutic exercise;Balance training;Neuromuscular re-education;Patient/family education    PT Next Visit Plan  Assess dizziness further (orthostatic hypotension versus BPPV versus decr vestibular system); sit<>stand transfer training; [employer]WR! Moves for intensity and amplitude    Consulted and Agree with Plan of Care  Patient;Family member/caregiver    Family Member Consulted  Daughter, [name]       Patient will benefit from skilled therapeutic intervention in order to improve the following deficits and impairments:  Abnormal gait, Difficulty walking, Decreased balance, Decreased mobility, Decreased strength  Visit Diagnosis: Other abnormalities of gait and mobility - Plan: PT plan of care cert/re-cert  Unsteadiness on feet - Plan: PT plan of care cert/re-cert  Muscle weakness (generalized) - Plan: PT plan of care cert/re-cert  Other symptoms and signs involving the nervous system - Plan: PT plan of care cert/re-cert     Problem List Patient Active Problem List   Diagnosis Date Noted  . Dyslipidemia (high LDL; low HDL) 07/15/2018  . Long term current use of anticoagulant 07/15/2018  . Lumbar disc herniation 01/15/2018  . Parkinson's disease ([place]) 08/01/2017  . Coronary artery disease involving native coronary artery of native heart without angina pectoris 07/10/2017  . Acute renal insufficiency 08/22/2014  . AAA (abdominal aortic aneurysm) without rupture ([place]) 08/22/2014  . Diverticulosis of  colon without hemorrhage 08/22/2014  . Right ureteral stone 08/21/2014  . Hyperlipidemia 05/31/2013  . Essential hypertension 03/03/2013  . Leukocytosis, unspecified 03/03/2013  . 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 Chronic a-fib 03/02/2013    Elyon Zoll W. 05/24/2019, 4:46 PM  Frazier Butt., PT   [facility] [address] [place], [name], [address] Phone: [phone]   Fax:  [phone]  Name: Adam Kane MRN: [id number] Date of Birth: [birth year]

## 2019-06-03 ENCOUNTER — Other Ambulatory Visit: Payer: Self-pay

## 2019-06-03 ENCOUNTER — Ambulatory Visit: Payer: Medicare Other | Attending: Neurology | Admitting: Physical Therapy

## 2019-06-03 VITALS — BP 118/81 | HR 96

## 2019-06-03 DIAGNOSIS — R2689 Other abnormalities of gait and mobility: Secondary | ICD-10-CM | POA: Insufficient documentation

## 2019-06-03 DIAGNOSIS — R2681 Unsteadiness on feet: Secondary | ICD-10-CM | POA: Diagnosis not present

## 2019-06-03 DIAGNOSIS — R29818 Other symptoms and signs involving the nervous system: Secondary | ICD-10-CM | POA: Diagnosis present

## 2019-06-03 DIAGNOSIS — M6281 Muscle weakness (generalized): Secondary | ICD-10-CM | POA: Insufficient documentation

## 2019-06-03 NOTE — Patient Instructions (Addendum)
    Provided patient/daughter with PWR! Moves YouTube video information for additional reference on how to do exercises.

## 2019-06-06 ENCOUNTER — Encounter: Payer: Self-pay | Admitting: Physical Therapy

## 2019-06-06 NOTE — Therapy (Signed)
[facility] [address] [place], [name], [address] Phone: [phone]   Fax:  [phone]  Physical Therapy Treatment  Patient Details  Name: Adam Kane MRN: [id number] Date of Birth: [birth year] Referring Provider (PT): Tat, Wells Guiles   Encounter Date: 06/03/2019  PT End of Session - 06/06/19 1334    Visit Number  2    Number of Visits  17    Date for PT Re-Evaluation  08/20/19    Authorization Type  UHC Medicare    PT Start Time  1101    PT Stop Time  1148    PT Time Calculation (min)  47 min    Activity Tolerance  Patient tolerated treatment well    Behavior During Therapy  [facility] for tasks assessed/performed       Past Medical History:  Diagnosis Date  . A-fib ([place])   . Abdominal aortic aneurysm (AAA), 30-34 mm diameter (HCC)   . Arthritis   . CAD (coronary artery disease)   . Cancer [facility])    prostate cancer 2009  . Chronic kidney disease    kidney stone  . Complication of anesthesia    if given anesthesia too quickly experiences nausea and vomiting   . Diverticulosis of colon without hemorrhage   . Dyslipidemia   . Dysrhythmia   . Early-onset Parkinson's disease ([place])   . GERD (gastroesophageal reflux disease)   . Heart murmur    childhood  . History of kidney stones   . PONV (postoperative nausea and vomiting)   . S/P CABG (coronary artery bypass graft) 2001  . Shortness of breath    mild exertion due to afib  . Systemic hypertension   . Urinary leakage     Past Surgical History:  Procedure Laterality Date  . APPENDECTOMY    . CARDIAC CATHETERIZATION  02/20/94  . CATARACT EXTRACTION, BILATERAL    . CHOLECYSTECTOMY    . CORONARY ARTERY BYPASS GRAFT  2001   LIMA to LAD,SVG to PDA  . CORONARY ARTERY BYPASS GRAFT     2 vessel 2001  . CYSTOSCOPY WITH RETROGRADE PYELOGRAM, URETEROSCOPY AND STENT PLACEMENT Left 05/18/2014   Procedure: CYSTOSCOPY WITH RETROGRADE PYELOGRAM, URETEROSCOPY AND  STENT PLACEMENT;  Surgeon: Raynelle Bring, MD;  [facility];  Service: Urology;  Laterality: Left;  . CYSTOSCOPY WITH RETROGRADE PYELOGRAM, URETEROSCOPY AND STENT PLACEMENT Right 08/21/2014   Procedure: CYSTOSCOPY WITH RETROGRADE PYELOGRAM, URETEROSCOPY AND STENT PLACEMENT WITH STONE EXTRACTION basket;  Surgeon: Malka So, MD;  [facility];  Service: Urology;  Laterality: Right;  . HERNIA REPAIR    . HOLMIUM LASER APPLICATION Left [id number]   Procedure: HOLMIUM LASER APPLICATION;  Surgeon: Raynelle Bring, MD;  [facility];  Service: Urology;  Laterality: Left;  . LUMBAR LAMINECTOMY/DECOMPRESSION MICRODISCECTOMY Left 03/07/2013   Procedure: LUMBAR FOUR-FIVE EXTRAFORAMINAL LUMBAR LAMINECTOMY/DECOMPRESSION MICRODISCECTOMY 1 LEVEL;  Surgeon: Charlie Pitter, MD;  Location: [place] [facility];  Service: Neurosurgery;  Laterality: Left;  Left lumbar four-five extraforaminal microdiskectomy  . LUMBAR LAMINECTOMY/DECOMPRESSION MICRODISCECTOMY Left 01/15/2018   Procedure: Microdiscectomy - left - Lumbar Four -Lumbar Five extraforaminal;  Surgeon: Earnie Larsson, MD;  Location: [place];  Service: Neurosurgery;  Laterality: Left;  Microdiscectomy - left - Lumbar Four -Lumbar Five extraforaminal  . PROSTATECTOMY    . TONSILLECTOMY      Vitals:   06/03/19 1104 06/03/19 1109  BP: 114/78 118/81  Pulse: 98 96    Subjective Assessment - 06/06/19 1325    Subjective  No changes, no falls.    Patient is accompained by:  Family member   Daughter, [name]   Patient Stated Goals  Getting up and down better, strengthening, and walking better.    Currently in Pain?  Yes    Pain Score  6     Pain Location  Back    Pain Orientation  Mid;Lower    Pain Descriptors / Indicators  Aching;Tightness    Pain Type  Chronic pain    Pain Onset  More than a month ago    Pain Frequency  Intermittent    Aggravating Factors   unsure    Pain Relieving Factors  unsure                Vitals: Sitting:  BP 114/78, HR  98 bpm Standing:  BP 118/81; HR 96 bpm       [facility] Adult PT Treatment/Exercise - 06/06/19 0001      Transfers   Transfers  Sit to Stand;Stand to Sit    Sit to Stand  6: Modified independent (Device/Increase time);With upper extremity assist;With armrests;From chair/3-in-1;From bed    Stand to Sit  6: Modified independent (Device/Increase time);With upper extremity assist;To chair/3-in-1;To bed    Number of Reps  --   At least 5 reps throughout session   Transfer Cueing  Cues for slowed descent into sitting      Ambulation/Gait   Ambulation/Gait  Yes    Ambulation/Gait Assistance  5: Supervision    Ambulation Distance (Feet)  230 Feet   additional 100 ft   Assistive device  Straight cane    Gait Pattern  Step-through pattern;Decreased arm swing - left;Decreased trunk rotation;Narrow base of support;Trunk flexed;Decreased step length - right;Decreased step length - left;Decreased dorsiflexion - right;Decreased dorsiflexion - left   Forward head posture looking at ground   Ambulation Surface  Level;Indoor    Pre-Gait Activities  At outside of parallel bars:  forward step and weight shift x 10 reps each side (with focus on foot clearance, heelstrike), then forward<>back step and weightshift x 10 reps each leg, with focus on foot clearance, step length.    Gait Comments  Gait with cane at end of session with cues for increased step length and heelstrike, noting improved foot clearance and step length overall,with supervision.      High Level Balance   High Level Balance Comments  Standing EO and EC on solid surface x 30 seconds (increased sway with EC); standing on foam EO x 30 seconds and unable to stand EC on foam (indicates possible decreased vestibular system use for balance).  Explained role of vestibular system, vision, somatosensory systems for balance.        With pre gait and gait activities, emphasis is on increased amplitude of movement, appropriate step length for improved  balance with gait.      PT Education - 06/06/19 1334    Education Details  Initiated HEP for [employer]! Moves in sitting; PWR! Moves YouTube link information for additional reference    Person(s) Educated  Patient;Child(ren)    Methods  Explanation;Demonstration;Tactile cues;Verbal cues;Handout    Comprehension  Verbalized understanding;Returned demonstration;Verbal cues required;Tactile cues required;Need further instruction       PT Short Term Goals - 05/24/19 1456      PT SHORT TERM GOAL #1   Title  Pt will perform HEP independently to address Parkinson's specific deficits.  TARGET 06/24/2019    Time  4    Period  [place]  Status  New    Target Date  06/24/19      PT SHORT TERM GOAL #2   Title  Pt will improve 5x sit<>stand to less than or equal to 25 seconds for improved functional lower extremity strength and transfer effiiency.    Time  4    Period  Weeks    Status  New    Target Date  06/24/19      PT SHORT TERM GOAL #3   Title  Pt will improve TUG score to less than or equal to 20 seconds for decreased fall risk.    Time  4    Period  Weeks    Status  New    Target Date  06/24/19      PT SHORT TERM GOAL #4   Title  Pt will improve gait velocity to at least 2 ft/sec for improved gait efficiency and safety.    Time  4    Period  Weeks    Status  New    Target Date  06/24/19      PT SHORT TERM GOAL #5   Title  Pt will verbalize understanding of fall prevention in home environment.    Time  8    Period  Weeks    Status  New    Target Date  06/24/19        PT Long Term Goals - 05/24/19 1638      PT LONG TERM GOAL #1   Title  Pt will be independent with progression of HEP and transition to optimal ongoing fitness program upon d/c from PT.  TARGET 07/22/2019    Time  8    Period  Weeks    Status  New    Target Date  07/22/19      PT LONG TERM GOAL #2   Title  Pt will improve 5x sit<>stand score to less than or equal to 20 seconds for improved functional  lower extremity strength and improved transfer efficiency.    Time  8    Period  Weeks    Target Date  07/22/19      PT LONG TERM GOAL #3   Title  Pt will improve TUG score to less than or equal to 15 seconds for decreased fall risk.    Time  8    Period  Weeks    Status  New    Target Date  07/22/19      PT LONG TERM GOAL #4   Title  Pt will improve DGI score to at least 19/24 for decreased fall risk.    Time  8    Period  Weeks    Status  New    Target Date  07/22/19      PT LONG TERM GOAL #5   Title  Pt will improve gait velocity to at least 2.3 ft/sec for improved gait efficiency and safety.    Time  8    Period  Weeks    Status  New    Target Date  07/22/19            Plan - 06/06/19 1335    Clinical Impression Statement  To further look at pt's c/o dizziness:  Assessed seated and standing blood pressures today, with essentially no change in sitting>stand blood pressure.  Assessed balance on solid and foam surfaces with EO and EC-pt with increased difficulty with EC on foam, indicating possible decreased vestibular system use for balance.  Initiated HEP for [employer]! Moves in sitting, with pt tolerating well.  With additional standing work, at end of session, pt is able to demonstrate improved foot clearance and step length with gait.  Pt will continue to benefit from skilled PT to address balance, posture, funcitonal strength and gait for improved overall mobility.    Personal Factors and Comorbidities  Comorbidity 3+    Comorbidities  microdiscectomy L4-5 12/2017, vertigo, low BP, A-fib, AAA, OA, CABG 2016mild increased tone LUE    Examination-Activity Limitations  Locomotion Level;Transfers    Examination-Participation Restrictions  Community Activity    Stability/Clinical Decision Making  Evolving/Moderate complexity    Rehab Potential  Good    PT Frequency  2x / week    PT Duration  8 weeks   plus eval   PT Treatment/Interventions  ADLs/Self Care Home Management;Gait  training;Stair training;Functional mobility training;Therapeutic activities;Therapeutic exercise;Balance training;Neuromuscular re-education;Patient/family education    PT Next Visit Plan  Review seated PWR! Moves; work on sit<>stand transfer training, standing PWR! Moves; also work on intensity and amplitude    Consulted and Agree with Plan of Care  Patient;Family member/caregiver    Family Member Consulted  Daughter, [name]       Patient will benefit from skilled therapeutic intervention in order to improve the following deficits and impairments:  Abnormal gait, Difficulty walking, Decreased balance, Decreased mobility, Decreased strength  Visit Diagnosis: Unsteadiness on feet  Muscle weakness (generalized)  Other abnormalities of gait and mobility     Problem List Patient Active Problem List   Diagnosis Date Noted  . Dyslipidemia (high LDL; low HDL) 07/15/2018  . Long term current use of anticoagulant 07/15/2018  . Lumbar disc herniation 01/15/2018  . Parkinson's disease ([place]) 08/01/2017  . Coronary artery disease involving native coronary artery of native heart without angina pectoris 07/10/2017  . Acute renal insufficiency 08/22/2014  . AAA (abdominal aortic aneurysm) without rupture ([place]) 08/22/2014  . Diverticulosis of colon without hemorrhage 08/22/2014  . Right ureteral stone 08/21/2014  . Hyperlipidemia 05/31/2013  . Essential hypertension 03/03/2013  . Leukocytosis, unspecified 03/03/2013  . 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 Chronic a-fib ([place]) 03/02/2013    Laytoya Ion W. 06/06/2019, 1:41 PM  Frazier Butt., PT   [facility] [address] [place], [name], [address] Phone: [phone]   Fax:  [phone]  Name: Adam Kane MRN: [id number] Date of Birth: 07-01-1933

## 2019-06-08 ENCOUNTER — Ambulatory Visit: Payer: Medicare Other | Admitting: Physical Therapy

## 2019-06-08 ENCOUNTER — Other Ambulatory Visit: Payer: Self-pay

## 2019-06-08 DIAGNOSIS — R2689 Other abnormalities of gait and mobility: Secondary | ICD-10-CM

## 2019-06-08 DIAGNOSIS — R2681 Unsteadiness on feet: Secondary | ICD-10-CM

## 2019-06-08 DIAGNOSIS — M6281 Muscle weakness (generalized): Secondary | ICD-10-CM

## 2019-06-08 NOTE — Patient Instructions (Addendum)
Sit to Stand Transfers:  1. Scoot out to the edge of the chair 2. Place your feet flat on the floor, shoulder width apart.  Make sure your feet are tucked just under your knees. 3. Lean forward (nose over toes) with momentum, and stand up tall with your best posture.  If you need to use your arms, use them as a quick boost up to stand. 4. If you are in a low or soft chair, you can lean back and then forward up to stand, in order to get more momentum. 5. Once you are standing, make sure you are looking ahead and standing tall.  To sit down:  1. Back up until you feel the chair behind your legs. 2. Bend at your hips, reaching  Back for you chair, if needed, then slowly squat to sit down on your chair.  Repeat 5-10 times, 1-2 times per day.

## 2019-06-09 NOTE — Therapy (Signed)
[facility] [address] [place], [name], [address] Phone: [phone]   Fax:  [phone]  Physical Therapy Treatment  Patient Details  Name: Adam Kane MRN: [id number] Date of Birth: 12-26-1932 Referring Provider (PT): Tat, Wells Guiles   Encounter Date: 06/08/2019  PT End of Session - 06/09/19 1416    Visit Number  3    Number of Visits  17    Date for PT Re-Evaluation  08/20/19    Authorization Type  [employer]HC Medicare    PT Start Time  1708    PT Stop Time  1752    PT Time Calculation (min)  44 min    Activity Tolerance  Patient tolerated treatment well    Behavior During Therapy  [facility] for tasks assessed/performed       Past Medical History:  Diagnosis Date  . A-fib ([place])   . Abdominal aortic aneurysm (AAA), 30-34 mm diameter (HCC)   . Arthritis   . CAD (coronary artery disease)   . Cancer [facility])    prostate cancer 2009  . Chronic kidney disease    kidney stone  . Complication of anesthesia    if given anesthesia too quickly experiences nausea and vomiting   . Diverticulosis of colon without hemorrhage   . Dyslipidemia   . Dysrhythmia   . Early-onset Parkinson's disease ([place])   . GERD (gastroesophageal reflux disease)   . Heart murmur    childhood  . History of kidney stones   . PONV (postoperative nausea and vomiting)   . S/P CABG (coronary artery bypass graft) 2001  . Shortness of breath    mild exertion due to afib  . Systemic hypertension   . Urinary leakage     Past Surgical History:  Procedure Laterality Date  . APPENDECTOMY    . CARDIAC CATHETERIZATION  02/20/94  . CATARACT EXTRACTION, BILATERAL    . CHOLECYSTECTOMY    . CORONARY ARTERY BYPASS GRAFT  2001   LIMA to LAD,SVG to PDA  . CORONARY ARTERY BYPASS GRAFT     2 vessel 2001  . CYSTOSCOPY WITH RETROGRADE PYELOGRAM, URETEROSCOPY AND STENT PLACEMENT Left 05/18/2014   Procedure: CYSTOSCOPY WITH RETROGRADE PYELOGRAM, URETEROSCOPY AND  STENT PLACEMENT;  Surgeon: Raynelle Bring, MD;  [facility];  Service: Urology;  Laterality: Left;  . CYSTOSCOPY WITH RETROGRADE PYELOGRAM, URETEROSCOPY AND STENT PLACEMENT Right 08/21/2014   Procedure: CYSTOSCOPY WITH RETROGRADE PYELOGRAM, URETEROSCOPY AND STENT PLACEMENT WITH STONE EXTRACTION basket;  Surgeon: Malka So, MD;  [facility];  Service: Urology;  Laterality: Right;  . HERNIA REPAIR    . HOLMIUM LASER APPLICATION Left [id number]   Procedure: HOLMIUM LASER APPLICATION;  Surgeon: Raynelle Bring, MD;  [facility];  Service: Urology;  Laterality: Left;  . LUMBAR LAMINECTOMY/DECOMPRESSION MICRODISCECTOMY Left 03/07/2013   Procedure: LUMBAR FOUR-FIVE EXTRAFORAMINAL LUMBAR LAMINECTOMY/DECOMPRESSION MICRODISCECTOMY 1 LEVEL;  Surgeon: Charlie Pitter, MD;  Location: [place] [facility];  Service: Neurosurgery;  Laterality: Left;  Left lumbar four-five extraforaminal microdiskectomy  . LUMBAR LAMINECTOMY/DECOMPRESSION MICRODISCECTOMY Left 01/15/2018   Procedure: Microdiscectomy - left - Lumbar Four -Lumbar Five extraforaminal;  Surgeon: Earnie Larsson, MD;  Location: [place];  Service: Neurosurgery;  Laterality: Left;  Microdiscectomy - left - Lumbar Four -Lumbar Five extraforaminal  . PROSTATECTOMY    . TONSILLECTOMY      There were no vitals filed for this visit.  Subjective Assessment - 06/08/19 1710    Subjective  Been walking some and I can tell that  my steps are bigger.    Patient is accompained by:  --    Patient Stated Goals  Getting up and down better, strengthening, and walking better.    Currently in Pain?  No/denies    Pain Onset  More than a month ago                       [facility] Adult PT Treatment/Exercise - 06/09/19 1305      Transfers   Transfers  Sit to Stand;Stand to Sit    Sit to Stand  5: Supervision;With upper extremity assist;With armrests;From bed;From chair/3-in-1    Sit to Stand Details  Tactile cues for initiation;Tactile cues for  sequencing;Verbal cues for technique;Verbal cues for sequencing;Tactile cues for placement;Tactile cues for weight beaing    Stand to Sit  With upper extremity assist;To chair/3-in-1;To bed;5: Supervision    Stand to Sit Details (indicate cue type and reason)  Verbal cues for sequencing;Verbal cues for technique    Number of Reps  2 sets;10 reps   from mat, from chair   Transfer Cueing  Pt needs repeated cues for technique for increased forward lean and for foot placement, for improved steadiness upon standing.  Pt has tendency to want to push mat/chair with back of legs      Ambulation/Gait   Ambulation/Gait  Yes    Ambulation/Gait Assistance  5: Supervision    Ambulation Distance (Feet)  230 Feet   115 ft, 100 ft x 2   Assistive device  Straight cane    Gait Pattern  Step-through pattern;Decreased arm swing - left;Decreased trunk rotation;Narrow base of support;Trunk flexed;Decreased step length - right;Decreased step length - left;Decreased dorsiflexion - right;Decreased dorsiflexion - left   improved foot clearance with cues   Ambulation Surface  Level;Indoor    Gait Comments  Overall, improved step length and foot clearance.  After about 50 ft of gait, pt tends to scuff heels on floor, responds to verbal cues for increased step length and foot clearance.      High Level Balance   High Level Balance Comments  Forward step and weightshift x 10 reps, back step and weightshfit x 10 reps, forward<>back step and weightshift x 10 reps, all at parallel bars, for UE support.  Cues for increased step length/foot clearance and posture.        PWR [facility]) - 06/08/19 1715    PWR! exercises  Moves in sitting;Moves in standing    PWR! Up  x 10 reps    PWR! Rock  x 10 reps each side, holding with 1 UE support    PWR Step  x 10 reps each side, with bilat UE support    Comments-PWR! Moves Standing  Visual, verbal, tactile cues for technique.  PWR! Up for posture, PWR! Rock for [employer], PWR!  step for initiation of stepping    PWR! Up  x 10    PWR! Rock  x 5 reps each side    PWR! Twist  x 5 reps each side    PWR! Step  x 5 reps each side, single step; trialed double step out to the side and in   better performed with single step out/in   Comments-PWR! Moves sitting  Review of HEP given last visit, with pt return demo understanding.          PT Education - 06/09/19 1415    Education Details  Sit<>stand technique as part of HEP for functional strengthening  Person(s) Educated  Patient    Methods  Explanation;Demonstration;Verbal cues;Handout    Comprehension  Verbalized understanding;Returned demonstration;Verbal cues required;Need further instruction       PT Short Term Goals - 05/24/19 1456      PT SHORT TERM GOAL #1   Title  Pt will perform HEP independently to address Parkinson's specific deficits.  TARGET 06/24/2019    Time  4    Period  Weeks    Status  New    Target Date  06/24/19      PT SHORT TERM GOAL #2   Title  Pt will improve 5x sit<>stand to less than or equal to 25 seconds for improved functional lower extremity strength and transfer effiiency.    Time  4    Period  Weeks    Status  New    Target Date  06/24/19      PT SHORT TERM GOAL #3   Title  Pt will improve TUG score to less than or equal to 20 seconds for decreased fall risk.    Time  4    Period  Weeks    Status  New    Target Date  06/24/19      PT SHORT TERM GOAL #4   Title  Pt will improve gait velocity to at least 2 ft/sec for improved gait efficiency and safety.    Time  4    Period  Weeks    Status  New    Target Date  06/24/19      PT SHORT TERM GOAL #5   Title  Pt will verbalize understanding of fall prevention in home environment.    Time  8    Period  Weeks    Status  New    Target Date  06/24/19        PT Long Term Goals - 05/24/19 1638      PT LONG TERM GOAL #1   Title  Pt will be independent with progression of HEP and transition to optimal ongoing fitness  program upon d/c from PT.  TARGET 07/22/2019    Time  8    Period  Weeks    Status  New    Target Date  07/22/19      PT LONG TERM GOAL #2   Title  Pt will improve 5x sit<>stand score to less than or equal to 20 seconds for improved functional lower extremity strength and improved transfer efficiency.    Time  8    Period  Weeks    Target Date  07/22/19      PT LONG TERM GOAL #3   Title  Pt will improve TUG score to less than or equal to 15 seconds for decreased fall risk.    Time  8    Period  Weeks    Status  New    Target Date  07/22/19      PT LONG TERM GOAL #4   Title  Pt will improve DGI score to at least 19/24 for decreased fall risk.    Time  8    Period  Weeks    Status  New    Target Date  07/22/19      PT LONG TERM GOAL #5   Title  Pt will improve gait velocity to at least 2.3 ft/sec for improved gait efficiency and safety.    Time  8    Period  Weeks    Status  New  Target Date  07/22/19            Plan - 06/09/19 1417    Clinical Impression Statement  Pt does not report any dizziness during PT session today.  Focused on review of seated PWR! Moves and worked also on standing PWR! Moves and stepping activities for improved overall intensity of movement patterns.  Pt demonstrates today improved step lnegth and foot clearance over last visit.  Also address functional strengthening with sit<>stand transfers, with pt needing cues for correct technique.  Pt will continue to benefit from skilled PT to address strength, balance, and gait training for improved overall mobility/decreased fall risk.    Personal Factors and Comorbidities  Comorbidity 3+    Comorbidities  microdiscectomy L4-5 12/2017, vertigo, low BP, A-fib, AAA, OA, CABG 2075mild increased tone LUE    Examination-Activity Limitations  Locomotion Level;Transfers    Examination-Participation Restrictions  Community Activity    Stability/Clinical Decision Making  Evolving/Moderate complexity    Rehab  Potential  Good    PT Frequency  2x / week    PT Duration  8 weeks   plus eval   PT Treatment/Interventions  ADLs/Self Care Home Management;Gait training;Stair training;Functional mobility training;Therapeutic activities;Therapeutic exercise;Balance training;Neuromuscular re-education;Patient/family education    PT Next Visit Plan  Review sit<>stand training/repetitions as part of HEP this visit; work on standing PWR! Moves, stepping and hip/ankle strategies.  Forward step ups for strengthening    Consulted and Agree with Plan of Care  Patient       Patient will benefit from skilled therapeutic intervention in order to improve the following deficits and impairments:  Abnormal gait, Difficulty walking, Decreased balance, Decreased mobility, Decreased strength  Visit Diagnosis: Unsteadiness on feet  Other abnormalities of gait and mobility  Muscle weakness (generalized)     Problem List Patient Active Problem List   Diagnosis Date Noted  . Dyslipidemia (high LDL; low HDL) 07/15/2018  . Long term current use of anticoagulant 07/15/2018  . Lumbar disc herniation 01/15/2018  . Parkinson's disease ([place]) 08/01/2017  . Coronary artery disease involving native coronary artery of native heart without angina pectoris 07/10/2017  . Acute renal insufficiency 08/22/2014  . AAA (abdominal aortic aneurysm) without rupture ([place]) 08/22/2014  . Diverticulosis of colon without hemorrhage 08/22/2014  . Right ureteral stone 08/21/2014  . Hyperlipidemia 05/31/2013  . Essential hypertension 03/03/2013  . Leukocytosis, unspecified 03/03/2013  . 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 Chronic a-fib ([place]) 03/02/2013    , W. 06/09/2019, 2:22 PM Frazier Butt., PT [facility] [address] [place], [name], [address] Phone: [phone]   Fax:  [phone]  Name: CHRSTOPHER DOWTIN MRN: [id number] Date of Birth: [birth year]

## 2019-06-10 ENCOUNTER — Ambulatory Visit: Payer: Medicare Other | Admitting: Physical Therapy

## 2019-06-10 ENCOUNTER — Other Ambulatory Visit: Payer: Self-pay

## 2019-06-10 ENCOUNTER — Encounter: Payer: Self-pay | Admitting: Physical Therapy

## 2019-06-10 DIAGNOSIS — M6281 Muscle weakness (generalized): Secondary | ICD-10-CM

## 2019-06-10 DIAGNOSIS — R2681 Unsteadiness on feet: Secondary | ICD-10-CM | POA: Diagnosis not present

## 2019-06-10 DIAGNOSIS — R2689 Other abnormalities of gait and mobility: Secondary | ICD-10-CM

## 2019-06-10 NOTE — Therapy (Signed)
[facility] [address] [place], [name], [address] Phone: [phone]   Fax:  [phone]  Physical Therapy Treatment  Patient Details  Name: Adam Kane MRN: [id number] Date of Birth: May 24, 1933 Referring Provider (PT): Tat, Wells Guiles   Encounter Date: 06/10/2019  PT End of Session - 06/10/19 1414    Visit Number  4    Number of Visits  17    Date for PT Re-Evaluation  08/20/19    Authorization Type  UHC Medicare    PT Start Time  [id number]    PT Stop Time  1402    PT Time Calculation (min)  45 min    Equipment Utilized During Treatment  Gait belt    Activity Tolerance  Patient tolerated treatment well    Behavior During Therapy  [facility] for tasks assessed/performed       Past Medical History:  Diagnosis Date  . A-fib ([place])   . Abdominal aortic aneurysm (AAA), 30-34 mm diameter (HCC)   . Arthritis   . CAD (coronary artery disease)   . Cancer [facility])    prostate cancer 2009  . Chronic kidney disease    kidney stone  . Complication of anesthesia    if given anesthesia too quickly experiences nausea and vomiting   . Diverticulosis of colon without hemorrhage   . Dyslipidemia   . Dysrhythmia   . Early-onset Parkinson's disease ([place])   . GERD (gastroesophageal reflux disease)   . Heart murmur    childhood  . History of kidney stones   . PONV (postoperative nausea and vomiting)   . S/P CABG (coronary artery bypass graft) 2001  . Shortness of breath    mild exertion due to afib  . Systemic hypertension   . Urinary leakage     Past Surgical History:  Procedure Laterality Date  . APPENDECTOMY    . CARDIAC CATHETERIZATION  02/20/94  . CATARACT EXTRACTION, BILATERAL    . CHOLECYSTECTOMY    . CORONARY ARTERY BYPASS GRAFT  2001   LIMA to LAD,SVG to PDA  . CORONARY ARTERY BYPASS GRAFT     2 vessel 2001  . CYSTOSCOPY WITH RETROGRADE PYELOGRAM, URETEROSCOPY AND STENT PLACEMENT Left 05/18/2014   Procedure:  CYSTOSCOPY WITH RETROGRADE PYELOGRAM, URETEROSCOPY AND STENT PLACEMENT;  Surgeon: Raynelle Bring, MD;  [facility];  Service: Urology;  Laterality: Left;  . CYSTOSCOPY WITH RETROGRADE PYELOGRAM, URETEROSCOPY AND STENT PLACEMENT Right 08/21/2014   Procedure: CYSTOSCOPY WITH RETROGRADE PYELOGRAM, URETEROSCOPY AND STENT PLACEMENT WITH STONE EXTRACTION basket;  Surgeon: Malka So, MD;  [facility];  Service: Urology;  Laterality: Right;  . HERNIA REPAIR    . HOLMIUM LASER APPLICATION Left [id number]   Procedure: HOLMIUM LASER APPLICATION;  Surgeon: Raynelle Bring, MD;  [facility];  Service: Urology;  Laterality: Left;  . LUMBAR LAMINECTOMY/DECOMPRESSION MICRODISCECTOMY Left 03/07/2013   Procedure: LUMBAR FOUR-FIVE EXTRAFORAMINAL LUMBAR LAMINECTOMY/DECOMPRESSION MICRODISCECTOMY 1 LEVEL;  Surgeon: Charlie Pitter, MD;  Location: [place] [facility];  Service: Neurosurgery;  Laterality: Left;  Left lumbar four-five extraforaminal microdiskectomy  . LUMBAR LAMINECTOMY/DECOMPRESSION MICRODISCECTOMY Left 01/15/2018   Procedure: Microdiscectomy - left - Lumbar Four -Lumbar Five extraforaminal;  Surgeon: Earnie Larsson, MD;  Location: [place];  Service: Neurosurgery;  Laterality: Left;  Microdiscectomy - left - Lumbar Four -Lumbar Five extraforaminal  . PROSTATECTOMY    . TONSILLECTOMY      There were no vitals filed for this visit.  Subjective Assessment - 06/10/19 1318  Subjective  No changes since last viist.  No pain today, mostly pain is at night.    Patient Stated Goals  Getting up and down better, strengthening, and walking better.    Currently in Pain?  No/denies    Pain Onset  More than a month ago                       [facility] Adult PT Treatment/Exercise - 06/10/19 0001      Transfers   Transfers  Sit to Stand;Stand to Sit    Sit to Stand  5: Supervision;With upper extremity assist;With armrests;From bed;From chair/3-in-1    Sit to Stand Details  Verbal cues for  sequencing;Verbal cues for technique    Stand to Sit  With upper extremity assist;To chair/3-in-1;To bed;5: Supervision    Stand to Sit Details (indicate cue type and reason)  Verbal cues for sequencing;Verbal cues for technique    Number of Reps  Other reps (comment)   5 reps from mat, 10 reps from chair for functional strength   Transfer Cueing  Pt demo understanding of HEP given last visit (with attention to task), when just asked to stand to go to parallel bars, pt does have episode of posterior lean.        Ambulation/Gait   Ambulation/Gait  Yes    Ambulation/Gait Assistance  5: Supervision    Ambulation Distance (Feet)  100 Feet   x 2, 115 ft x 2   Assistive device  Straight cane    Gait Pattern  Step-through pattern;Decreased arm swing - left;Decreased trunk rotation;Narrow base of support;Trunk flexed;Decreased step length - right;Decreased step length - left;Decreased dorsiflexion - right;Decreased dorsiflexion - left;Poor foot clearance - left;Poor foot clearance - right    Ambulation Surface  Level;Indoor    Gait Comments  Short distance gait (TUG shuttle)-sit>stand, walk 10 ft, stand<>sit, walk 10 ft and repeat.  Performed x 2 reps, with cues for increased step length/foot clearance.      High Level Balance   High Level Balance Comments  Forward step and weightshift x 10 reps, back step and weightshfit x 10 reps, forward<>back step and weightshift x 10 reps, all at parallel bars, for UE support.  Cues for increased step length/foot clearance and posture.      Exercises   Exercises  Knee/Hip;Ankle      Knee/Hip Exercises: Stretches   Active Hamstring Stretch  Right;Left;3 reps;30 seconds    Active Hamstring Stretch Limitations  foot propped on floor    Gastroc Stretch  Right;Left;3 reps;30 seconds    Gastroc Stretch Limitations  STanding with BUE support, foot propped at 4" step        PWR [facility]) - 06/10/19 1335    PWR! exercises  Moves in standing    PWR! Up  x 10 reps     PWR! Rock  x 10 reps each side with 1 UE support   Initiated rocking through BLES only with UE support   PWR Step  x 10 reps each side with 1 UE support   cues for increased step length and foot clearance   Comments  Visual, verbal cues for technique, large amplitude of movement       Balance Exercises - 06/10/19 1341      Balance Exercises: Standing   Retro Gait  Upper extremity support;5 reps   Forward/back walk at counter   Sidestepping  Upper extremity support;3 reps   At [employer]  Limitations  Marching in place x 10, forward marching 2 sets x 10 reps at counter    Heel Raises Limitations  2 sets x 10 reps    Toe Raise Limitations  2 sets x 10 reps    Other Standing Exercises  Stagger stance forward/back rock 2 sets x 10 reps          PT Short Term Goals - 05/24/19 1456      PT SHORT TERM GOAL #1   Title  Pt will perform HEP independently to address Parkinson's specific deficits.  TARGET 06/24/2019    Time  4    Period  Weeks    Status  New    Target Date  06/24/19      PT SHORT TERM GOAL #2   Title  Pt will improve 5x sit<>stand to less than or equal to 25 seconds for improved functional lower extremity strength and transfer effiiency.    Time  4    Period  Weeks    Status  New    Target Date  06/24/19      PT SHORT TERM GOAL #3   Title  Pt will improve TUG score to less than or equal to 20 seconds for decreased fall risk.    Time  4    Period  Weeks    Status  New    Target Date  06/24/19      PT SHORT TERM GOAL #4   Title  Pt will improve gait velocity to at least 2 ft/sec for improved gait efficiency and safety.    Time  4    Period  Weeks    Status  New    Target Date  06/24/19      PT SHORT TERM GOAL #5   Title  Pt will verbalize understanding of fall prevention in home environment.    Time  8    Period  Weeks    Status  New    Target Date  06/24/19        PT Long Term Goals - 05/24/19 1638      PT LONG TERM GOAL #1   Title  Pt  will be independent with progression of HEP and transition to optimal ongoing fitness program upon d/c from PT.  TARGET 07/22/2019    Time  8    Period  Weeks    Status  New    Target Date  07/22/19      PT LONG TERM GOAL #2   Title  Pt will improve 5x sit<>stand score to less than or equal to 20 seconds for improved functional lower extremity strength and improved transfer efficiency.    Time  8    Period  Weeks    Target Date  07/22/19      PT LONG TERM GOAL #3   Title  Pt will improve TUG score to less than or equal to 15 seconds for decreased fall risk.    Time  8    Period  Weeks    Status  New    Target Date  07/22/19      PT LONG TERM GOAL #4   Title  Pt will improve DGI score to at least 19/24 for decreased fall risk.    Time  8    Period  Weeks    Status  New    Target Date  07/22/19      PT LONG TERM GOAL #5   Title  Pt will improve gait velocity to at least 2.3 ft/sec for improved gait efficiency and safety.    Time  8    Period  Weeks    Status  New    Target Date  07/22/19            Plan - 06/10/19 1415    Clinical Impression Statement  Focus of skilled PT session today is functional strengthening, balance, and gait.  Pt demonstrates improvement with sit<>stand transfers, with less episode of pushing posteriorly at knees onto surface as compared to last visit.  While pt is demonstrating improved step length with gait, he does revert back to decreased heelstrike and foot clearance after 50 ft or so.  He will conitnue to beneift from skilled PT to address strength, balance, gait training for imrpoved mobility/decreased falls.    Personal Factors and Comorbidities  Comorbidity 3+    Comorbidities  microdiscectomy L4-5 12/2017, vertigo, low BP, A-fib, AAA, OA, CABG 2038mild increased tone LUE    Examination-Activity Limitations  Locomotion Level;Transfers    Examination-Participation Restrictions  Community Activity    Stability/Clinical Decision Making   Evolving/Moderate complexity    Rehab Potential  Good    PT Frequency  2x / week    PT Duration  8 weeks   plus eval   PT Treatment/Interventions  ADLs/Self Care Home Management;Gait training;Stair training;Functional mobility training;Therapeutic activities;Therapeutic exercise;Balance training;Neuromuscular re-education;Patient/family education    PT Next Visit Plan  Continue to work on sit<>stand, initiation of gait/self-correction for improved step length; hip/ankle/step strategies for balance, functional strengthening(step ups, TUG shuttle); standing PWR! Moves added to HEP as able    Consulted and Agree with Plan of Care  Patient       Patient will benefit from skilled therapeutic intervention in order to improve the following deficits and impairments:  Abnormal gait, Difficulty walking, Decreased balance, Decreased mobility, Decreased strength  Visit Diagnosis: Unsteadiness on feet  Other abnormalities of gait and mobility  Muscle weakness (generalized)     Problem List Patient Active Problem List   Diagnosis Date Noted  . Dyslipidemia (high LDL; low HDL) 07/15/2018  . Long term current use of anticoagulant 07/15/2018  . Lumbar disc herniation 01/15/2018  . Parkinson's disease ([place]) 08/01/2017  . Coronary artery disease involving native coronary artery of native heart without angina pectoris 07/10/2017  . Acute renal insufficiency 08/22/2014  . AAA (abdominal aortic aneurysm) without rupture ([place]) 08/22/2014  . Diverticulosis of colon without hemorrhage 08/22/2014  . Right ureteral stone 08/21/2014  . Hyperlipidemia 05/31/2013  . Essential hypertension 03/03/2013  . Leukocytosis, unspecified 03/03/2013  . 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 Chronic a-fib ([place]) 03/02/2013    Suvi Archuletta W. 06/10/2019, 2:19 PM Frazier Butt., PT  [facility] [address] [place], [name], [address] Phone: [phone]   Fax:  [phone]  Name: Adam Kane MRN: [id number] Date of Birth: 1932-12-10

## 2019-06-14 ENCOUNTER — Ambulatory Visit: Payer: Medicare Other | Admitting: Physical Therapy

## 2019-06-17 ENCOUNTER — Encounter: Payer: Self-pay | Admitting: Physical Therapy

## 2019-06-17 ENCOUNTER — Other Ambulatory Visit: Payer: Self-pay

## 2019-06-17 ENCOUNTER — Ambulatory Visit: Payer: Medicare Other | Admitting: Physical Therapy

## 2019-06-17 DIAGNOSIS — R2681 Unsteadiness on feet: Secondary | ICD-10-CM

## 2019-06-17 DIAGNOSIS — M6281 Muscle weakness (generalized): Secondary | ICD-10-CM

## 2019-06-17 NOTE — Patient Instructions (Addendum)
Calf / Gastoc: Runners' Stretch I    One leg back and straight, other forward and bent supporting weight, lean forward, holding onto the counter for support, gently stretching calf of back leg. Hold _15-30___ seconds. Repeat with other leg. Repeat __3__ times. Do __1-2__ sessions per day.  Copyright  VHI. All rights reserved.  Toe / Heel Raise    Stand at the counter for support.  Gently rock back on heels and raise toes. Then rock forward on toes and raise heels. Repeat sequence __2 sets of 10 __ times per session. Do ___1-2_ sessions per day.  Copyright  VHI. All rights reserved.

## 2019-06-18 NOTE — Therapy (Signed)
[facility] [address] [place], [name], [address] Phone: [phone]   Fax:  [phone]  Physical Therapy Treatment  Patient Details  Name: Adam Kane MRN: [id number] Date of Birth: October 07, 1932 Referring Provider (PT): Tat, Wells Guiles   Encounter Date: 06/17/2019  PT End of Session - 06/18/19 1737    Visit Number  5    Number of Visits  17    Date for PT Re-Evaluation  08/20/19    Authorization Type  U[employer] Medicare    PT Start Time  1018    PT Stop Time  1056    PT Time Calculation (min)  38 min    Activity Tolerance  Patient tolerated treatment well    Behavior During Therapy  [facility] for tasks assessed/performed       Past Medical History:  Diagnosis Date  . A-fib ([place])   . Abdominal aortic aneurysm (AAA), 30-34 mm diameter (HCC)   . Arthritis   . CAD (coronary artery disease)   . Cancer [facility])    prostate cancer 2009  . Chronic kidney disease    kidney stone  . Complication of anesthesia    if given anesthesia too quickly experiences nausea and vomiting   . Diverticulosis of colon without hemorrhage   . Dyslipidemia   . Dysrhythmia   . Early-onset Parkinson's disease ([place])   . GERD (gastroesophageal reflux disease)   . Heart murmur    childhood  . History of kidney stones   . PONV (postoperative nausea and vomiting)   . S/P CABG (coronary artery bypass graft) 2001  . Shortness of breath    mild exertion due to afib  . Systemic hypertension   . Urinary leakage     Past Surgical History:  Procedure Laterality Date  . APPENDECTOMY    . CARDIAC CATHETERIZATION  02/20/94  . CATARACT EXTRACTION, BILATERAL    . CHOLECYSTECTOMY    . CORONARY ARTERY BYPASS GRAFT  2001   LIMA to LAD,SVG to PDA  . CORONARY ARTERY BYPASS GRAFT     2 vessel 2001  . CYSTOSCOPY WITH RETROGRADE PYELOGRAM, URETEROSCOPY AND STENT PLACEMENT Left 05/18/2014   Procedure: CYSTOSCOPY WITH RETROGRADE PYELOGRAM, URETEROSCOPY AND  STENT PLACEMENT;  Surgeon: Raynelle Bring, MD;  [facility];  Service: Urology;  Laterality: Left;  . CYSTOSCOPY WITH RETROGRADE PYELOGRAM, URETEROSCOPY AND STENT PLACEMENT Right 08/21/2014   Procedure: CYSTOSCOPY WITH RETROGRADE PYELOGRAM, URETEROSCOPY AND STENT PLACEMENT WITH STONE EXTRACTION basket;  Surgeon: Malka So, MD;  [facility];  Service: Urology;  Laterality: Right;  . HERNIA REPAIR    . HOLMIUM LASER APPLICATION Left [id number]   Procedure: HOLMIUM LASER APPLICATION;  Surgeon: Raynelle Bring, MD;  [facility];  Service: Urology;  Laterality: Left;  . LUMBAR LAMINECTOMY/DECOMPRESSION MICRODISCECTOMY Left 03/07/2013   Procedure: LUMBAR FOUR-FIVE EXTRAFORAMINAL LUMBAR LAMINECTOMY/DECOMPRESSION MICRODISCECTOMY 1 LEVEL;  Surgeon: Charlie Pitter, MD;  Location: [facility];  Service: Neurosurgery;  Laterality: Left;  Left lumbar four-five extraforaminal microdiskectomy  . LUMBAR LAMINECTOMY/DECOMPRESSION MICRODISCECTOMY Left 01/15/2018   Procedure: Microdiscectomy - left - Lumbar Four -Lumbar Five extraforaminal;  Surgeon: Earnie Larsson, MD;  Location: [facility];  Service: Neurosurgery;  Laterality: Left;  Microdiscectomy - left - Lumbar Four -Lumbar Five extraforaminal  . PROSTATECTOMY    . TONSILLECTOMY      There were no vitals filed for this visit.  Subjective Assessment - 06/17/19 1021    Subjective  My son-in-law was very impressed about my step  length, but I still lost my balance backwards upon standing at their house.    Patient Stated Goals  Getting up and down better, strengthening, and walking better.    Currently in Pain?  No/denies    Pain Onset  More than a month ago                       [facility] Adult PT Treatment/Exercise - 06/17/19 1023      Transfers   Transfers  Sit to Stand;Stand to Sit    Sit to Stand  5: Supervision;With upper extremity assist;With armrests;From bed;From chair/3-in-1    Sit to Stand Details  Verbal cues for  sequencing;Verbal cues for technique    Sit to Stand Details (indicate cue type and reason)  Cues for increased anterior lean, "nose over toes" and to keep feet fully flat on ground (versus toes lifting up from floor)    Stand to Sit  With upper extremity assist;To chair/3-in-1;To bed;5: Supervision    Number of Reps  2 sets;10 reps   from 24" mat, from 18" chair   Comments  Review of technique for sit<>stand transfers, with cues needed initially for increased forward lean.      Exercises   Exercises  Knee/Hip;Ankle      Knee/Hip Exercises: Stretches   Gastroc Stretch  Right;Left;3 reps;30 seconds    Gastroc Stretch Limitations  STanding with BUE support, foot propped at 4" step (cabinet shelf)    Other Knee/Hip Stretches  Runner's stretch, at counter, 3 reps x 30 seconds each      Knee/Hip Exercises: Aerobic   Stepper  Seated SciFit Stepper, Level 1.5, 4 extremities x 6  minutes, with RPM >70 for improved flexibility and lower extremity strength          Balance Exercises - 06/17/19 1041      Balance Exercises: Standing   Wall Bumps  Hip    Wall Bumps-Hips  Eyes opened;Anterior/posterior;10 reps   2 sets, counter, performed also as hamstring/lowbackstretch   Stepping Strategy  Anterior;Posterior;10 reps;UE support   Fwd/back step/weightshift10 reps, cues to incr step length   Marching Limitations  Marching in place x 10, at counter with UE support    Heel Raises Limitations  2 sets x 10 reps    Toe Raise Limitations  2 sets x 10 reps    Other Standing Exercises  Stagger stance forward/back rock 2 sets x 10 reps        PT Education - 06/18/19 1736    Education Details  Additions to HEP-see instructions    Person(s) Educated  Patient    Methods  Explanation;Demonstration;Handout    Comprehension  Verbalized understanding;Returned demonstration       PT Short Term Goals - 05/24/19 1456      PT SHORT TERM GOAL #1   Title  Pt will perform HEP independently to address  Parkinson's specific deficits.  TARGET 06/24/2019    Time  4    Period  Weeks    Status  New    Target Date  06/24/19      PT SHORT TERM GOAL #2   Title  Pt will improve 5x sit<>stand to less than or equal to 25 seconds for improved functional lower extremity strength and transfer effiiency.    Time  4    Period  Weeks    Status  New    Target Date  06/24/19      PT SHORT TERM  GOAL #3   Title  Pt will improve TUG score to less than or equal to 20 seconds for decreased fall risk.    Time  4    Period  Weeks    Status  New    Target Date  06/24/19      PT SHORT TERM GOAL #4   Title  Pt will improve gait velocity to at least 2 ft/sec for improved gait efficiency and safety.    Time  4    Period  Weeks    Status  New    Target Date  06/24/19      PT SHORT TERM GOAL #5   Title  Pt will verbalize understanding of fall prevention in home environment.    Time  8    Period  Weeks    Status  New    Target Date  06/24/19        PT Long Term Goals - 05/24/19 1638      PT LONG TERM GOAL #1   Title  Pt will be independent with progression of HEP and transition to optimal ongoing fitness program upon d/c from PT.  TARGET 07/22/2019    Time  8    Period  Weeks    Status  New    Target Date  07/22/19      PT LONG TERM GOAL #2   Title  Pt will improve 5x sit<>stand score to less than or equal to 20 seconds for improved functional lower extremity strength and improved transfer efficiency.    Time  8    Period  Weeks    Target Date  07/22/19      PT LONG TERM GOAL #3   Title  Pt will improve TUG score to less than or equal to 15 seconds for decreased fall risk.    Time  8    Period  Weeks    Status  New    Target Date  07/22/19      PT LONG TERM GOAL #4   Title  Pt will improve DGI score to at least 19/24 for decreased fall risk.    Time  8    Period  Weeks    Status  New    Target Date  07/22/19      PT LONG TERM GOAL #5   Title  Pt will improve gait velocity to at  least 2.3 ft/sec for improved gait efficiency and safety.    Time  8    Period  Weeks    Status  New    Target Date  07/22/19            Plan - 06/18/19 1737    Clinical Impression Statement  Pt seen once this week, due to missing earlier visit this week (pt mixed up appointment time).  Pt requires additional practice and cues for sit<>stand technique today, as he still has some episodes of posterior lean upon standing.  Additionally, continued to work on balance strategies-hip, ankle, step strategies for balance and for step length with gait.  He will continue to benefit from skilled PT to address strength, balance, and gait for improved mobility, decreased fallrisk.    Personal Factors and Comorbidities  Comorbidity 3+    Comorbidities  microdiscectomy L4-5 12/2017, vertigo, low BP, A-fib, AAA, OA, CABG 2071mild increased tone LUE    Examination-Activity Limitations  Locomotion Level;Transfers    Examination-Participation Restrictions  Community Activity    Stability/Clinical Decision Making  Evolving/Moderate complexity    Rehab Potential  Good    PT Frequency  2x / week    PT Duration  8 weeks   plus eval   PT Treatment/Interventions  ADLs/Self Care Home Management;Gait training;Stair training;Functional mobility training;Therapeutic activities;Therapeutic exercise;Balance training;Neuromuscular re-education;Patient/family education    PT Next Visit Plan  Review additions to HEP; Continue to work on sit<>stand-varied surfaces and compliant standing surfaces, initiation of gait/self-correction for improved step length; hip/ankle/step strategies for balance, functional strengthening(step ups, TUG shuttle); standing PWR! Moves added to HEP as able    Consulted and Agree with Plan of Care  Patient       Patient will benefit from skilled therapeutic intervention in order to improve the following deficits and impairments:  Abnormal gait, Difficulty walking, Decreased balance, Decreased  mobility, Decreased strength  Visit Diagnosis: Unsteadiness on feet  Muscle weakness (generalized)     Problem List Patient Active Problem List   Diagnosis Date Noted  . Dyslipidemia (high LDL; low HDL) 07/15/2018  . Long term current use of anticoagulant 07/15/2018  . Lumbar disc herniation 01/15/2018  . Parkinson's disease ([place]) 08/01/2017  . Coronary artery disease involving native coronary artery of native heart without angina pectoris 07/10/2017  . Acute renal insufficiency 08/22/2014  . AAA (abdominal aortic aneurysm) without rupture ([place]) 08/22/2014  . Diverticulosis of colon without hemorrhage 08/22/2014  . Right ureteral stone 08/21/2014  . Hyperlipidemia 05/31/2013  . Essential hypertension 03/03/2013  . Leukocytosis, unspecified 03/03/2013  . 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 Chronic a-fib ([place]) 03/02/2013    Evanne Matsunaga W. 06/18/2019, 5:40 PM  Frazier Butt., PT   [facility] [address] [place], [name], [address] Phone: [phone]   Fax:  [phone]  Name: Adam Kane MRN: [id number] Date of Birth: 04/15/1933

## 2019-06-21 ENCOUNTER — Encounter: Payer: Self-pay | Admitting: Physical Therapy

## 2019-06-21 ENCOUNTER — Ambulatory Visit: Payer: Medicare Other | Admitting: Physical Therapy

## 2019-06-21 ENCOUNTER — Other Ambulatory Visit: Payer: Self-pay

## 2019-06-21 DIAGNOSIS — R2681 Unsteadiness on feet: Secondary | ICD-10-CM | POA: Diagnosis not present

## 2019-06-21 DIAGNOSIS — R2689 Other abnormalities of gait and mobility: Secondary | ICD-10-CM

## 2019-06-21 DIAGNOSIS — M6281 Muscle weakness (generalized): Secondary | ICD-10-CM

## 2019-06-21 NOTE — Patient Instructions (Addendum)
   Perform 10 reps 1 set each day.  Do not perform Twist; use counter for support

## 2019-06-22 NOTE — Therapy (Signed)
[facility] [address] [place], [name], [address] Phone: [phone]   Fax:  [phone]  Physical Therapy Treatment  Patient Details  Name: Adam Kane MRN: [id number] Date of Birth: 1932-09-03 Referring Provider (PT): Tat, Wells Guiles   Encounter Date: 06/21/2019  PT End of Session - 06/22/19 1426    Visit Number  6    Number of Visits  17    Date for PT Re-Evaluation  08/20/19    Authorization Type  UHC Medicare    PT Start Time  1320    PT Stop Time  1400    PT Time Calculation (min)  40 min    Activity Tolerance  Patient tolerated treatment well;Patient limited by fatigue   Reports he didn't sleep well last night   Behavior During Therapy  [facility] for tasks assessed/performed       Past Medical History:  Diagnosis Date  . A-fib ([place])   . Abdominal aortic aneurysm (AAA), 30-34 mm diameter (HCC)   . Arthritis   . CAD (coronary artery disease)   . Cancer [facility])    prostate cancer 2009  . Chronic kidney disease    kidney stone  . Complication of anesthesia    if given anesthesia too quickly experiences nausea and vomiting   . Diverticulosis of colon without hemorrhage   . Dyslipidemia   . Dysrhythmia   . Early-onset Parkinson's disease ([place])   . GERD (gastroesophageal reflux disease)   . Heart murmur    childhood  . History of kidney stones   . PONV (postoperative nausea and vomiting)   . S/P CABG (coronary artery bypass graft) 2001  . Shortness of breath    mild exertion due to afib  . Systemic hypertension   . Urinary leakage     Past Surgical History:  Procedure Laterality Date  . APPENDECTOMY    . CARDIAC CATHETERIZATION  02/20/94  . CATARACT EXTRACTION, BILATERAL    . CHOLECYSTECTOMY    . CORONARY ARTERY BYPASS GRAFT  2001   LIMA to LAD,SVG to PDA  . CORONARY ARTERY BYPASS GRAFT     2 vessel 2001  . CYSTOSCOPY WITH RETROGRADE PYELOGRAM, URETEROSCOPY AND STENT PLACEMENT Left 05/18/2014    Procedure: CYSTOSCOPY WITH RETROGRADE PYELOGRAM, URETEROSCOPY AND STENT PLACEMENT;  Surgeon: Raynelle Bring, MD;  [facility];  Service: Urology;  Laterality: Left;  . CYSTOSCOPY WITH RETROGRADE PYELOGRAM, URETEROSCOPY AND STENT PLACEMENT Right 08/21/2014   Procedure: CYSTOSCOPY WITH RETROGRADE PYELOGRAM, URETEROSCOPY AND STENT PLACEMENT WITH STONE EXTRACTION basket;  Surgeon: Malka So, MD;  [facility];  Service: Urology;  Laterality: Right;  . HERNIA REPAIR    . HOLMIUM LASER APPLICATION Left [id number]   Procedure: HOLMIUM LASER APPLICATION;  Surgeon: Raynelle Bring, MD;  [facility];  Service: Urology;  Laterality: Left;  . LUMBAR LAMINECTOMY/DECOMPRESSION MICRODISCECTOMY Left 03/07/2013   Procedure: LUMBAR FOUR-FIVE EXTRAFORAMINAL LUMBAR LAMINECTOMY/DECOMPRESSION MICRODISCECTOMY 1 LEVEL;  Surgeon: Charlie Pitter, MD;  Location: [facility];  Service: Neurosurgery;  Laterality: Left;  Left lumbar four-five extraforaminal microdiskectomy  . LUMBAR LAMINECTOMY/DECOMPRESSION MICRODISCECTOMY Left 01/15/2018   Procedure: Microdiscectomy - left - Lumbar Four -Lumbar Five extraforaminal;  Surgeon: Earnie Larsson, MD;  Location: [facility];  Service: Neurosurgery;  Laterality: Left;  Microdiscectomy - left - Lumbar Four -Lumbar Five extraforaminal  . PROSTATECTOMY    . TONSILLECTOMY      There were no vitals filed for this visit.  Subjective Assessment - 06/21/19 1321  Subjective  Having a little soreness in my chest and back-maybe it's the new exercises.    Patient Stated Goals  Getting up and down better, strengthening, and walking better.    Currently in Pain?  Yes    Pain Score  6     Pain Location  Back    Pain Orientation  Mid;Lower    Pain Descriptors / Indicators  Aching;Tightness    Pain Type  Chronic pain    Pain Onset  More than a month ago    Pain Frequency  Intermittent    Aggravating Factors   maybe some of the new exercises?    Pain Relieving Factors  tylenol PM                        [facility] Adult PT Treatment/Exercise - 06/22/19 0001      Transfers   Transfers  Sit to Stand;Stand to Sit    Sit to Stand  5: Supervision;With upper extremity assist;With armrests;From bed;From chair/3-in-1    Sit to Stand Details  Verbal cues for sequencing;Verbal cues for technique    Five time sit to stand comments   38.84   UE support, no posterior lean   Stand to Sit  With upper extremity assist;To chair/3-in-1;To bed;5: Supervision    Number of Reps  2 sets;Other reps (comment)   5 reps   Comments  No posterior lean noted with sit>stand today      Knee/Hip Exercises: Stretches   Other Knee/Hip Stretches  Runner's stretch, at counter, 2 reps x 30 seconds each   Review of HEP, pt return demo understanding       [facility]) - 06/21/19 1331    PWR! exercises  Moves in sitting;Moves in standing    PWR! Up  x 10 rep    PWR! Rock  x 10 reps each side with 1 UE support    PWR Step  x 10 reps each side    Comments Standing  Pt holding support rail beside treadmill    PWR! Up  x 10    PWR! Rock  x 10    PWR! Twist  x 10    PWR! Step  x 10    Comments-sitting  review of HEP, pt is continuing to perform correctly       Balance Exercises - 06/21/19 1344      Balance Exercises: Standing   Stepping Strategy  Anterior;Posterior;10 reps;UE support    Heel Raises Limitations  x 10 reps   review of HEP   Toe Raise Limitations  10 reps   review of HEP     Gait x 115 ft x 2 with cane, with cues for consistent foot clearance, step length, heelstrike  PT Education - 06/22/19 1426    Education Details  Additions to HEP-see instructions    Person(s) Educated  Patient    Methods  Explanation;Demonstration;Verbal cues;Handout    Comprehension  Verbalized understanding;Returned demonstration;Verbal cues required;Need further instruction       PT Short Term Goals - 05/24/19 1456      PT SHORT TERM GOAL #1   Title  Pt will perform HEP independently  to address Parkinson's specific deficits.  TARGET 06/24/2019    Time  4    Period  Weeks    Status  New    Target Date  06/24/19      PT SHORT TERM GOAL #2   Title  Pt will improve  5x sit<>stand to less than or equal to 25 seconds for improved functional lower extremity strength and transfer effiiency.    Time  4    Period  Weeks    Status  New    Target Date  06/24/19      PT SHORT TERM GOAL #3   Title  Pt will improve TUG score to less than or equal to 20 seconds for decreased fall risk.    Time  4    Period  Weeks    Status  New    Target Date  06/24/19      PT SHORT TERM GOAL #4   Title  Pt will improve gait velocity to at least 2 ft/sec for improved gait efficiency and safety.    Time  4    Period  Weeks    Status  New    Target Date  06/24/19      PT SHORT TERM GOAL #5   Title  Pt will verbalize understanding of fall prevention in home environment.    Time  8    Period  Weeks    Status  New    Target Date  06/24/19        PT Long Term Goals - 05/24/19 1638      PT LONG TERM GOAL #1   Title  Pt will be independent with progression of HEP and transition to optimal ongoing fitness program upon d/c from PT.  TARGET 07/22/2019    Time  8    Period  Weeks    Status  New    Target Date  07/22/19      PT LONG TERM GOAL #2   Title  Pt will improve 5x sit<>stand score to less than or equal to 20 seconds for improved functional lower extremity strength and improved transfer efficiency.    Time  8    Period  Weeks    Target Date  07/22/19      PT LONG TERM GOAL #3   Title  Pt will improve TUG score to less than or equal to 15 seconds for decreased fall risk.    Time  8    Period  Weeks    Status  New    Target Date  07/22/19      PT LONG TERM GOAL #4   Title  Pt will improve DGI score to at least 19/24 for decreased fall risk.    Time  8    Period  Weeks    Status  New    Target Date  07/22/19      PT LONG TERM GOAL #5   Title  Pt will improve gait  velocity to at least 2.3 ft/sec for improved gait efficiency and safety.    Time  8    Period  Weeks    Status  New    Target Date  07/22/19            Plan - 06/22/19 1427    Clinical Impression Statement  Skilled PT session addressing posture, balance, gait, including seated PWR! Moves review and standing PWR! Moves added to HEP.  Pt uses UE support for steadiness during these exercises.  He is progressing well with therapy towards goals, but conitnues to need cues at times to maintain improved step length.    Personal Factors and Comorbidities  Comorbidity 3+    Comorbidities  microdiscectomy L4-5 12/2017, vertigo, low BP, A-fib, AAA, OA, CABG 2071mild increased tone  LUE    Examination-Activity Limitations  Locomotion Level;Transfers    Examination-Participation Restrictions  Community Activity    Stability/Clinical Decision Making  Evolving/Moderate complexity    Rehab Potential  Good    PT Frequency  2x / week    PT Duration  8 weeks   plus eval   PT Treatment/Interventions  ADLs/Self Care Home Management;Gait training;Stair training;Functional mobility training;Therapeutic activities;Therapeutic exercise;Balance training;Neuromuscular re-education;Patient/family education    PT Next Visit Plan  Check standing PWR! Moves; check STGs and discuss future POC (pt only scheduled through next week)    Consulted and Agree with Plan of Care  Patient       Patient will benefit from skilled therapeutic intervention in order to improve the following deficits and impairments:  Abnormal gait, Difficulty walking, Decreased balance, Decreased mobility, Decreased strength  Visit Diagnosis: Unsteadiness on feet  Other abnormalities of gait and mobility  Muscle weakness (generalized)     Problem List Patient Active Problem List   Diagnosis Date Noted  . Dyslipidemia (high LDL; low HDL) 07/15/2018  . Long term current use of anticoagulant 07/15/2018  . Lumbar disc herniation  01/15/2018  . Parkinson's disease ([place]) 08/01/2017  . Coronary artery disease involving native coronary artery of native heart without angina pectoris 07/10/2017  . Acute renal insufficiency 08/22/2014  . AAA (abdominal aortic aneurysm) without rupture ([place]) 08/22/2014  . Diverticulosis of colon without hemorrhage 08/22/2014  . Right ureteral stone 08/21/2014  . Hyperlipidemia 05/31/2013  . Essential hypertension 03/03/2013  . Leukocytosis, unspecified 03/03/2013  . 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 Chronic a-fib ([place]) 03/02/2013    Algie Cales W. 06/22/2019, 2:30 PM  Frazier Butt., PT   [facility] [address] [place], [name], [address] Phone: [phone]   Fax:  [phone]  Name: REGENALD MATURO MRN: [id number] Date of Birth: 1933/08/18

## 2019-06-24 ENCOUNTER — Ambulatory Visit: Payer: Medicare Other | Admitting: Physical Therapy

## 2019-06-24 ENCOUNTER — Other Ambulatory Visit: Payer: Self-pay

## 2019-06-24 DIAGNOSIS — R2681 Unsteadiness on feet: Secondary | ICD-10-CM | POA: Diagnosis not present

## 2019-06-24 DIAGNOSIS — R2689 Other abnormalities of gait and mobility: Secondary | ICD-10-CM

## 2019-06-24 DIAGNOSIS — M6281 Muscle weakness (generalized): Secondary | ICD-10-CM

## 2019-06-24 NOTE — Patient Instructions (Addendum)

## 2019-06-25 NOTE — Therapy (Signed)
[facility] [address] [place], [place], [address] Phone: [phone]   Fax:  [phone]  Physical Therapy Treatment  Patient Details  Name: Adam Kane MRN: [id number] Date of Birth: 12/28/1932 Referring Provider (PT): Tat, Rebecca   Encounter Date: 06/24/2019  PT End of Session - 06/25/19 1734    Visit Number  7    Number of Visits  17    Date for PT Re-Evaluation  08/20/19    Authorization Type  [employer] Medicare    PT Start Time  1231    PT Stop Time  1314    PT Time Calculation (min)  43 min    Activity Tolerance  Patient tolerated treatment well    Behavior During Therapy  WFL for tasks assessed/performed       Past Medical History:  Diagnosis Date  . A-fib (HCC)   . Abdominal aortic aneurysm (AAA), 30-34 mm diameter (HCC)   . Arthritis   . CAD (coronary artery disease)   . Cancer (HCC)    prostate cancer 2009  . Chronic kidney disease    kidney stone  . Complication of anesthesia    if given anesthesia too quickly experiences nausea and vomiting   . Diverticulosis of colon without hemorrhage   . Dyslipidemia   . Dysrhythmia   . Early-onset Parkinson's disease (HCC)   . GERD (gastroesophageal reflux disease)   . Heart murmur    childhood  . History of kidney stones   . PONV (postoperative nausea and vomiting)   . S/P CABG (coronary artery bypass graft) 2001  . Shortness of breath    mild exertion due to afib  . Systemic hypertension   . Urinary leakage     Past Surgical History:  Procedure Laterality Date  . APPENDECTOMY    . CARDIAC CATHETERIZATION  02/20/94  . CATARACT EXTRACTION, BILATERAL    . CHOLECYSTECTOMY    . CORONARY ARTERY BYPASS GRAFT  2001   LIMA to LAD,SVG to PDA  . CORONARY ARTERY BYPASS GRAFT     2 vessel 2001  . CYSTOSCOPY WITH RETROGRADE PYELOGRAM, URETEROSCOPY AND STENT PLACEMENT Left 05/18/2014   Procedure: CYSTOSCOPY WITH RETROGRADE PYELOGRAM, URETEROSCOPY AND  STENT PLACEMENT;  Surgeon: Lester Borden, MD;  Location: [facility];  Service: Urology;  Laterality: Left;  . CYSTOSCOPY WITH RETROGRADE PYELOGRAM, URETEROSCOPY AND STENT PLACEMENT Right 08/21/2014   Procedure: CYSTOSCOPY WITH RETROGRADE PYELOGRAM, URETEROSCOPY AND STENT PLACEMENT WITH STONE EXTRACTION basket;  Surgeon: John J Wrenn, MD;  Location: [facility];  Service: Urology;  Laterality: Right;  . HERNIA REPAIR    . HOLMIUM LASER APPLICATION Left 05/18/2014   Procedure: HOLMIUM LASER APPLICATION;  Surgeon: Lester Borden, MD;  Location: [facility];  Service: Urology;  Laterality: Left;  . LUMBAR LAMINECTOMY/DECOMPRESSION MICRODISCECTOMY Left 03/07/2013   Procedure: LUMBAR FOUR-FIVE EXTRAFORAMINAL LUMBAR LAMINECTOMY/DECOMPRESSION MICRODISCECTOMY 1 LEVEL;  Surgeon: Henry A Pool, MD;  Location: [facility];  Service: Neurosurgery;  Laterality: Left;  Left lumbar four-five extraforaminal microdiskectomy  . LUMBAR LAMINECTOMY/DECOMPRESSION MICRODISCECTOMY Left 01/15/2018   Procedure: Microdiscectomy - left - Lumbar Four -Lumbar Five extraforaminal;  Surgeon: Pool, Henry, MD;  Location: [facility];  Service: Neurosurgery;  Laterality: Left;  Microdiscectomy - left - Lumbar Four -Lumbar Five extraforaminal  . PROSTATECTOMY    . TONSILLECTOMY      There were no vitals filed for this visit.  Subjective Assessment - 06/24/19 1233    Subjective  No changes since last visit-soreness in chest and   back is somewhat muscular, and feels better, especially once I move around.  Feel like therapy has helped all my movements; feel I'll be ready for d/c next week.    Patient Stated Goals  Getting up and down better, strengthening, and walking better.    Currently in Pain?  Yes    Pain Score  3     Pain Location  Back    Pain Orientation  Mid;Lower    Pain Descriptors / Indicators  Aching;Tightness    Pain Type  Chronic pain    Pain Onset  More than a month ago    Pain Frequency  Intermittent    Aggravating Factors   unsure     Pain Relieving Factors  exercises, stretching                       [facility] Adult PT Treatment/Exercise - 06/24/19 1234      Transfers   Transfers  Sit to Stand;Stand to Sit    Sit to Stand  5: Supervision;With upper extremity assist;With armrests;From bed;From chair/3-in-1    Five time sit to stand comments   26    Stand to Sit  With upper extremity assist;To chair/3-in-1;To bed;5: Supervision      Ambulation/Gait   Ambulation/Gait  Yes    Ambulation/Gait Assistance  6: Modified independent (Device/Increase time)    Ambulation Distance (Feet)  400 Feet   100   Assistive device  Straight cane    Gait Pattern  Step-through pattern;Decreased arm swing - left;Decreased trunk rotation;Narrow base of support;Trunk flexed;Decreased step length - right;Decreased step length - left;Decreased dorsiflexion - right;Decreased dorsiflexion - left;Poor foot clearance - left;Poor foot clearance - right    Ambulation Surface  Indoor;Level    Gait velocity  2.13 ft/sec    Stairs  Yes    Stairs Assistance  5: Supervision    Stair Management Technique  Two rails;Alternating pattern;Forwards    Number of Stairs  4   x 2   Height of Stairs  6    Gait Comments  Longer distance gait with conversation tasks, with pt noted to slow pace and have decreased step length with conversation.  Pt also tends to have shuffling pattern upon coming up to seat to prepare to sit.  PT provides cues to increase step length.      Standardized Balance Assessment   Standardized Balance Assessment  Timed Up and Go Test      Timed Up and Go Test   TUG  Normal TUG    Normal TUG (seconds)  15.94      Self-Care   Self-Care  Other Self-Care Comments    Other Self-Care Comments   Fall prevention education provided;  discussed progress towards goals, POC.  Pt is to return to part-time work week of 11/2 and he feels he is ready for PT d/c next week.        PWR ([facility]) - 06/25/19 1724    PWR! exercises  Moves in  standing    PWR! Up  x 10 reps    PWR! Rock  x 10 reps each side    PWR! Twist  Performed at corner, reaching across body to wall, x 10 reps each side    PWR Step  x 10 reps each side at counter, with 1 UE support.  PT provides cues for increased step height and foot clearance.    Comments  REview of HEP from last visit; pt return   demo understanding with minimal cues for technique and increased intensity of movement       Balance Exercises - 06/24/19 1302      Balance Exercises: Standing   SLS with Vectors  Solid surface;Upper extremity assist 2;Other reps (comment)   10 reps alternating step taps to 6, 12" steps   Step Ups  Forward;6 inch;UE support 2   step up/up, down/down; the single leg step ups x 10 each   Retro Gait  Upper extremity support;5 reps    Sidestepping  Upper extremity support;3 reps   Cues for increased foot clearance   Marching Limitations  Marching in place 2 sets x 10 reps, with cues for widened BOS.  Progress to forward marching with turns at counter, 4 reps with 1 UE support        PT Education - 06/25/19 1734    Education Details  Fall prevention education; progress towards goals, POC    Person(s) Educated  Patient    Methods  Explanation   Handout printed, but printer jammed; will give copy next viist   Comprehension  Verbalized understanding       PT Short Term Goals - 06/24/19 1236      PT SHORT TERM GOAL #1   Title  Pt will perform HEP independently to address Parkinson's specific deficits.  TARGET 06/24/2019    Time  4    Period  Weeks    Status  Achieved    Target Date  06/24/19      PT SHORT TERM GOAL #2   Title  Pt will improve 5x sit<>stand to less than or equal to 25 seconds for improved functional lower extremity strength and transfer effiiency.    Baseline  26 sec 06/24/2019    Time  4    Period  Weeks    Status  Partially Met    Target Date  06/24/19      PT SHORT TERM GOAL #3   Title  Pt will improve TUG score to less than or  equal to 20 seconds for decreased fall risk.    Baseline  15.94 sec 06/24/2019    Time  4    Period  Weeks    Status  Achieved    Target Date  06/24/19      PT SHORT TERM GOAL #4   Title  Pt will improve gait velocity to at least 2 ft/sec for improved gait efficiency and safety.    Baseline  15.38 sec =2.13 ft/sec    Time  4    Period  Weeks    Status  Achieved    Target Date  06/24/19      PT SHORT TERM GOAL #5   Title  Pt will verbalize understanding of fall prevention in home environment.    Time  8    Period  Weeks    Status  Achieved    Target Date  06/24/19        PT Long Term Goals - 05/24/19 1638      PT LONG TERM GOAL #1   Title  Pt will be independent with progression of HEP and transition to optimal ongoing fitness program upon d/c from PT.  TARGET 07/22/2019    Time  8    Period  Weeks    Status  New    Target Date  07/22/19      PT LONG TERM GOAL #2   Title  Pt will improve 5x sit<>stand score to  less than or equal to 20 seconds for improved functional lower extremity strength and improved transfer efficiency.    Time  8    Period  Weeks    Target Date  07/22/19      PT LONG TERM GOAL #3   Title  Pt will improve TUG score to less than or equal to 15 seconds for decreased fall risk.    Time  8    Period  Weeks    Status  New    Target Date  07/22/19      PT LONG TERM GOAL #4   Title  Pt will improve DGI score to at least 19/24 for decreased fall risk.    Time  8    Period  Weeks    Status  New    Target Date  07/22/19      PT LONG TERM GOAL #5   Title  Pt will improve gait velocity to at least 2.3 ft/sec for improved gait efficiency and safety.    Time  8    Period  Weeks    Status  New    Target Date  07/22/19            Plan - 06/25/19 1735    Clinical Impression Statement  Assessed STGs this visit, with pt meeting 4 of 5 goals.  STG 1, 3, 4, 5 met with pt improving TUG and gait velocity measures.  STG 2 partially met, with pt  improving 5x sit<>stand score, just not to goal level.  Pt has progressed with overall functional mobility, continues to be at fall risk per TUG score.  Pt also noted to have slowed pace of gait and decreased foot clearance with dual tasking (conversation) with gait.  Pt will continue to benefit from skilled PT to address balance, functional strength, and gait for overall imrpoved functional mobility and decreased fall risk.    Personal Factors and Comorbidities  Comorbidity 3+    Comorbidities  microdiscectomy L4-5 12/2017, vertigo, low BP, A-fib, AAA, OA, CABG 2001mild increased tone LUE    Examination-Activity Limitations  Locomotion Level;Transfers    Examination-Participation Restrictions  Community Activity    Stability/Clinical Decision Making  Evolving/Moderate complexity    Rehab Potential  Good    PT Frequency  2x / week    PT Duration  8 weeks   plus eval   PT Treatment/Interventions  ADLs/Self Care Home Management;Gait training;Stair training;Functional mobility training;Therapeutic activities;Therapeutic exercise;Balance training;Neuromuscular re-education;Patient/family education    PT Next Visit Plan  Work towards LTGs, work on dual tasking with gait; pt plans for d/c next week, as he is returning to part-time work the following week.    Consulted and Agree with Plan of Care  Patient       Patient will benefit from skilled therapeutic intervention in order to improve the following deficits and impairments:  Abnormal gait, Difficulty walking, Decreased balance, Decreased mobility, Decreased strength  Visit Diagnosis: Unsteadiness on feet  Other abnormalities of gait and mobility  Muscle weakness (generalized)     Problem List Patient Active Problem List   Diagnosis Date Noted  . Dyslipidemia (high LDL; low HDL) 07/15/2018  . Long term current use of anticoagulant 07/15/2018  . Lumbar disc herniation 01/15/2018  . Parkinson's disease (HCC) 08/01/2017  . Coronary artery  disease involving native coronary artery of native heart without angina pectoris 07/10/2017  . Acute renal insufficiency 08/22/2014  . AAA (abdominal aortic aneurysm) without rupture (HCC) 08/22/2014  .   Diverticulosis of colon without hemorrhage 08/22/2014  . Right ureteral stone 08/21/2014  . Hyperlipidemia 05/31/2013  . Essential hypertension 03/03/2013  . Leukocytosis, unspecified 03/03/2013  . 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 Chronic a-fib ([place]) 03/02/2013    Dannetta Lekas W. 06/25/2019, 5:39 PM  Frazier Butt., PT   [facility] [address] [place], [name], [address] Phone: [phone]   Fax:  [phone]  Name: EVYN PUTZIER MRN: [id number] Date of Birth: Aug 13, 1933

## 2019-06-28 ENCOUNTER — Other Ambulatory Visit: Payer: Self-pay

## 2019-06-28 ENCOUNTER — Ambulatory Visit: Payer: Medicare Other | Admitting: Physical Therapy

## 2019-06-28 DIAGNOSIS — M6281 Muscle weakness (generalized): Secondary | ICD-10-CM

## 2019-06-28 DIAGNOSIS — R29818 Other symptoms and signs involving the nervous system: Secondary | ICD-10-CM

## 2019-06-28 DIAGNOSIS — R2681 Unsteadiness on feet: Secondary | ICD-10-CM

## 2019-06-28 DIAGNOSIS — R2689 Other abnormalities of gait and mobility: Secondary | ICD-10-CM

## 2019-06-28 NOTE — Therapy (Signed)
[facility] [address] [place], [name], [address] Phone: [phone]   Fax:  [phone]  Physical Therapy Treatment  Patient Details  Name: Adam Kane MRN: [id number] Date of Birth: 12-Feb-1933 Referring Provider (PT): Tat, Wells Guiles   Encounter Date: 06/28/2019  PT End of Session - 06/28/19 1516    Visit Number  8    Number of Visits  17    Date for PT Re-Evaluation  08/20/19    Authorization Type  [employer] Medicare    PT Start Time  9357    PT Stop Time  1443    PT Time Calculation (min)  40 min    Activity Tolerance  Patient tolerated treatment well    Behavior During Therapy  [facility] for tasks assessed/performed       Past Medical History:  Diagnosis Date  . A-fib ([place])   . Abdominal aortic aneurysm (AAA), 30-34 mm diameter (HCC)   . Arthritis   . CAD (coronary artery disease)   . Cancer [facility])    prostate cancer 2009  . Chronic kidney disease    kidney stone  . Complication of anesthesia    if given anesthesia too quickly experiences nausea and vomiting   . Diverticulosis of colon without hemorrhage   . Dyslipidemia   . Dysrhythmia   . Early-onset Parkinson's disease ([place])   . GERD (gastroesophageal reflux disease)   . Heart murmur    childhood  . History of kidney stones   . PONV (postoperative nausea and vomiting)   . S/P CABG (coronary artery bypass graft) 2001  . Shortness of breath    mild exertion due to afib  . Systemic hypertension   . Urinary leakage     Past Surgical History:  Procedure Laterality Date  . APPENDECTOMY    . CARDIAC CATHETERIZATION  02/20/94  . CATARACT EXTRACTION, BILATERAL    . CHOLECYSTECTOMY    . CORONARY ARTERY BYPASS GRAFT  2001   LIMA to LAD,SVG to PDA  . CORONARY ARTERY BYPASS GRAFT     2 vessel 2001  . CYSTOSCOPY WITH RETROGRADE PYELOGRAM, URETEROSCOPY AND STENT PLACEMENT Left 05/18/2014   Procedure: CYSTOSCOPY WITH RETROGRADE PYELOGRAM, URETEROSCOPY AND  STENT PLACEMENT;  Surgeon: Raynelle Bring, MD;  [facility];  Service: Urology;  Laterality: Left;  . CYSTOSCOPY WITH RETROGRADE PYELOGRAM, URETEROSCOPY AND STENT PLACEMENT Right 08/21/2014   Procedure: CYSTOSCOPY WITH RETROGRADE PYELOGRAM, URETEROSCOPY AND STENT PLACEMENT WITH STONE EXTRACTION basket;  Surgeon: Malka So, MD;  [facility];  Service: Urology;  Laterality: Right;  . HERNIA REPAIR    . HOLMIUM LASER APPLICATION Left 0/17/7939   Procedure: HOLMIUM LASER APPLICATION;  Surgeon: Raynelle Bring, MD;  [facility];  Service: Urology;  Laterality: Left;  . LUMBAR LAMINECTOMY/DECOMPRESSION MICRODISCECTOMY Left 03/07/2013   Procedure: LUMBAR FOUR-FIVE EXTRAFORAMINAL LUMBAR LAMINECTOMY/DECOMPRESSION MICRODISCECTOMY 1 LEVEL;  Surgeon: Charlie Pitter, MD;  Location: [facility];  Service: Neurosurgery;  Laterality: Left;  Left lumbar four-five extraforaminal microdiskectomy  . LUMBAR LAMINECTOMY/DECOMPRESSION MICRODISCECTOMY Left 01/15/2018   Procedure: Microdiscectomy - left - Lumbar Four -Lumbar Five extraforaminal;  Surgeon: Earnie Larsson, MD;  Location: [facility];  Service: Neurosurgery;  Laterality: Left;  Microdiscectomy - left - Lumbar Four -Lumbar Five extraforaminal  . PROSTATECTOMY    . TONSILLECTOMY      There were no vitals filed for this visit.  Subjective Assessment - 06/28/19 1406    Subjective  Want to focus on back and chest-think it's  getting a little better    Patient Stated Goals  Getting up and down better, strengthening, and walking better.    Currently in Pain?  Yes    Pain Score  2     Pain Location  Back    Pain Orientation  Mid;Lower    Pain Descriptors / Indicators  Aching;Tightness    Pain Type  Chronic pain    Pain Onset  More than a month ago    Pain Frequency  Intermittent    Aggravating Factors   rolling    Pain Relieving Factors  exercises stretching                       [facility] Adult PT Treatment/Exercise - 06/28/19 0001       Transfers   Transfers  Sit to Stand;Stand to Sit    Sit to Stand  5: Supervision;With upper extremity assist;With armrests;From bed    Stand to Sit  With upper extremity assist;To bed;5: Supervision    Number of Reps  10 reps;1 set    Comments  No posterior lean noted with sit>stand today      Ambulation/Gait   Ambulation/Gait  Yes    Ambulation/Gait Assistance  6: Modified independent (Device/Increase time)    Ambulation Distance (Feet)  600 Feet    Assistive device  Straight cane    Gait Pattern  Step-through pattern;Decreased arm swing - left;Decreased trunk rotation;Trunk flexed;Decreased step length - right;Decreased step length - left;Decreased dorsiflexion - right;Decreased dorsiflexion - left;Poor foot clearance - left;Poor foot clearance - right    Ambulation Surface  Level;Indoor    Gait Comments  Gait with head turns, nods, environmental scanning tasks.  Pt able to continue pace of gait, with occasional cues for increased foot clearance and step length.  Discussed stopping, resetting for BIG step length if he begins to shuffle.  Discussed continuing BIG effort with foot clearance until he fully turns to sit.      Posture/Postural Control   Posture Comments  Pt c/o pain in R mid back area-not tender to palpation, just sore with attempts at rolling.      Exercises   Exercises  Lumbar      Lumbar Exercises: Stretches   Single Knee to Chest Stretch  Right;Left;1 rep;10 seconds    Double Knee to Chest Stretch  3 reps;10 seconds    Lower Trunk Rotation  3 reps   2 sets, begins with rocking side to side, arms out to side   Lower Trunk Rotation Limitations  Cues for correct technique to be able to feel stretch into mid-back.    Pelvic Tilt  5 reps   2 sets, with initial facilitation   Other Lumbar Stretch Exercise  Worked on rolling with modified PWR! Moves, knees bent, PWR! Twist supine, to clap hands x 5 reps, to improve lumbar flexibility.  Pt c/o increased pain in R mid-back with  roll to R and roll back to center.  Cues to initiate through lower extremity less strain through mid-back      Lumbar Exercises: Supine   Other Supine Lumbar Exercises  supine PWR! Up, scapular squeezes x 10 reps, initial tactile cues for postural strengthening      Knee/Hip Exercises: Aerobic   Nustep  Seated NuStep, 4 extremities Level, 3 x 5 minutes, for improved flexibility and strengthening.             PT Education - 06/28/19 1515    Education  Details  Added lumbar exercises to HEP to address bed mobility and trunk flexibility    Person(s) Educated  Patient    Methods  Explanation;Demonstration;Handout    Comprehension  Verbalized understanding;Returned demonstration;Verbal cues required       PT Short Term Goals - 06/24/19 1236      PT SHORT TERM GOAL #1   Title  Pt will perform HEP independently to address Parkinson's specific deficits.  TARGET 06/24/2019    Time  4    Period  Weeks    Status  Achieved    Target Date  06/24/19      PT SHORT TERM GOAL #2   Title  Pt will improve 5x sit<>stand to less than or equal to 25 seconds for improved functional lower extremity strength and transfer effiiency.    Baseline  26 sec 06/24/2019    Time  4    Period  Weeks    Status  Partially Met    Target Date  06/24/19      PT SHORT TERM GOAL #3   Title  Pt will improve TUG score to less than or equal to 20 seconds for decreased fall risk.    Baseline  15.94 sec 06/24/2019    Time  4    Period  Weeks    Status  Achieved    Target Date  06/24/19      PT SHORT TERM GOAL #4   Title  Pt will improve gait velocity to at least 2 ft/sec for improved gait efficiency and safety.    Baseline  15.38 sec =2.13 ft/sec    Time  4    Period  Weeks    Status  Achieved    Target Date  06/24/19      PT SHORT TERM GOAL #5   Title  Pt will verbalize understanding of fall prevention in home environment.    Time  8    Period  Weeks    Status  Achieved    Target Date  06/24/19         PT Long Term Goals - 05/24/19 1638      PT LONG TERM GOAL #1   Title  Pt will be independent with progression of HEP and transition to optimal ongoing fitness program upon d/c from PT.  TARGET 07/22/2019    Time  8    Period  Weeks    Status  New    Target Date  07/22/19      PT LONG TERM GOAL #2   Title  Pt will improve 5x sit<>stand score to less than or equal to 20 seconds for improved functional lower extremity strength and improved transfer efficiency.    Time  8    Period  Weeks    Target Date  07/22/19      PT LONG TERM GOAL #3   Title  Pt will improve TUG score to less than or equal to 15 seconds for decreased fall risk.    Time  8    Period  Weeks    Status  New    Target Date  07/22/19      PT LONG TERM GOAL #4   Title  Pt will improve DGI score to at least 19/24 for decreased fall risk.    Time  8    Period  Weeks    Status  New    Target Date  07/22/19      PT LONG TERM GOAL #5  Title  Pt will improve gait velocity to at least 2.3 ft/sec for improved gait efficiency and safety.    Time  8    Period  Weeks    Status  New    Target Date  07/22/19            Plan - 06/28/19 1517    Clinical Impression Statement  Pt wanted to work on bed mobility, rolling, attempt to address R mid-back pain.  Pt is not tender to palpation, but feels pain "deep" in R mid-back.  This is not new and only comes on with rolling to R and return to midline.  Worked on lumbar flexibility and scapular exercises, as well as techniques to ease rolling.  Pt continues to have the mid-back pain throughout.  Plan for checking goals, for possible d/c next visit.    Personal Factors and Comorbidities  Comorbidity 3+    Comorbidities  microdiscectomy L4-5 12/2017, vertigo, low BP, A-fib, AAA, OA, CABG 2080mld increased tone LUE    Examination-Activity Limitations  Locomotion Level;Transfers    Examination-Participation Restrictions  Community Activity    Stability/Clinical Decision  Making  Evolving/Moderate complexity    Rehab Potential  Good    PT Frequency  2x / week    PT Duration  8 weeks   plus eval   PT Treatment/Interventions  ADLs/Self Care Home Management;Gait training;Stair training;Functional mobility training;Therapeutic activities;Therapeutic exercise;Balance training;Neuromuscular re-education;Patient/family education    PT Next Visit Plan  Check LTGs and plan for d/c, as pt plans to return to part-time work.    Consulted and Agree with Plan of Care  Patient       Patient will benefit from skilled therapeutic intervention in order to improve the following deficits and impairments:  Abnormal gait, Difficulty walking, Decreased balance, Decreased mobility, Decreased strength  Visit Diagnosis: Muscle weakness (generalized)  Other symptoms and signs involving the nervous system  Unsteadiness on feet  Other abnormalities of gait and mobility     Problem List Patient Active Problem List   Diagnosis Date Noted  . Dyslipidemia (high LDL; low HDL) 07/15/2018  . Long term current use of anticoagulant 07/15/2018  . Lumbar disc herniation 01/15/2018  . [place]'s disease [place] 08/01/2017  . Coronary artery disease involving native coronary artery of native heart without angina pectoris 07/10/2017  . Acute renal insufficiency 08/22/2014  . AAA (abdominal aortic aneurysm) without rupture ([place] 08/22/2014  . Diverticulosis of colon without hemorrhage 08/22/2014  . Right ureteral stone 08/21/2014  . Hyperlipidemia 05/31/2013  . Essential hypertension 03/03/2013  . Leukocytosis, unspecified 03/03/2013  . 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 [place]fib ([place] 03/02/2013    Jermane Brayboy W. 06/28/2019, 3:24 PM  Chaunta Bejarano [facility] PT 06/28/19 3:24 PM Phone: [phone]: [phone] [place]  [address]Phone: [phone]  Fax:  [phone] Name: Adam SHERKMRN: [id number] of Birth: [birth year]

## 2019-06-28 NOTE — Patient Instructions (Signed)
Basic Back packet exercises:  1)  Supine pelvic tilts x 10 reps  2)  Lumbar trunk rotation, with arms out to side, 5 reps with initiating through rocking, 10-15 seconds  3)Double knee to chest, 3 reps, 10 seconds

## 2019-07-01 ENCOUNTER — Other Ambulatory Visit: Payer: Self-pay

## 2019-07-01 ENCOUNTER — Ambulatory Visit: Payer: Medicare Other | Admitting: Physical Therapy

## 2019-07-01 DIAGNOSIS — M6281 Muscle weakness (generalized): Secondary | ICD-10-CM

## 2019-07-01 DIAGNOSIS — R2681 Unsteadiness on feet: Secondary | ICD-10-CM

## 2019-07-01 DIAGNOSIS — R2689 Other abnormalities of gait and mobility: Secondary | ICD-10-CM

## 2019-07-01 NOTE — Therapy (Signed)
Adam Kane [address] [place], [name], [address] Phone: [phone]   Fax:  [phone]  Physical Therapy Treatment/Discharge Summary  Patient Details  Name: Adam Kane MRN: [id number] Date of Birth: 11/19/1932 Referring Provider (PT): Adam Kane Date: 07/01/2019  PT End of Session - 07/01/19 1830    Visit Number  9    Number of Visits  17    Date for PT Re-Evaluation  08/20/19    Authorization Type  [employer] Medicare    PT Start Time  1321    PT Stop Time  1401    PT Time Calculation (min)  40 min    Activity Tolerance  Patient tolerated treatment well    Behavior During Therapy  [facility] for tasks assessed/performed       Past Medical History:  Diagnosis Date  . A-fib ([place])   . Abdominal aortic aneurysm (AAA), 30-34 mm diameter (HCC)   . Arthritis   . CAD (coronary artery disease)   . Cancer [facility])    prostate cancer 2009  . Chronic kidney disease    kidney stone  . Complication of anesthesia    if given anesthesia too quickly experiences nausea and vomiting   . Diverticulosis of colon without hemorrhage   . Dyslipidemia   . Dysrhythmia   . Early-onset Parkinson's disease ([place])   . GERD (gastroesophageal reflux disease)   . Heart murmur    childhood  . History of kidney stones   . PONV (postoperative nausea and vomiting)   . S/P CABG (coronary artery bypass graft) 2001  . Shortness of breath    mild exertion due to afib  . Systemic hypertension   . Urinary leakage     Past Surgical History:  Procedure Laterality Date  . APPENDECTOMY    . CARDIAC CATHETERIZATION  02/20/94  . CATARACT EXTRACTION, BILATERAL    . CHOLECYSTECTOMY    . CORONARY ARTERY BYPASS GRAFT  2001   LIMA to LAD,SVG to PDA  . CORONARY ARTERY BYPASS GRAFT     2 vessel 2001  . CYSTOSCOPY WITH RETROGRADE PYELOGRAM, URETEROSCOPY AND STENT PLACEMENT Left 05/18/2014   Procedure: CYSTOSCOPY WITH RETROGRADE PYELOGRAM,  URETEROSCOPY AND STENT PLACEMENT;  Surgeon: Raynelle Bring, MD;  Location: [facility];  Service: Urology;  Laterality: Left;  . CYSTOSCOPY WITH RETROGRADE PYELOGRAM, URETEROSCOPY AND STENT PLACEMENT Right 08/21/2014   Procedure: CYSTOSCOPY WITH RETROGRADE PYELOGRAM, URETEROSCOPY AND STENT PLACEMENT WITH STONE EXTRACTION basket;  Surgeon: Malka So, MD;  Location: [facility];  Service: Urology;  Laterality: Right;  . HERNIA REPAIR    . HOLMIUM LASER APPLICATION Left 8/78/6767   Procedure: HOLMIUM LASER APPLICATION;  Surgeon: Raynelle Bring, MD;  Location: [facility];  Service: Urology;  Laterality: Left;  . LUMBAR LAMINECTOMY/DECOMPRESSION MICRODISCECTOMY Left 03/07/2013   Procedure: LUMBAR FOUR-FIVE EXTRAFORAMINAL LUMBAR LAMINECTOMY/DECOMPRESSION MICRODISCECTOMY 1 LEVEL;  Surgeon: Charlie Pitter, MD;  Location: [facility];  Service: Neurosurgery;  Laterality: Left;  Left lumbar four-five extraforaminal microdiskectomy  . LUMBAR LAMINECTOMY/DECOMPRESSION MICRODISCECTOMY Left 01/15/2018   Procedure: Microdiscectomy - left - Lumbar Four -Lumbar Five extraforaminal;  Surgeon: Earnie Larsson, MD;  Location: [place];  Service: Neurosurgery;  Laterality: Left;  Microdiscectomy - left - Lumbar Four -Lumbar Five extraforaminal  . PROSTATECTOMY    . TONSILLECTOMY      There were no vitals filed for this visit.  Subjective Assessment - 07/01/19 1324    Subjective  Think things are going pretty well.  I'd like to talk to daughter/doctor before deciding about return visit.    Patient Stated Goals  Getting up and down better, strengthening, and walking better.    Currently in Pain?  Yes    Pain Score  2     Pain Location  Back    Pain Orientation  Mid;Lower    Pain Descriptors / Indicators  Aching;Tightness    Pain Type  Chronic pain    Pain Onset  More than a month ago    Pain Frequency  Intermittent    Aggravating Factors   rolling    Pain Relieving Factors  exercises, stretching                        [facility] Adult PT Treatment/Exercise - 07/01/19 1333      Transfers   Transfers  Sit to Stand;Stand to Sit    Sit to Stand  5: Supervision;With upper extremity assist;With armrests;From bed;From chair/3-in-1    Five time sit to stand comments   21.44   with UE support   Stand to Sit  With upper extremity assist;To bed;5: Supervision;Without upper extremity assist;To chair/3-in-1    Number of Reps  10 reps;2 sets   from mat, then from chair     Ambulation/Gait   Ambulation/Gait  Yes    Ambulation/Gait Assistance  6: Modified independent (Device/Increase time)    Ambulation Distance (Feet)  500 Feet   x 2 (indoors, then outdoors)   Assistive device  Straight cane    Gait Pattern  Step-through pattern;Decreased arm swing - left;Decreased trunk rotation;Trunk flexed;Decreased step length - right;Decreased step length - left;Decreased dorsiflexion - right;Decreased dorsiflexion - left;Poor foot clearance - left;Poor foot clearance - right    Ambulation Surface  Level;Indoor;Outdoor    Gait velocity  2.24 ft/sec    Stairs  Yes    Stairs Assistance  5: Supervision    Stair Management Technique  Two rails;Alternating pattern;Forwards    Number of Stairs  4    Height of Stairs  6    Gait Comments  Occasional cues for increased foot clearance/step length with gait today.      Standardized Balance Assessment   Standardized Balance Assessment  Dynamic Gait Index      Dynamic Gait Index   Level Surface  Mild Impairment    Change in Gait Speed  Mild Impairment    Gait with Horizontal Head Turns  Mild Impairment    Gait with Vertical Head Turns  Mild Impairment    Gait and Pivot Turn  Normal    Step Over Obstacle  Mild Impairment    Step Around Obstacles  Mild Impairment    Steps  Mild Impairment    Total Score  17      Timed Up and Go Test   TUG  Normal TUG    Normal TUG (seconds)  15.81      Self-Care   Self-Care  Other Self-Care Comments    Other  Self-Care Comments   Discussed progress towards goals, POC and plans for d/c this visit.  Discussed options for return PT eval/screen in 6-9 months; pt would like to discuss with daughter, doctor prior to scheduling appt for return.      Exercises   Exercises  Lumbar   REviewed HEP from last visit, wtih return demo understnading            PT Education - 07/01/19 [place]    Education Details  Progress towards goals, POC, plans for d/c this visit; benefit of return PT eval/screen in 6-9 months    Person(s) Educated  Patient    Methods  Explanation    Comprehension  Verbalized understanding       PT Short Term Goals - 06/24/19 1236      PT SHORT TERM GOAL #1   Title  Pt will perform HEP independently to address Parkinson's specific deficits.  TARGET 06/24/2019    Time  4    Period  Weeks    Status  Achieved    Target Date  06/24/19      PT SHORT TERM GOAL #2   Title  Pt will improve 5x sit<>stand to less than or equal to 25 seconds for improved functional lower extremity strength and transfer effiiency.    Baseline  26 sec 06/24/2019    Time  4    Period  Weeks    Status  Partially Met    Target Date  06/24/19      PT SHORT TERM GOAL #3   Title  Pt will improve TUG score to less than or equal to 20 seconds for decreased fall risk.    Baseline  15.94 sec 06/24/2019    Time  4    Period  Weeks    Status  Achieved    Target Date  06/24/19      PT SHORT TERM GOAL #4   Title  Pt will improve gait velocity to at least 2 ft/sec for improved gait efficiency and safety.    Baseline  15.38 sec =2.13 ft/sec    Time  4    Period  Weeks    Status  Achieved    Target Date  06/24/19      PT SHORT TERM GOAL #5   Title  Pt will verbalize understanding of fall prevention in home environment.    Time  8    Period  Weeks    Status  Achieved    Target Date  06/24/19        PT Long Term Goals - 07/01/19 1340      PT LONG TERM GOAL #1   Title  Pt will be independent with  progression of HEP and transition to optimal ongoing fitness program upon d/c from PT.  TARGET 07/22/2019    Time  8    Period  Weeks    Status  Achieved      PT LONG TERM GOAL #2   Title  Pt will improve 5x sit<>stand score to less than or equal to 20 seconds for improved functional lower extremity strength and improved transfer efficiency.    Baseline  21.44 sec    Time  8    Period  Weeks    Status  Partially Met      PT LONG TERM GOAL #3   Title  Pt will improve TUG score to less than or equal to 15 seconds for decreased fall risk.    Baseline  15.81    Time  8    Period  Weeks    Status  Partially Met      PT LONG TERM GOAL #4   Title  Pt will improve DGI score to at least 19/24 for decreased fall risk.    Time  8    Period  Weeks    Status  Partially Met      PT LONG TERM GOAL #5   Title  Pt will   improve gait velocity to at least 2.3 ft/sec for improved gait efficiency and safety.    Baseline  2.24 ft/sec    Time  8    Period  Weeks    Status  Partially Met            Plan - 07/01/19 1831    Clinical Impression Statement  Assessed LTGs this visit, with pt meeting LTG 1; LTG 2-5 partially met, with pt improving functional measures (even since STG check last week), just not to goal level.  Pt has demonstrated overall improvement in gait and balance measures during the course of therapy, continues to benefit from occasional cueing for large amplitude movement patterns.   Pt is appropriate for d/c at this time.    Personal Factors and Comorbidities  Comorbidity 3+    Comorbidities  microdiscectomy L4-5 12/2017, vertigo, low BP, A-fib, AAA, OA, CABG 2001mild increased tone LUE    Examination-Activity Limitations  Locomotion Level;Transfers    Examination-Participation Restrictions  Community Activity    Stability/Clinical Decision Making  Evolving/Moderate complexity    Rehab Potential  Good    PT Frequency  2x / week    PT Duration  8 weeks   plus eval   PT  Treatment/Interventions  ADLs/Self Care Home Management;Gait training;Stair training;Functional mobility training;Therapeutic activities;Therapeutic exercise;Balance training;Neuromuscular re-education;Patient/family education    PT Next Visit Plan  D/C this visit; recommend return PT eval or screen.  (Pt would like to discuss with daugther/MD prior to scheduling)    Consulted and Agree with Plan of Care  Patient       Patient will benefit from skilled therapeutic intervention in order to improve the following deficits and impairments:  Abnormal gait, Difficulty walking, Decreased balance, Decreased mobility, Decreased strength  Visit Diagnosis: Other abnormalities of gait and mobility  Unsteadiness on feet  Muscle weakness (generalized)     Problem List Patient Active Problem List   Diagnosis Date Noted  . Dyslipidemia (high LDL; low HDL) 07/15/2018  . Long term current use of anticoagulant 07/15/2018  . Lumbar disc herniation 01/15/2018  . Parkinson's disease (HCC) 08/01/2017  . Coronary artery disease involving native coronary artery of native heart without angina pectoris 07/10/2017  . Acute renal insufficiency 08/22/2014  . AAA (abdominal aortic aneurysm) without rupture (HCC) 08/22/2014  . Diverticulosis of colon without hemorrhage 08/22/2014  . Right ureteral stone 08/21/2014  . Hyperlipidemia 05/31/2013  . Essential hypertension 03/03/2013  . Leukocytosis, unspecified 03/03/2013  . 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 Chronic a-fib (HCC) 03/02/2013    , W. 07/01/2019, 6:34 PM  [facility] [address] [place], [place], [address] Phone: [phone]   Fax:  [phone]  Name: Makael J Holloman MRN: [id number] Date of Birth: 09/27/1932  PHYSICAL THERAPY DISCHARGE SUMMARY  Visits from Start of Care: 9  Current functional level related to goals /  functional outcomes: See goals above   Remaining deficits: Posture, gait, balance (improved)   Education / Equipment: Educated in HEP, fall prevention  Plan: Patient agrees to discharge.  Patient goals were partially met. Patient is being discharged due to being pleased with the current functional level.  ?????Recommend PT eval or screen in 6-9 months due to progressive nature of disease.     , PT 07/01/19 6:36 PM Phone: [phone] Fax: [phone]       

## 2019-09-29 ENCOUNTER — Ambulatory Visit: Payer: Medicare Other | Admitting: Neurology

## 2019-10-27 ENCOUNTER — Ambulatory Visit: Payer: Medicare Other

## 2019-11-10 ENCOUNTER — Telehealth: Payer: Medicare Other | Admitting: Physician Assistant

## 2019-12-29 ENCOUNTER — Other Ambulatory Visit: Payer: Self-pay | Admitting: Neurology

## 2019-12-29 NOTE — Telephone Encounter (Signed)
Rx(s) sent to pharmacy electronically.  

## 2020-02-07 NOTE — Progress Notes (Signed)
Assessment/Plan:   1.  Parkinsons Disease  -would like to increase carbidopa/levodopa 25/100 to 7am/11am/3pm/7pm but they think that compliance would be low so we will do carbidopa/levodopa 25/100 CR, 2 at 7am, 1 at 11am, 1 at 4pm.  -increase exercise  2.  Dizziness, related to orthostasis  -Off of Cardizem   Subjective:   Adam Kane was seen today in follow up for Parkinsons disease.  My previous records were reviewed prior to todays visit as well as outside records available to me.  I have not seen the patient in almost a year.  This patient is accompanied in the office by his child who supplements the history.At that point in time, I changed him from the immediate release levodopa to the extended release because of dizziness.  He is still having some dizziness.  I did ask him to follow-up with cardiology after our last visit because they had been monitoring his Cardizem for his A. fib.  He is off of the Cardizem now, but I do not see that he has seen cardiology since 2019.  Pt denies falls but daughter states that walking slower, describes turning en bloc.    No hallucinations.  Mood has been good.  Current prescribed movement disorder medications: Carbidopa/levodopa 25/100 CR, 1 tablet 3 times per day (6:30am/noon/8pm per patients dosing)   PREVIOUS MEDICATIONS: Carbidopa/levodopa 25/100 IR, 1 tablet 3 times per day (dizzy but had dizzy following this so likely unrelated)  ALLERGIES:   Allergies  Allergen Reactions  . Dilaudid [Hydromorphone Hcl] Other (See Comments)    PASSES OUT  . Morphine And Related Other (See Comments)    PASSES OUT    CURRENT MEDICATIONS:  Outpatient Encounter Medications as of 02/09/2020  Medication Sig  . aspirin EC 81 MG tablet Take 81 mg by mouth daily.  Marland Kitchen atorvastatin (LIPITOR) 40 MG tablet Take 40 mg by mouth at bedtime.   . Carbidopa-Levodopa ER (SINEMET CR) 25-100 MG tablet controlled release 2 at 7am, 1 at 11am, 1 at 4pm.  .  cholecalciferol (VITAMIN D) 1000 UNITS tablet Take 1,000 Units by mouth daily.  . digoxin (LANOXIN) 0.125 MG tablet Take 1 tablet (0.125 mg total) daily by mouth.  . diltiazem (TIAZAC) 180 MG 24 hr capsule Take 1 capsule (180 mg total) by mouth daily.  Marland Kitchen ezetimibe (ZETIA) 10 MG tablet Take 10 mg by mouth every morning.   Marland Kitchen omeprazole (PRILOSEC) 20 MG capsule Take 20 mg by mouth daily.   . vitamin B-12 (CYANOCOBALAMIN) 1000 MCG tablet Take 1,000 mcg by mouth daily.  Marland Kitchen warfarin (COUMADIN) 2 MG tablet Take 2 mg by mouth daily at 6 PM. Take 1.5 tab Monday, Wednesday, and Friday. Take 1 tablet all other days.  . [DISCONTINUED] Carbidopa-Levodopa ER (SINEMET CR) 25-100 MG tablet controlled release TAKE 1 TABLET BY MOUTH 3 TIMES A DAY.   No facility-administered encounter medications on file as of 02/09/2020.    Objective:   PHYSICAL EXAMINATION:    VITALS:   Vitals:   02/09/20 1458  BP: 127/86  Pulse: 93  Resp: 20  SpO2: 95%  Weight: 236 lb (107 kg)  Height: 6\' 5"  (1.956 m)    GEN:  The patient appears stated age and is in NAD. HEENT:  Normocephalic, atraumatic.  The mucous membranes are moist. The superficial temporal arteries are without ropiness or tenderness. CV:  RRR Lungs:  CTAB Neck/HEME:  There are no carotid bruits bilaterally.  Neurological examination:  Orientation: The patient is  alert and oriented x3. Cranial nerves: There is good facial symmetry with mild facial hypomimia. The speech is fluent and clear. Soft palate rises symmetrically and there is no tongue deviation. Hearing is intact to conversational tone. Sensation: Sensation is intact to light touch throughout Motor: Strength is at least antigravity x4.  Movement examination: Tone: There is mild increased tone in the LUE Abnormal movements: there is LUE rest tremor. Coordination:  There is mild decremation with RAM's, esp on the L Gait and Station: The patient has no difficulty arising out of a deep-seated  chair without the use of the hands. The patient's stride length is slightly decreased.    I have reviewed and interpreted the following labs independently    Chemistry      Component Value Date/Time   NA 141 01/12/2018 1605   K 3.9 01/12/2018 1605   CL 107 01/12/2018 1605   CO2 26 01/12/2018 1605   BUN 16 01/12/2018 1605   CREATININE 1.01 01/12/2018 1605      Component Value Date/Time   CALCIUM 10.0 01/12/2018 1605   ALKPHOS 125 (H) 08/18/2014 1649   AST 27 08/18/2014 1649   ALT 24 08/18/2014 1649   BILITOT 1.1 08/18/2014 1649       Lab Results  Component Value Date   WBC 6.9 01/12/2018   HGB 16.3 01/12/2018   HCT 50.6 01/12/2018   MCV 88.9 01/12/2018   PLT 185 01/12/2018    No results found for: TSH   Total time spent on today's visit was 30 minutes, including both face-to-face time and nonface-to-face time.  Time included that spent on review of records (prior notes available to me/labs/imaging if pertinent), discussing treatment and goals, answering patient's questions and coordinating care.  Cc:  Crist Infante, MD

## 2020-02-09 ENCOUNTER — Encounter: Payer: Self-pay | Admitting: Neurology

## 2020-02-09 ENCOUNTER — Ambulatory Visit: Payer: Medicare Other | Admitting: Neurology

## 2020-02-09 ENCOUNTER — Other Ambulatory Visit: Payer: Self-pay

## 2020-02-09 VITALS — BP 127/86 | HR 93 | Resp 20 | Ht 77.0 in | Wt 236.0 lb

## 2020-02-09 DIAGNOSIS — R42 Dizziness and giddiness: Secondary | ICD-10-CM | POA: Diagnosis not present

## 2020-02-09 DIAGNOSIS — G20 Parkinson's disease: Secondary | ICD-10-CM

## 2020-02-09 MED ORDER — CARBIDOPA-LEVODOPA ER 25-100 MG PO TBCR: EXTENDED_RELEASE_TABLET | ORAL | 1 refills | Status: DC

## 2020-02-09 NOTE — Patient Instructions (Addendum)
1.  Increase carbidopa/levodopa 25/100 CR, 2 at 7am, 1 at 11am, 1 at 4pm. 2.  Exercise  The physicians and staff at [facility] are committed to providing excellent care. You may receive a survey requesting feedback about your experience at our office. We strive to receive "very good" responses to the survey questions. If you feel that your experience would prevent you from giving the office a "very good " response, please contact our office to try to remedy the situation. We may be reached at [phone]. Thank you for taking the time out of your busy day to complete the survey.

## 2020-03-09 ENCOUNTER — Encounter (HOSPITAL_COMMUNITY): Payer: Self-pay

## 2020-03-09 ENCOUNTER — Emergency Department (HOSPITAL_COMMUNITY): Payer: Medicare Other

## 2020-03-09 ENCOUNTER — Emergency Department (HOSPITAL_COMMUNITY)
Admission: EM | Admit: 2020-03-09 | Discharge: 2020-03-09 | Disposition: A | Discharge status: Home / Self Care | Payer: Medicare Other | Attending: Emergency Medicine | Admitting: Emergency Medicine

## 2020-03-09 ENCOUNTER — Other Ambulatory Visit: Payer: Self-pay

## 2020-03-09 DIAGNOSIS — I2510 Atherosclerotic heart disease of native coronary artery without angina pectoris: Secondary | ICD-10-CM | POA: Diagnosis not present

## 2020-03-09 DIAGNOSIS — Z7982 Long term (current) use of aspirin: Secondary | ICD-10-CM | POA: Insufficient documentation

## 2020-03-09 DIAGNOSIS — Z87891 Personal history of nicotine dependence: Secondary | ICD-10-CM | POA: Diagnosis not present

## 2020-03-09 DIAGNOSIS — R109 Unspecified abdominal pain: Secondary | ICD-10-CM | POA: Diagnosis present

## 2020-03-09 DIAGNOSIS — I714 Abdominal aortic aneurysm, without rupture, unspecified: Secondary | ICD-10-CM

## 2020-03-09 DIAGNOSIS — Z7901 Long term (current) use of anticoagulants: Secondary | ICD-10-CM | POA: Insufficient documentation

## 2020-03-09 DIAGNOSIS — I10 Essential (primary) hypertension: Secondary | ICD-10-CM | POA: Diagnosis not present

## 2020-03-09 DIAGNOSIS — N200 Calculus of kidney: Secondary | ICD-10-CM

## 2020-03-09 DIAGNOSIS — Z951 Presence of aortocoronary bypass graft: Secondary | ICD-10-CM | POA: Diagnosis not present

## 2020-03-09 DIAGNOSIS — Z79899 Other long term (current) drug therapy: Secondary | ICD-10-CM | POA: Diagnosis not present

## 2020-03-09 DIAGNOSIS — Z8546 Personal history of malignant neoplasm of prostate: Secondary | ICD-10-CM | POA: Insufficient documentation

## 2020-03-09 DIAGNOSIS — G20 Parkinson's disease: Secondary | ICD-10-CM | POA: Insufficient documentation

## 2020-03-09 LAB — URINALYSIS, ROUTINE W REFLEX MICROSCOPIC
Bacteria, UA: NONE SEEN
Bilirubin Urine: NEGATIVE
Glucose, UA: NEGATIVE mg/dL
Ketones, ur: NEGATIVE mg/dL
Leukocytes,Ua: NEGATIVE
Nitrite: NEGATIVE
Protein, ur: NEGATIVE mg/dL
RBC / HPF: >50 RBC/hpf — ABNORMAL HIGH (ref 0–5)
Specific Gravity, Urine: 1.012 (ref 1.005–1.030)
pH: 7 (ref 5.0–8.0)

## 2020-03-09 MED ORDER — TAMSULOSIN HCL 0.4 MG PO CAPS: 0.4000 mg | ORAL_CAPSULE | Freq: Once | ORAL | 0 refills | Status: AC

## 2020-03-09 MED ORDER — HYDROCODONE-ACETAMINOPHEN 5-325 MG PO TABS: 2.0000 | ORAL_TABLET | Freq: Four times a day (QID) | ORAL | 0 refills | Status: DC | PRN

## 2020-03-09 NOTE — Discharge Instructions (Signed)
Watch sedation from pain medication

## 2020-03-09 NOTE — ED Provider Notes (Signed)
[facility] DEPT Provider Note   CSN: [id number] Arrival date & time: 03/09/20  1101     History Chief Complaint  Patient presents with  . Flank Pain    Adam Kane is a 86 y.o. male.  The history is provided by the patient.  Flank Pain This is a new problem. Episode onset: 4 weeks. The problem occurs constantly. Associated symptoms include abdominal pain. Nothing aggravates the symptoms. Nothing relieves the symptoms. He has tried nothing for the symptoms. The treatment provided no relief.  Pt has a history of kidney stones.  Pt reports he has had pain in his low back and his abdomen for the past 4 weeks      Past Medical History:  Diagnosis Date  . A-fib ([place])   . Abdominal aortic aneurysm (AAA), 30-34 mm diameter (HCC)   . Arthritis   . CAD (coronary artery disease)   . Cancer [facility])    prostate cancer 2009  . Chronic kidney disease    kidney stone  . Complication of anesthesia    if given anesthesia too quickly experiences nausea and vomiting   . Diverticulosis of colon without hemorrhage   . Dyslipidemia   . Dysrhythmia   . Early-onset Parkinson's disease ([place])   . GERD (gastroesophageal reflux disease)   . Heart murmur    childhood  . History of kidney stones   . PONV (postoperative nausea and vomiting)   . S/P CABG (coronary artery bypass graft) 2001  . Shortness of breath    mild exertion due to afib  . Systemic hypertension   . Urinary leakage     Patient Active Problem List   Diagnosis Date Noted  . Dyslipidemia (high LDL; low HDL) 07/15/2018  . Long term current use of anticoagulant 07/15/2018  . Lumbar disc herniation 01/15/2018  . Parkinson's disease ([place]) 08/01/2017  . Coronary artery disease involving native coronary artery of native heart without angina pectoris 07/10/2017  . Acute renal insufficiency 08/22/2014  . AAA (abdominal aortic aneurysm) without rupture ([place]) 08/22/2014  . Diverticulosis of colon  without hemorrhage 08/22/2014  . Right ureteral stone 08/21/2014  . Hyperlipidemia 05/31/2013  . Essential hypertension 03/03/2013  . Leukocytosis, unspecified 03/03/2013  . 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 Chronic a-fib ([place]) 03/02/2013    Past Surgical History:  Procedure Laterality Date  . APPENDECTOMY    . CARDIAC CATHETERIZATION  02/20/94  . CATARACT EXTRACTION, BILATERAL    . CHOLECYSTECTOMY    . CORONARY ARTERY BYPASS GRAFT  2001   LIMA to LAD,SVG to PDA  . CORONARY ARTERY BYPASS GRAFT     2 vessel 2001  . CYSTOSCOPY WITH RETROGRADE PYELOGRAM, URETEROSCOPY AND STENT PLACEMENT Left 05/18/2014   Procedure: CYSTOSCOPY WITH RETROGRADE PYELOGRAM, URETEROSCOPY AND STENT PLACEMENT;  Surgeon: Raynelle Bring, MD;  Location: [facility];  Service: Urology;  Laterality: Left;  . CYSTOSCOPY WITH RETROGRADE PYELOGRAM, URETEROSCOPY AND STENT PLACEMENT Right 08/21/2014   Procedure: CYSTOSCOPY WITH RETROGRADE PYELOGRAM, URETEROSCOPY AND STENT PLACEMENT WITH STONE EXTRACTION basket;  Surgeon: Malka So, MD;  Location: [facility];  Service: Urology;  Laterality: Right;  . HERNIA REPAIR    . HOLMIUM LASER APPLICATION Left 8/89/1694   Procedure: HOLMIUM LASER APPLICATION;  Surgeon: Raynelle Bring, MD;  Location: [facility];  Service: Urology;  Laterality: Left;  . LUMBAR LAMINECTOMY/DECOMPRESSION MICRODISCECTOMY Left 03/07/2013   Procedure: LUMBAR FOUR-FIVE EXTRAFORAMINAL LUMBAR LAMINECTOMY/DECOMPRESSION MICRODISCECTOMY 1 LEVEL;  Surgeon: Charlie Pitter,  MD;  Location: [facility];  Service: Neurosurgery;  Laterality: Left;  Left lumbar four-five extraforaminal microdiskectomy  . LUMBAR LAMINECTOMY/DECOMPRESSION MICRODISCECTOMY Left 01/15/2018   Procedure: Microdiscectomy - left - Lumbar Four -Lumbar Five extraforaminal;  Surgeon: Earnie Larsson, MD;  Location: [facility];  Service: Neurosurgery;  Laterality: Left;  Microdiscectomy - left - Lumbar Four -Lumbar Five  extraforaminal  . PROSTATECTOMY    . TONSILLECTOMY         Family History  Problem Relation Age of Onset  . Cancer Mother        bone  . Breast cancer Mother   . AAA (abdominal aortic aneurysm) Father   . Throat cancer Father        mouth    Social History   Tobacco Use  . Smoking status: Former Smoker    Types: Cigarettes    Quit date: 09/01/1973    Years since quitting: 46.5  . Smokeless tobacco: Never Used  Vaping Use  . Vaping Use: Never used  Substance Use Topics  . Alcohol use: No  . Drug use: No    Home Medications Prior to Admission medications   Medication Sig Start Date End Date Taking? Authorizing Provider  aspirin EC 81 MG tablet Take 81 mg by mouth daily.   Yes [provider]  atorvastatin (LIPITOR) 40 MG tablet Take 40 mg by mouth at bedtime.    Yes [provider]  Carbidopa-Levodopa ER (SINEMET CR) 25-100 MG tablet controlled release 2 at 7am, 1 at 11am, 1 at 4pm. Patient taking differently: Take 1-2 tablets by mouth in the morning, at noon, and at bedtime. Take 2 tablets at 7am, Take 1 tablet at 11am, Take 1 tablet at 4pm. 02/09/20  Yes Tat, Eustace Quail, DO  cholecalciferol (VITAMIN D) 1000 UNITS tablet Take 2,000 Units by mouth daily.    Yes [provider]  digoxin (LANOXIN) 0.125 MG tablet Take 1 tablet (0.125 mg total) daily by mouth. 07/10/17  Yes Croitoru, Mihai, MD  diltiazem (TIAZAC) 180 MG 24 hr capsule Take 1 capsule (180 mg total) by mouth daily. 07/15/18  Yes Croitoru, Mihai, MD  ezetimibe (ZETIA) 10 MG tablet Take 10 mg by mouth every morning.    Yes [provider]  omeprazole (PRILOSEC) 20 MG capsule Take 20 mg by mouth daily.    Yes [provider]  vitamin B-12 (CYANOCOBALAMIN) 1000 MCG tablet Take 1,000 mcg by mouth daily.   Yes [provider]  warfarin (COUMADIN) 2 MG tablet Take 2-3 mg by mouth daily at 6 PM. Take 1.5 tablets (3 mg) on (Mon, Wed, Fri, Sat). Take 1 tablet all other days.    Yes [provider]    Allergies    Dilaudid [hydromorphone hcl] and Morphine and related  Review of Systems   Review of Systems  Gastrointestinal: Positive for abdominal pain.  Genitourinary: Positive for flank pain.  All other systems reviewed and are negative.   Physical Exam Updated Vital Signs BP (!) 156/107   Pulse 71   Temp 97.7 F (36.5 C) (Oral)   Resp (!) 23   SpO2 96%   Physical Exam Vitals and nursing note reviewed.  Constitutional:      Appearance: He is well-developed.  HENT:     Head: Normocephalic.     Nose: Nose normal.  Cardiovascular:     Rate and Rhythm: Normal rate.  Pulmonary:     Effort: Pulmonary effort is normal.  Abdominal:  General: Abdomen is flat. There is no distension.  Musculoskeletal:        General: Normal range of motion.     Cervical back: Normal range of motion.  Skin:    General: Skin is warm.  Neurological:     General: No focal deficit present.     Mental Status: He is alert and oriented to person, place, and time.  Psychiatric:        Mood and Affect: Mood normal.     ED Results / Procedures / Treatments   Labs (all labs ordered are listed, but only abnormal results are displayed) Labs Reviewed  URINALYSIS, ROUTINE W REFLEX MICROSCOPIC - Abnormal; Notable for the following components:      Result Value   Hgb urine dipstick LARGE (*)    RBC / HPF >50 (*)    All other components within normal limits    EKG None  Radiology CT Renal Stone Study  Result Date: 03/09/2020 CLINICAL DATA:  Left flank pain for 4 weeks EXAM: CT ABDOMEN AND PELVIS WITHOUT CONTRAST TECHNIQUE: Multidetector CT imaging of the abdomen and pelvis was performed following the standard protocol without IV contrast. COMPARISON:  08/18/2014 CT, ultrasound 01/14/2018 FINDINGS: Lower chest: Pleural thickening with rounded atelectasis at the right lung base not appreciably changed from prior CT. Hepatobiliary: Numerous fluid attenuation  cysts are again seen throughout both lobes of the liver similar in distribution to prior. Several cysts have increased in size from the previous study including one within the inferior aspect of the right hepatic lobe now measuring 4.1 cm (series 2, image 35), previously measuring 2.7 cm. Liver appears otherwise within normal limits. Prior cholecystectomy. No biliary dilatation. Pancreas: Unremarkable. No pancreatic ductal dilatation or surrounding inflammatory changes. Spleen: Normal in size without focal abnormality. Adrenals/Urinary Tract: Unchanged slight nodularity of the bilateral adrenal glands without enlarging mass or nodule. Simple 2.5 cm lower pole right renal cyst tiny punctate calcification within the right renal pelvis may rib represent a tiny nonobstructing stone versus a vascular calcification. Small hypodensity within the upper pole of the left kidney is too small to definitively characterize. No hydronephrosis of either kidney. There is a 4 x 2 mm nonobstructing stone within the mid right ureter (series 5, image 78; series 2, image 60). Left ureter unremarkable. Urinary bladder is within normal limits. Stomach/Bowel: Stomach and small bowel are within normal limits. No dilated loops of bowel. Extensive colonic diverticulosis within the sigmoid colon. No evidence of focal bowel wall thickening. No Peri colonic inflammatory changes. Vascular/Lymphatic: Infrarenal abdominal aorta measures 4.3 x 3.8 cm in diameter (series 6, image 120; series 5, image 75), previously measured 3.9 x 3.6 cm on ultrasound 01/14/2018. Abdominal aortic atherosclerosis. No abdominopelvic lymphadenopathy. Reproductive: Prior prostatectomy. Other: Small fat containing bilateral inguinal hernias. No free air or free fluid. Musculoskeletal: No acute or significant osseous findings. IMPRESSION: 1. There is a 4 x 2 mm nonobstructing stone within the mid right ureter. No hydronephrosis of either kidney. 2. Infrarenal abdominal  aortic aneurysm measuring up to 4.3 cm in diameter, previously measured 3.9 cm on ultrasound 01/14/2018. Given the interval growth, vascular surgery referral/consultation is recommended if not already obtained. Aortic aneurysm NOS (ICD10-I71.9) 3. Extensive sigmoid diverticulosis without evidence of acute diverticulitis. 4. Aortic Atherosclerosis (ICD10-I70.0). Electronically Signed   By: Davina Poke D.O.   On: 03/09/2020 12:29    Procedures Procedures (including critical care time)  Medications Ordered in ED Medications - No data to display  ED Course  I have reviewed the triage vital signs and the nursing notes.  Pertinent labs & imaging results that were available during my care of the patient were reviewed by me and considered in my medical decision making (see chart for details).    MDM Rules/Calculators/A&P                          MDM:  4x2 mm stone right mid ureter.  Aortic aneurysm noted to have increased in size  I discussed findings and follow up with Marland Mcalpine Daughter Final Clinical Impression(s) / ED Diagnoses Final diagnoses:  Nephrolithiasis  Abdominal aortic aneurysm (AAA) without rupture ([place])    Rx / DC Orders ED Discharge Orders         Ordered    HYDROcodone-acetaminophen (NORCO/VICODIN) 5-325 MG tablet  Every 6 hours PRN     Discontinue  Reprint     03/09/20 1441    tamsulosin (FLOMAX) 0.4 MG CAPS capsule   Once     Discontinue  Reprint     03/09/20 1441        An After Visit Summary was printed and given to the patient.    Fransico Meadow, Hershal Coria 03/09/20 1442    Davonna Belling, MD 03/09/20 1538

## 2020-03-09 NOTE — ED Triage Notes (Signed)
EMS reports from home, abdominal pain/left flank x 4 weeks, Hx of kidney stones.  BP 180/111 HR 70 RR 18 Sp02 96 RA

## 2020-08-20 NOTE — Progress Notes (Signed)
Assessment/Plan:   1.  Parkinsons Disease  -Increase carbidopa/levodopa 25/100 CR, 2 at 10am, 2 at 1pm, 2 at 4pm.   -discussed with him walker at all times  -discussed physical therapy but declined for now.  Discussed having a physical trainer in the home.   Subjective:   Adam Kane was seen today in follow up for Parkinsons disease.  My previous records were reviewed prior to todays visit as well as outside records available to me. Pt had a fall - was outside blowing leaves wearing [employer] slides - he wasn't sure if he passed out or just fell but he did call his son in law and they helped him up.  This was about 2-3 months ago.  He saw his PCP and had xrays after the event.  He did have one fall into the sofa.  Got a lift chair that helped.  He isn't active with exercise.   No hallucinations.  Mood has been good.  Current prescribed movement disorder medications: carbidopa/levodopa 25/100 CR, 2 at 7am, 1 at 11am, 1 at 4pm.  (Increased last visit) but waking up later so taking 2 at 9am but still taking 1 at 11am and 1 at 4pm.  (going to bed at 6-6:30 pm)  ALLERGIES:   Allergies  Allergen Reactions  . Dilaudid [Hydromorphone Hcl] Other (See Comments)    PASSES OUT  . Morphine And Related Other (See Comments)    PASSES OUT    CURRENT MEDICATIONS:  Outpatient Encounter Medications as of 08/30/2020  Medication Sig  . aspirin EC 81 MG tablet Take 81 mg by mouth daily.  Marland Kitchen atorvastatin (LIPITOR) 40 MG tablet Take 40 mg by mouth at bedtime.   . Carbidopa-Levodopa ER (SINEMET CR) 25-100 MG tablet controlled release 2 at 7am, 1 at 11am, 1 at 4pm. (Patient taking differently: Take 1-2 tablets by mouth in the morning, at noon, and at bedtime. Take 2 tablets at 7am, Take 1 tablet at 11am, Take 1 tablet at 4pm.)  . cholecalciferol (VITAMIN D) 1000 UNITS tablet Take 2,000 Units by mouth daily.   . digoxin (LANOXIN) 0.125 MG tablet Take 1 tablet (0.125 mg total) daily by mouth.  . diltiazem  (TIAZAC) 180 MG 24 hr capsule Take 1 capsule (180 mg total) by mouth daily.  Marland Kitchen ezetimibe (ZETIA) 10 MG tablet Take 10 mg by mouth every morning.   Marland Kitchen omeprazole (PRILOSEC) 20 MG capsule Take 20 mg by mouth daily.   . tamsulosin (FLOMAX) 0.4 MG CAPS capsule Take 0.4 mg by mouth daily.  . vitamin B-12 (CYANOCOBALAMIN) 1000 MCG tablet Take 1,000 mcg by mouth daily.  Marland Kitchen warfarin (COUMADIN) 2 MG tablet Take 2-3 mg by mouth daily at 6 PM. Take 1.5 tablets (3 mg) on (Mon, Wed, Fri, Sat). Take 1 tablet all other days.  . [DISCONTINUED] HYDROcodone-acetaminophen (NORCO/VICODIN) 5-325 MG tablet Take 2 tablets by mouth every 6 (six) hours as needed for moderate pain. (Patient not taking: Reported on 08/30/2020)   No facility-administered encounter medications on file as of 08/30/2020.    Objective:   PHYSICAL EXAMINATION:    VITALS:   Vitals:   08/30/20 1440  BP: 106/66  Pulse: 81  SpO2: 98%  Weight: 224 lb (101.6 kg)  Height: 6\' 5"  (1.956 m)    GEN:  The patient appears stated age and is in NAD. HEENT:  Normocephalic, atraumatic.  The mucous membranes are moist. The superficial temporal arteries are without ropiness or tenderness. CV:  RRR Lungs:  CTAB Neck/HEME:  There are no carotid bruits bilaterally.  Neurological examination:  Orientation: The patient is alert and oriented x3. Cranial nerves: There is good facial symmetry with facial hypomimia. The speech is fluent and clear. Soft palate rises symmetrically and there is no tongue deviation. Hearing is intact to conversational tone. Sensation: Sensation is intact to light touch throughout Motor: Strength is at least antigravity x4.  Movement examination: Tone: There is mod increased tone in the LUE Abnormal movements: there is LUE rest tremor Coordination:  There is mild decremation with RAM's Gait and Station: The patient has mild difficulty arising out of a deep-seated chair without the use of the hands. The patient's stride  length is decreased with short shuffling steps.     Labs: None available for review.  Done by pcp out of system  Total time spent on today's visit was 30 minutes, including both face-to-face time and nonface-to-face time.  Time included that spent on review of records (prior notes available to me/labs/imaging if pertinent), discussing treatment and goals, answering patient's questions and coordinating care.  Cc:  Crist Infante, MD

## 2020-08-30 ENCOUNTER — Other Ambulatory Visit: Payer: Self-pay

## 2020-08-30 ENCOUNTER — Encounter: Payer: Self-pay | Admitting: Neurology

## 2020-08-30 ENCOUNTER — Ambulatory Visit: Payer: Medicare Other | Admitting: Neurology

## 2020-08-30 VITALS — BP 106/66 | HR 81 | Ht 77.0 in | Wt 224.0 lb

## 2020-08-30 DIAGNOSIS — G20 Parkinson's disease: Secondary | ICD-10-CM | POA: Diagnosis not present

## 2020-08-30 MED ORDER — AMBULATORY NON FORMULARY MEDICATION: 0 refills | Status: AC

## 2020-08-30 MED ORDER — CARBIDOPA-LEVODOPA ER 25-100 MG PO TBCR: 2.0000 | EXTENDED_RELEASE_TABLET | Freq: Three times a day (TID) | ORAL | 1 refills | Status: DC

## 2020-08-30 NOTE — Patient Instructions (Addendum)
1.  Increase carbidopa/levodopa CR, 2 at 10am, 2 at 1pm, 2 at 4pm  2.  I would like you to use a walker at all times (2 walker/2 tennis balls)  3.  Let us know if you change your mind on physical therapy  Parkinsons Electronics engineer for [employer] Parkinson's Group  Local  Online Groups  o [employer] Parkinson[employer] Group :   - [employer]'s Patient Education Group will be Wednesday, December 8th at 2pm via Zoom.   - Upcoming [employer] Parkinson's Meetings:  2nd Wednesdays of the month at 2 pm:       January 12th, February 9th - Amy Bernie Covey, PT at [facility] has resumed the lead of this group starting in July.  Contact Amy at [email or web address] if interested in participating in this online group o Parkinson's Care Partners Group:    3rd Mondays, Contact Lynwood Dawley o Atypical Parkinsonian Patient Group:   4th Wednesdays, Contact Lynwood Dawley o If you are interested in participating in these online groups with Maralyn Sago, please contact her directly for how to join those meetings.  Her contact information is [email or web address].  She will send you a link to join the [employer].  (Please note that Lynwood Dawley , MSW, LCSW, has resigned her position at [facility], but will continue to lead the online groups temporarily)    Parkinson Foundation:  www.[email or web address] o PD Health at Home continues:  Mindfulness Mondays, Expert Briefing Tuesdays, Wellness Wednesdays, Take Time Thursdays, Fitness Fridays  o [employer]:  (Next one is February 2022, stay tuned) o Please check out their website to sign up for emails and see their full online offerings    [employer]:  [email or web address]  o Upcoming Webinar:   Stay tuned for 2022 o Check out additional information on their website to see their full online offerings    [employer]:   [email or web address] o Upcoming Webinar:  Stay tuned for 2022 o Care Partner Monthly Meetup.  With Jillene Bucks [place].  First Tuesday of each month, 2 pm o Check out additional information to Live Well Today on their website    Parkinson and Movement Disorders (PMD) Alliance:  www.pmdalliance.org o [employer]:  Online Education Events o Sign up for emails, which are sent weekly to give you updates on programming and online offerings    Parkinson's Association of [place]:  www.parkinsonassociation.org o Information on online support groups, online exercises including Yoga, Parkinson's exercises and more-LOTS of information on links to PD resources and online events o Virtual Support Group through Parkinson's Association of [place]; next one is scheduled for Wednesday, August 01, 2020 at 2 pm. (These are typically scheduled for the 1st Wednesday of the month at 2 pm).  Visit website for details.    Additional links for movement activities: o PWR! Moves Classes at [facility] Exercise Room HAVE RESUMED, at a limited capacity.  We have several openings for Wednesday 10 am and 11 am classes.  Contact Amy Marriott, PT [email or web address] or [phone] if interested o Here is a link to the PWR!Moves classes on Zoom from [place] - Daily Mon-Sat at 10:00. Via Zoom, FREE and open to all.  There is also a link below via Facebook if you use that platform. - [email or web address]is - [email or web address] o Parkinson's Wellness Recovery (PWR! Moves)  www.pwr4life.org - Info on the PWR! Virtual Experience:  You will have access to our expertise  through self-assessment, guided plans that start with the PD-specific fundamentals, educational content, tips, Q&A with  an expert, and a growing Art therapist of PD-specific pre-recorded and live exercise classes of varying types and intensity - both physical and cognitive! If that is not enough, we offer 1:1 wellness consultations (in-person or virtual) to personalize your PWR! Research scientist (medical).  - Check out the PWR! Move of the month on the [place] [facility] website:  [email or web address] o [employer] Fridays:  - As part of the PD Health @ Home program, this free video series focuses each week on one aspect of fitness designed to support people living with Parkinson's.  -  HollywoodSale.dk o Dance for PD website is offering free, live-stream classes throughout the week, as well as links to [employer] of classes:  [email or web address] o Transport planner for Parkinson's Class:  [place] is back this Fall!  Free offering for people with Parkinson's and care partners; virtual class this Fall. The class will be Wednesdays 4-5pm beginning 10/13.  Classes will run for 9 weeks 10/13-12/15,.  Register below: o [email or web address]&SEASON=Parkinsons+Dance-[email or web address]+2021  o For more information, contact [phone] or email Ruffin Frederick at [email or web address] o Virtual dance and Pilates for Parkinson's classes: Click on the Community Tab> Parkinson's Movement Initiative Tab.  To register for classes and for more information, visit www.[email or web address] and click the community tab.  o [employer] Parkinson's Cycling Classes  - [employer]: 1pm on Fridays-Live classes at [facility] [employer] at [email or web address] or [phone]) [facility] [employer]: Virtual Classes Mondays and Thursdays (contact [place] at [email or web address] or [phone])   o [place] levels of classes are offered Tuesdays and Thursdays:  10:30 am,  12 noon & 1:45 pm at [facility]. To observe a class or for  more information, call [phone] or email [email or web address]  [employer]: o Investment banker, operational Education Opportunities:  www.well-springsolutions.org/caregiver-education/caregiver-support-group.  You may also contact Vickki Muff at [email or web address] or [phone].   o Caregiver Virtual Event:   [facility] is having a [phone], Thursday, December 9th from 6-7 pm - Contact 09-10-1988 (above) for details o [employer] Navigator:  Just1Navigator program, a free service to help individuals and families through the journey of determining care for older adults.  The Navigator is a Vickki Muff, Favor Rauner, who will speak with a prospective client and/or loved ones to provide an assessment of the situation and a set of recommendations for a personalized care plan -- all free of charge, and whether [employer] offers the needed service or not. If the need is not a service we provide, we are well-connected with reputable programs in town that we can refer you to.  www.well-springsolutions.org or to speak with the Navigator, call [phone].

## 2020-09-03 ENCOUNTER — Other Ambulatory Visit: Payer: Self-pay | Admitting: Adult Health

## 2020-09-03 DIAGNOSIS — U071 COVID-19: Secondary | ICD-10-CM

## 2020-09-03 NOTE — Progress Notes (Signed)
I connected by phone with Adam Kane on 09/03/2020 at 4:08 PM to discuss the potential use of a new treatment for mild to moderate COVID-19 viral infection in non-hospitalized patients.  This patient is a 87 y.o. male that meets the FDA criteria for Emergency Use Authorization of COVID monoclonal antibody Sotrovimab.  Has a (+) direct SARS-CoV-2 viral test result  Has mild or moderate COVID-19   Is NOT hospitalized due to COVID-19  Is within 10 days of symptom onset  Has at least one of the high risk factor(s) for progression to severe COVID-19 and/or hospitalization as defined in EUA.  Specific high risk criteria : Older age (>/= 85 yo), BMI > 25 and Cardiovascular disease or hypertension   Symptom onset 09/03/2019   I have spoken and communicated the following to the patient or parent/caregiver regarding COVID monoclonal antibody treatment:  1. FDA has authorized the emergency use for the treatment of mild to moderate COVID-19 in adults and pediatric patients with positive results of direct SARS-CoV-2 viral testing who are 43 years of age and older weighing at least 40 kg, and who are at high risk for progressing to severe COVID-19 and/or hospitalization.  2. The significant known and potential risks and benefits of COVID monoclonal antibody, and the extent to which such potential risks and benefits are unknown.  3. Information on available alternative treatments and the risks and benefits of those alternatives, including clinical trials.  4. Patients treated with COVID monoclonal antibody should continue to self-isolate and use infection control measures (e.g., wear mask, isolate, social distance, avoid sharing personal items, clean and disinfect "high touch" surfaces, and frequent handwashing) according to CDC guidelines.   5. The patient or parent/caregiver has the option to accept or refuse COVID monoclonal antibody treatment. 6. Cost reviewed  After reviewing this  information with the patient, the patient has agreed to receive one of the available covid 19 monoclonal antibodies and will be provided an appropriate fact sheet prior to infusion. Noreene Filbert, NP 09/03/2020 4:08 PM

## 2020-09-04 ENCOUNTER — Ambulatory Visit (HOSPITAL_COMMUNITY)
Admission: RE | Admit: 2020-09-04 | Discharge: 2020-09-04 | Disposition: A | Discharge status: Home / Self Care | Payer: Medicare Other | Source: Ambulatory Visit | Attending: Pulmonary Disease | Admitting: Pulmonary Disease

## 2020-09-04 DIAGNOSIS — U071 COVID-19: Secondary | ICD-10-CM | POA: Diagnosis present

## 2020-09-04 MED ORDER — METHYLPREDNISOLONE SODIUM SUCC 125 MG IJ SOLR: 125.0000 mg | Freq: Once | INTRAMUSCULAR | Status: DC | PRN

## 2020-09-04 MED ORDER — ALBUTEROL SULFATE HFA 108 (90 BASE) MCG/ACT IN AERS: 2.0000 | INHALATION_SPRAY | Freq: Once | RESPIRATORY_TRACT | Status: DC | PRN

## 2020-09-04 MED ORDER — DIPHENHYDRAMINE HCL 50 MG/ML IJ SOLN: 50.0000 mg | Freq: Once | INTRAMUSCULAR | Status: DC | PRN

## 2020-09-04 MED ORDER — SOTROVIMAB 500 MG/8ML IV SOLN
500.0000 mg | Freq: Once | INTRAVENOUS | Status: AC
  Administered 2020-09-04: 500 mg via INTRAVENOUS

## 2020-09-04 MED ORDER — FAMOTIDINE IN NACL 20-0.9 MG/50ML-% IV SOLN: 20.0000 mg | Freq: Once | INTRAVENOUS | Status: DC | PRN

## 2020-09-04 MED ORDER — SODIUM CHLORIDE 0.9 % IV SOLN: INTRAVENOUS | Status: DC | PRN

## 2020-09-04 MED ORDER — EPINEPHRINE 0.3 MG/0.3ML IJ SOAJ: 0.3000 mg | Freq: Once | INTRAMUSCULAR | Status: DC | PRN

## 2020-09-04 NOTE — Progress Notes (Signed)
Patient reviewed Fact Sheet for Patients, Parents, and Caregivers for Emergency Use Authorization (EUA) of sotrovimab for the Treatment of Coronavirus. Patient also reviewed and is agreeable to the estimated cost of treatment. Patient is agreeable to proceed.   

## 2020-09-04 NOTE — Discharge Instructions (Signed)
10 Things You Can Do to Manage Your COVID-19 Symptoms at Home If you have possible or confirmed COVID-19: 1. Stay home from work and school. And stay away from other public places. If you must go out, avoid using any kind of public transportation, ridesharing, or taxis. 2. Monitor your symptoms carefully. If your symptoms get worse, call your healthcare provider immediately. 3. Get rest and stay hydrated. 4. If you have a medical appointment, call the healthcare provider ahead of time and tell them that you have or may have COVID-19. 5. For medical emergencies, call 911 and notify the dispatch personnel that you have or may have COVID-19. 6. Cover your cough and sneezes with a tissue or use the inside of your elbow. 7. Wash your hands often with soap and water for at least 20 seconds or clean your hands with an alcohol-based hand sanitizer that contains at least 60% alcohol. 8. As much as possible, stay in a specific room and away from other people in your home. Also, you should use a separate bathroom, if available. If you need to be around other people in or outside of the home, wear a mask. 9. Avoid sharing personal items with other people in your household, like dishes, towels, and bedding. 10. Clean all surfaces that are touched often, like counters, tabletops, and doorknobs. Use household cleaning sprays or wipes according to the label instructions. cdc.gov/coronavirus 03/02/2019 This information is not intended to replace advice given to you by your health care provider. Make sure you discuss any questions you have with your health care provider. Document Revised: 08/04/2019 Document Reviewed: 08/04/2019 Elsevier Patient Education  2020 Elsevier Inc. What types of side effects do monoclonal antibody drugs cause?  Common side effects  In general, the more common side effects caused by monoclonal antibody drugs include: . Allergic reactions, such as hives or itching . Flu-like signs and  symptoms, including chills, fatigue, fever, and muscle aches and pains . Nausea, vomiting . Diarrhea . Skin rashes . Low blood pressure   The CDC is recommending patients who receive monoclonal antibody treatments wait at least 90 days before being vaccinated.  Currently, there are no data on the safety and efficacy of mRNA COVID-19 vaccines in persons who received monoclonal antibodies or convalescent plasma as part of COVID-19 treatment. Based on the estimated half-life of such therapies as well as evidence suggesting that reinfection is uncommon in the 90 days after initial infection, vaccination should be deferred for at least 90 days, as a precautionary measure until additional information becomes available, to avoid interference of the antibody treatment with vaccine-induced immune responses. If you have any questions or concerns after the infusion please call the Advanced Practice Provider on call at [phone]. This number is ONLY intended for your use regarding questions or concerns about the infusion post-treatment side-effects.  Please do not provide this number to others for use. For return to work notes please contact your primary care provider.   If someone you know is interested in receiving treatment please have them call the COVID hotline at [phone].   

## 2021-01-30 NOTE — Progress Notes (Signed)
Assessment/Plan:   1.  Parkinsons Disease  -take carbidopa/levodopa 25/100 CR, but move dosing to 2 tablets at 9:30 am/12:30pm/3:30pm (currently is taking at 9:30/11am/4pm)  2.  ?RBD  -recently with fall out of the bed  -now with bedrail  3.  Probable mild pdd  -doesn't drive and needs more activity  -[facility]  -met with LCSW today to discuss resources Subjective:   Adam Kane was seen today in follow up for Parkinsons disease.  My previous records were reviewed prior to todays visit as well as outside records available to me. Pt with daughter who supplements the hx.    pt with one fall off of the bed while sleeping - now has a bedrail.  Doing home PT right now.    Pt denies lightheadedness, near syncope.  No hallucinations.  Mood has been good.  Pt states that his coumadin was d/c and changed to eliquis.  Current prescribed movement disorder medications: Carbidopa/levodopa 25/100 CR, 2 tablets at 10 AM, 2 tablets at 1 PM, 2 tablets at 4 PM (increased last visit) - he is currently doing 9:30 am/11am and 4pm   PREVIOUS MEDICATIONS: Sinemet  ALLERGIES:   Allergies  Allergen Reactions  . Dilaudid [Hydromorphone Hcl] Other (See Comments)    PASSES OUT  . Morphine And Related Other (See Comments)    PASSES OUT    CURRENT MEDICATIONS:  Outpatient Encounter Medications as of 01/31/2021  Medication Sig  . AMBULATORY NON FORMULARY MEDICATION Walker, 2 wheels, 2 tennis balls G20  . aspirin EC 81 MG tablet Take 81 mg by mouth daily.  Marland Kitchen atorvastatin (LIPITOR) 40 MG tablet Take 40 mg by mouth at bedtime.   . Carbidopa-Levodopa ER (SINEMET CR) 25-100 MG tablet controlled release Take 2 tablets by mouth in the morning, at noon, and at bedtime. 10am/1pm/4pm  . cholecalciferol (VITAMIN D) 1000 UNITS tablet Take 2,000 Units by mouth daily.   . digoxin (LANOXIN) 0.125 MG tablet Take 1 tablet (0.125 mg total) daily by mouth.  . diltiazem  (TIAZAC) 180 MG 24 hr capsule Take 1 capsule (180 mg total) by mouth daily.  Marland Kitchen ELIQUIS 5 MG TABS tablet Take 1 tablet by mouth 2 (two) times daily.  Marland Kitchen ezetimibe (ZETIA) 10 MG tablet Take 10 mg by mouth every morning.   Marland Kitchen omeprazole (PRILOSEC) 20 MG capsule Take 20 mg by mouth daily.   . tamsulosin (FLOMAX) 0.4 MG CAPS capsule Take 0.4 mg by mouth daily.  . vitamin B-12 (CYANOCOBALAMIN) 1000 MCG tablet Take 1,000 mcg by mouth daily.  . [DISCONTINUED] warfarin (COUMADIN) 2 MG tablet Take 2-3 mg by mouth daily at 6 PM. Take 1.5 tablets (3 mg) on (Mon, Wed, Fri, Sat). Take 1 tablet all other days. (Patient not taking: Reported on 01/31/2021)   No facility-administered encounter medications on file as of 01/31/2021.    Objective:   PHYSICAL EXAMINATION:    VITALS:   Vitals:   01/31/21 1502  BP: 118/68  Pulse: 94  SpO2: 96%  Weight: 226 lb (102.5 kg)  Height: _0  (1.956 m)    GEN:  The patient appears stated age and is in NAD. HEENT:  Normocephalic, atraumatic.  The mucous membranes are moist. The superficial temporal arteries are without ropiness or tenderness. CV:  RRR Lungs:  CTAB Neck/HEME:  There are no carotid bruits bilaterally.  Neurological examination:  Orientation: The patient is alert and oriented x 2 - mild confusion  Cranial nerves: There is good facial symmetry with facial hypomimia. The speech is fluent and clear. Soft palate rises symmetrically and there is no tongue deviation. Hearing is intact to conversational tone. Sensation: Sensation is intact to light touch throughout Motor: Strength is at least antigravity x4.  Movement examination: Tone: There is nl tone in the ue/le Abnormal movements: there is rue rest tremor, mild Coordination:  There is mild decremation with RAM's, Gait and Station:  The patient's stride length is decreased and shuffling with the walker  I have reviewed and interpreted the following labs independently    Chemistry      Component  Value Date/Time   NA 141 01/12/2018 1605   K 3.9 01/12/2018 1605   CL 107 01/12/2018 1605   CO2 26 01/12/2018 1605   BUN 16 01/12/2018 1605   CREATININE 1.01 01/12/2018 1605      Component Value Date/Time   CALCIUM 10.0 01/12/2018 1605   ALKPHOS 125 (H) 08/18/2014 1649   AST 27 08/18/2014 1649   ALT 24 08/18/2014 1649   BILITOT 1.1 08/18/2014 1649       Lab Results  Component Value Date   WBC 6.9 01/12/2018   HGB 16.3 01/12/2018   HCT 50.6 01/12/2018   MCV 88.9 01/12/2018   PLT 185 01/12/2018    No results found for: TSH   Total time spent on today's visit was 22 minutes, including both face-to-face time and nonface-to-face time.  Time included that spent on review of records (prior notes available to me/labs/imaging if pertinent), discussing treatment and goals, answering patient's questions and coordinating care.  Cc:  Crist Infante, MD

## 2021-01-31 ENCOUNTER — Ambulatory Visit: Payer: Medicare Other | Admitting: Neurology

## 2021-01-31 ENCOUNTER — Encounter: Payer: Self-pay | Admitting: Neurology

## 2021-01-31 ENCOUNTER — Other Ambulatory Visit: Payer: Self-pay

## 2021-01-31 VITALS — BP 118/68 | HR 94 | Ht 77.0 in | Wt 226.0 lb

## 2021-01-31 DIAGNOSIS — G20 Parkinson's disease: Secondary | ICD-10-CM

## 2021-01-31 NOTE — Patient Instructions (Signed)
Online Resources for Power over Parkinson's Group May 2022  . Local [place] Online Groups  o Power over Parkinson's Group :    - Upcoming Power over Parkinson's Meetings:  2nd Wednesdays of the month at 2 pm:  June 8th, July 13th - Contact Amy Marriott at [email or web address] if interested in participating in this group o Parkinson's Care Partners Group:    3rd Mondays, Contact Misty Paladino o Atypical Parkinsonian Patient Group:   4th Wednesdays, Contact Misty Paladino o If you are interested in participating in these groups with Misty, please contact her directly for how to join those meetings.  Her contact information is [email or web address].   . Parkinson Foundation:  www.parkinson.org o PD Health at Home continues:  Mindfulness Mondays, Expert Briefing Tuesdays, Wellness Wednesdays, Take Time Thursdays, Fitness Fridays o Register for expert briefings (webinars) at [email or web address] o  Please check out their website to sign up for emails and see their full online offerings  . [employer] Foundation:  www.[email or web address]  o Check out additional information on their website to see their full online offerings  . [employer] Foundation:  www.[email or web address].org o Upcoming Webinar:  Stay tuned o Care Partner Monthly Meetup.  With Connie Carpenter Phinney.  First Tuesday of each month, 2 pm o Joy Breaks:  First Wednesday of each month, 2-3 pm. There will be art, doodling, making, crafting, listening, laughing, stories, and everything in between. No art experience necessary. No supplies required. Just show up for joy!  Register on their website. o Check out additional information to Live Well Today on their website  . Parkinson and Movement Disorders (PMD) Alliance:  www.pmdalliance.org o [employer]:  Online Education Events o Sign up for emails, which are sent weekly to give you updates on programming and online  offerings     . Parkinson's Association of [place]:  www.parkinsonassociation.org o Information on online support groups, education events, and online exercises including Yoga, Parkinson's exercises and more-LOTS of information on links to PD resources and online events o Virtual Support Group through Parkinson's Association of [place]; next one is scheduled for Wednesday, May 4th, 2022 at 2 pm. (These are typically scheduled for the 1st Wednesday of the month at 2 pm).  Visit website for details.  . Additional links for movement activities: o PWR! Moves Classes at [facility] Exercise [facility] HAVE RESUMED!  Wednesdays 10 and 11 am.  Contact Amy Marriott, PT [email or web address] or [phone] if interested o Here is a link to the PWR!Moves classes on Zoom from [place] Parkinson's Foundation - Daily Mon-Sat at 10:00. Via Zoom, FREE and open to all.  There is also a link below via Facebook if you use that platform. - https://www.[email or web address].org/mpf-programs/exercise-and-movement-activities - https://www.facebook.com/ParkinsonsMI.org/posts/pwr-moves-exercise-class-parkinson-wellness-recovery-online-with-angee-ludwa-pt-/10156827878021813/ o Parkinson's Wellness Recovery (PWR! Moves)  www.pwr4life.org - Info on the PWR! Virtual Experience:  You will have access to our expertise through self-assessment, guided plans that start with the PD-specific fundamentals, educational content, tips, Q&A with an expert, and a growing library of PD-specific pre-recorded and live exercise classes of varying types and intensity - both physical and cognitive! If that is not enough, we offer 1:1 wellness consultations (in-person or virtual) to personalize your PWR! Virtual Experience.  - Check out the PWR! Move of the month on the Parkinson Wellness Recovery website:  https://www.pwr4life.org/pwr-move-of-the-month-4/ o Parkinson Foundation Fitness Fridays:  - As part of the PD Health @ Home program,  this free video series focuses each week on one aspect of fitness designed to support people living with Parkinson's.    These weekly videos highlight the Parkinson Foundation recent fitness guidelines for people with Parkinson's disease. -  www.parkinson.org/understanding-parkinsons/coronavirus/PD-health-at-home/Fitness-Fridays o Dance for PD website is offering free, live-stream classes throughout the week, as well as links to digital library of classes:  https://danceforparkinsons.org/ o Dance for Parkinson's Class:  [employer] [place].  Free offering for people with Parkinson's and care partners; virtual class.  o For more information, contact [phone] or email Magalli Morana at [email or web address] o Virtual dance and Pilates for Parkinson's classes: Click on the Community Tab> Parkinson's Movement Initiative Tab.  To register for classes and for more information, visit www.americandancefestival.org and click the "community" tab.     o [employer] Parkinson's Cycling Classes  - [employer]: 1pm on Fridays-Live classes at [employer] (Contact Margaret Hazen at [email or web address] or [phone]) - [facility]: Virtual Classes Mondays and Thursdays /Live classes Tuesday, Wednesday and Thursday (contact Marlee at [email or web address]  or [phone])  o  [employer] - Three levels of classes are offered Tuesdays and Thursdays:  10:30 am,  12 noon & 1:45 pm at [employer] [facility] [facility].  - Active Stretching with Maria, New Class starting in March, on Fridays - To observe a class or for  more information, call [phone] or email [email or web address] . [facility]: o Online Caregiver Education Opportunities:  www.well-springsolutions.org/caregiver-education/caregiver-support-group.  You may also contact Jodi Kolada at [email or web address] or [phone].   o [facility] Navigator:  Just1Navigator program, a free service  to help individuals and families through the journey of determining care for older adults.  The "Navigator" is a social worker, Nicole Lyall, who will speak with a prospective client and/or loved ones to provide an assessment of the situation and a set of recommendations for a personalized care plan -- all free of charge, and whether [facility] offers the needed service or not. If the need is not a service we provide, we are well-connected with reputable programs in town that we can refer you to.  www.well-springsolutions.org or to speak with the Navigator, call [phone].     

## 2021-06-21 ENCOUNTER — Other Ambulatory Visit: Payer: Self-pay | Admitting: Neurology

## 2021-06-21 DIAGNOSIS — G20 Parkinson's disease: Secondary | ICD-10-CM

## 2021-09-09 ENCOUNTER — Ambulatory Visit: Payer: Medicare Other | Admitting: Neurology

## 2021-11-01 NOTE — Progress Notes (Signed)
? ? ?Assessment/Plan:  ? ?1.  Parkinsons Disease ? -take carbidopa/levodopa 25/100 CR, but move dosing to 2 tablets at 9:30 am/12:30pm/3:30pm  ? -referral to PT to home PT (pt requested adept - they want Dominica Severin) ? ?2.  ?RBD ? -recently with fall out of the bed ? -now with bedrail ? ?3.  PDD ? -doesn't drive and needs more activity ? -[facility] and [facility]. ? -discussed med but ultimately we decided to hold off on that.  I think that engagement would help much more than medication impact ? -Met with my LCSW today. ? ?4.  Dizziness ? -orthostatics neg but BP and prostate med still may contribute, esp given propensity for Parkinsons Disease to contribute to Neurogenic Orthostatic Hypotension.  They have appt for blood work with PCP later today.  They have discussed doing tamsulosin qod  ?Subjective:  ? ?Adam Kane was seen today in follow up for Parkinsons disease.  My previous records were reviewed prior to todays visit as well as outside records available to me. Pt with daughter who supplements the hx.    patient last seen June, 2022.  No falls since that time.  Pt with some dizziness (sitting/laying BP normal today).   No hallucinations.  Having memory trouble - pt denies depression but not interactive.  He is not engaged in the day.   [facility]'t [facility].   ? ?Current prescribed movement disorder medications: ?Carbidopa/levodopa 25/100 CR, 2 tablets at 9:30 am/12:30pm/3:30pm  ? ? ?PREVIOUS MEDICATIONS: Sinemet ? ?ALLERGIES:   ?Allergies  ?Allergen Reactions  ? Dilaudid [Hydromorphone Hcl] Other (See Comments)  ?  PASSES OUT  ? Morphine And Related Other (See Comments)  ?  PASSES OUT  ? ? ?CURRENT MEDICATIONS:  ?Outpatient Encounter Medications as of 11/05/2021  ?Medication Sig  ? AMBULATORY NON FORMULARY MEDICATION Walker, 2 wheels, 2 tennis balls ?G20  ? atorvastatin (LIPITOR) 40 MG tablet Take 40 mg by mouth at bedtime.   ? Carbidopa-Levodopa  ER (SINEMET CR) 25-100 MG tablet controlled release TAKE 2 TABLETS IN THE MORNING, AT NOON, AND AT BEDTIME.  ? cholecalciferol (VITAMIN D) 1000 UNITS tablet Take 2,000 Units by mouth daily.   ? digoxin (LANOXIN) 0.125 MG tablet Take 1 tablet (0.125 mg total) daily by mouth.  ? diltiazem (TIAZAC) 180 MG 24 hr capsule Take 1 capsule (180 mg total) by mouth daily.  ? docusate sodium (COLACE) 100 MG capsule Take 100 mg by mouth 2 (two) times daily.  ? ELIQUIS 5 MG TABS tablet Take 1 tablet by mouth 2 (two) times daily.  ? ezetimibe (ZETIA) 10 MG tablet Take 10 mg by mouth every morning.   ? omeprazole (PRILOSEC) 20 MG capsule Take 20 mg by mouth daily.   ? tamsulosin (FLOMAX) 0.4 MG CAPS capsule Take 0.4 mg by mouth daily.  ? vitamin B-12 (CYANOCOBALAMIN) 1000 MCG tablet Take 1,000 mcg by mouth daily.  ? [DISCONTINUED] aspirin EC 81 MG tablet Take 81 mg by mouth daily. (Patient not taking: Reported on 11/05/2021)  ? ?No facility-administered encounter medications on file as of 11/05/2021.  ? ? ?Objective:  ? ?PHYSICAL EXAMINATION:   ? ?VITALS:   ?Vitals:  ? 11/05/21 0905  ?SpO2: 95%  ?Weight: 221 lb 6.4 oz (100.4 kg)  ?Height: 6' 4"  (1.93 m)  ? ?Orthostatic Vitals for the past 48 hrs (Last 6 readings): ? Patient Position Orthostatic BP Orthostatic Pulse  ?11/05/21 0905 Sitting 121/82 59  ?  11/05/21 0910 Standing 123/78 62  ? ? ? ?GEN:  The patient appears stated age and is in NAD. ?HEENT:  Normocephalic, atraumatic.  The mucous membranes are moist. The superficial temporal arteries are without ropiness or tenderness. ?CV:  RRR ?Lungs:  CTAB ?Neck/HEME:  There are no carotid bruits bilaterally. ? ?Neurological examination: ? ?Orientation: The patient is alert and oriented to person/place ?Cranial nerves: There is good facial symmetry with facial hypomimia. The speech is fluent and clear. Soft palate rises symmetrically and there is no tongue deviation. Hearing is intact to conversational tone. ?Sensation: Sensation is intact  to light touch throughout ?Motor: Strength is at least antigravity x4. ? ?Movement examination: ?Tone: There is nl tone in the ue/le ?Abnormal movements: there is bilateral UE ?Coordination:  There is mild decremation with RAM's, ?Gait and Station:  The patient's stride length is decreased and shuffling with the walker ? ?I have reviewed and interpreted the following labs independently ? ?  Chemistry   ?   ?Component Value Date/Time  ? NA 141 01/12/2018 1605  ? K 3.9 01/12/2018 1605  ? CL 107 01/12/2018 1605  ? CO2 26 01/12/2018 1605  ? BUN 16 01/12/2018 1605  ? CREATININE 1.01 01/12/2018 1605  ?    ?Component Value Date/Time  ? CALCIUM 10.0 01/12/2018 1605  ? ALKPHOS 125 (H) 08/18/2014 1649  ? AST 27 08/18/2014 1649  ? ALT 24 08/18/2014 1649  ? BILITOT 1.1 08/18/2014 1649  ?  ? ? ? ?Lab Results  ?Component Value Date  ? WBC 6.9 01/12/2018  ? HGB 16.3 01/12/2018  ? HCT 50.6 01/12/2018  ? MCV 88.9 01/12/2018  ? PLT 185 01/12/2018  ? ? ?No results found for: TSH ? ? ?Total time spent on today's visit was 34 minutes, including both face-to-face time and nonface-to-face time.  Time included that spent on review of records (prior notes available to me/labs/imaging if pertinent), discussing treatment and goals, answering patient's questions and coordinating care. ? ?Cc:  Crist Infante, MD ? ?

## 2021-11-05 ENCOUNTER — Other Ambulatory Visit: Payer: Self-pay

## 2021-11-05 ENCOUNTER — Encounter: Payer: Self-pay | Admitting: Neurology

## 2021-11-05 ENCOUNTER — Ambulatory Visit: Payer: Medicare Other | Admitting: Neurology

## 2021-11-05 ENCOUNTER — Telehealth: Payer: Self-pay

## 2021-11-05 DIAGNOSIS — G20 Parkinson's disease: Secondary | ICD-10-CM

## 2021-11-05 DIAGNOSIS — R42 Dizziness and giddiness: Secondary | ICD-10-CM

## 2021-11-05 DIAGNOSIS — M5126 Other intervertebral disc displacement, lumbar region: Secondary | ICD-10-CM

## 2021-11-05 MED ORDER — CARBIDOPA-LEVODOPA ER 25-100 MG PO TBCR: 2.0000 | EXTENDED_RELEASE_TABLET | Freq: Three times a day (TID) | ORAL | 2 refills | Status: AC

## 2021-11-05 NOTE — Telephone Encounter (Signed)
Faxed order to [facility] Patient requested gary Fax # [phone] ?Phone # [phone] ?

## 2021-11-05 NOTE — Patient Instructions (Signed)
Local and Online Resources for Power over Parkinson's Group March 2023  LOCAL [place] PARKINSON'S GROUPS  Power over Parkinson's Group :   Power Over Parkinson's Patient Education Group will be Wednesday, March 8th-*Hybrid meting*- in person at [place] location and via [facility] at 2:00 pm.   Upcoming Power over Parkinson's Meetings:  2nd Wednesdays of the month at 2 pm:  March 8th, April 12th Contact Amy Marriott at [email or web address] if interested in participating in this group Parkinson's Care Partners Group:    3rd Mondays, Contact Misty Paladino Atypical Parkinsonian Patient Group:   4th Wednesdays, [place] If you are interested in participating in these groups with Misty, please contact her directly for how to join those meetings.  Her contact information is [email or web address].    LOCAL EVENTS AND NEW OFFERINGS Parkinson's Wellness Event:  Pottery Night at [facility] [place].  Friday, March 10th 5:30-7:30 pm.  Sponsored by [employer] [place].  FREE event for people with Parkinson's and care partners.  RSVP to [facility] at [email or web address] Dine out at [facility].  Celebrate Parkinson's disease Awareness Month and Support the Parkinson's Movement Disorder Fund.   Wednesday, April 19th 4-6 pm at [place], [employer].  (Give receipt to cashier and 20% will be donated) Parkinson's T-shirts for sale!  Designed by a local group member, with funds going to [place].  $20.00  [place] to purchase (see email above) New PWR! Moves Class offering at [employer]!  Fridays 1-2 pm in March (likely to switch days and times after March).  Come try it out and see if PWR! Moves is a good fit for your exercise routine!  Contact Amy Marriott for details:  [email or web address] Hamil-Kerr Challenge Bike, Run, Walk for PD, PSP, MSA.    Saturday, April 8th at 8 am.  [place]   Proceeds go to offset costs of exercise programs locally.  To Register, visit website:  www.[email or web address]  [place]:  www.[email or web address] PD Health at Home continues:  Mindfulness Mondays, Expert Briefing Tuesdays, Wellness Wednesdays, Take Time Thursdays, Fitness Fridays  Upcoming Education:  Parkinson's and Medications:  [employer] Group.  Wednesday, March 8th at 1:00 pm. Freezing and Fall Prevention in Parkinson's.  Wednesday, April 12th at 1:00 pm Register for expert briefings Cytogeneticist) at WatchCalls.si [place] Chapter Parkinson's Symposium: Cognition Changes- a free in-person ([place], [place]) and online [employer]) event for people with Parkinson's and their loved ones. During this event we will explore new treatments and practical strategies for addressing these changes.  Saturday, April 1st, 10 am-1 pm.  Register at [employer].[email or web address]/ or contact [place] at [phone] or [email or web address].  Please check out their website to sign up for emails and see their full online offerings  [place]:  [email or web address]  Third Thursday Webinars:  On the third Thursday of every month at 12 p.m. ET, join our free live webinars to learn about various aspects of living with Parkinson's disease and our work to speed medical breakthroughs. Upcoming Webinar:  The Power of Women in Philanthropy.  Tues, March 28th at  12 pm. Check out additional information on their website to see their full online offerings  [employer]:  www.[email or web address] Upcoming Webinar:   Stay tuned Webinar Series:  Living with Parkinson's Meetup.   Third Thursdays of each month at 3 pm Care Partner Monthly Meetup.  With Robin Searing Phinney.  First Tuesday of each month, 2 pm Check out additional information  to Live Well Today on their  website  Parkinson and Movement Disorders (PMD) Alliance:  www.pmdalliance.org NeuroLife Online:  Online Education Events Sign up for emails, which are sent weekly to give you updates on programming and online offerings  Parkinson's Association of the [place]:  www.[email or web address] Information on online support groups, education events, and online exercises including Yoga, Parkinson's exercises and more-LOTS of information on links to PD resources and online events Virtual Support Group through Parkinson's Association of the [place]; next one is scheduled for Wednesday, *April 5th at 2 pm. *March meeting has been cancelled. (These are typically scheduled for the 1st Wednesday of the month at 2 pm).  Visit website for details. MOVEMENT AND EXERCISE OPPORTUNITIES Parkinson's DRUMMING Classes/Music Therapy with Doylene Canning:  This is a returning class and it's FREE!  2nd Mondays, continuing March 13th , 11:00 at the [place].  Contact *Misty Taylor-Paladino at [email or web address] or Doylene Canning at [phone] or [email or web address]  PWR! Moves Classes at [place].  Wednesdays 10 and 11 am.   NEW PWR! Moves Class offering at [employer].  Fridays 1-2 pm.  Contact Amy Marriott, PT [email or web address] if interested. Here is a link to the PWR!Moves classes on Zoom from [place] - Daily Mon-Sat at 10:00. Via Zoom, FREE and open to all.  There is also a link below via Facebook if you use that platform. AptDealers.si [email or web address]  Parkinson's Wellness Recovery (PWR! Moves)  www.pwr4life.org Info on the PWR! Virtual Experience:  You will have access to our expertise through  self-assessment, guided plans that start with the PD-specific fundamentals, educational content, tips, Q&A with an expert, and a growing Art therapist of PD-specific pre-recorded and live exercise classes of varying types and intensity - both physical and cognitive! If that is not enough, we offer 1:1 wellness consultations (in-person or virtual) to personalize your PWR! Research scientist (medical).  [place] Fridays:  As part of the PD Health @ Home program, this free video series focuses each week on one aspect of fitness designed to support people living with Parkinson's.  These weekly videos highlight the [place] recent fitness guidelines for people with Parkinson's disease. ModemGamers.si Dance for PD website is offering free, live-stream classes throughout the week, as well as links to [employer] of classes:  https://danceforparkinsons.org/ Dance for Parkinson's in-person class.  February 1-April 26, Wednesdays 4-5 pm.  Free class for people with Parkinson's disease, at [address] [place], [place].  Contact [phone] or [email or web address] to register Virtual dance and Pilates for Parkinson's classes: Click on the Community Tab> Parkinson's Movement Initiative Tab.  To register for classes and for more information, visit www.[email or web address].co.[email or web address] and click the community tab.  [employer] Parkinson's Cycling Classes  [employer]:  Thursdays @ Noon-Live classes at [employer] ([employer] at [email or web address] or [phone]) [employer] [employer]: Virtual Classes Mondays and Thursdays Jeanette Caprice classes Tuesday, Wednesday and Thursday (contact Tradewinds at [email or web address]  or [phone]) [place] Varied levels of classes are offered Mondays, Tuesdays and Thursdays at [employer].  Stretching with Verdis Frederickson weekly class is also offered for people with  Parkinson's To observe a class or for more information, call [phone] or email Hezzie Bump at [email or web address] .[email or web address] ADDITIONAL SUPPORT AND RESOURCES [facility]:Online Caregiver Education Opportunities:  www.well-springsolutions.org/caregiver-education/caregiver-support-group.  You may also contact Vickki Muff at [email or web address] or [phone].    [facility] Navigator:  [phone] program, a free service to help individuals and families through the journey  of determining care for older adults.  The Navigator is a Education officer, museum, Juanita Devincent, who will speak with a prospective client and/or loved ones to provide an assessment of the situation and a set of recommendations for a personalized care plan -- all free of charge, and whether [facility] offers the needed service or not. If the need is not a service we provide, we are well-connected with reputable programs in town that we can refer you to.  www.well-springsolutions.org or to speak with the Navigator, call [phone]

## 2021-11-06 ENCOUNTER — Telehealth: Payer: Self-pay

## 2021-11-06 NOTE — Telephone Encounter (Signed)
[facility] from [facility] Good morning!  Thank you for referring Mr. Adam Kane to [facility]. Unfortunately, we are not able to accept patient at this time due to staffing in this particular zip code.  ?Called patients daughter and she said she is working with PCP as well as going to speak with [facility] today so she asked me to wait before sending a new request to a different company at this time. She will let me know if I need to send anything else  ?

## 2021-12-23 ENCOUNTER — Ambulatory Visit: Payer: Medicare Other | Admitting: Neurology

## 2022-05-09 NOTE — Progress Notes (Unsigned)
Assessment/Plan:   1.  Parkinsons Disease  -take carbidopa/levodopa 25/100 CR, but move dosing to 2 tablets at 9:30 am/12:30pm/3:30pm    2.  ?RBD  -recently with fall out of the bed  -now with bedrail  3.  PDD  -doesn't drive and needs more activity  -[facility]  -discussed med but ultimately we decided to hold off on that.  I think that engagement would help much more than medication impact  -Met with my LCSW today.  4.  Dizziness  -orthostatics neg but BP and prostate med (tamsulosin) still may contribute, esp given propensity for Parkinsons Disease to contribute to Neurogenic Orthostatic Hypotension.   Subjective:   Adam Kane was seen today in follow up for Parkinsons disease.  My previous records were reviewed prior to todays visit as well as outside records available to me. Pt with daughter who supplements the hx.    no falls since last visit.  No hallucinations.  No near syncope.  [facility].  Current prescribed movement disorder medications: Carbidopa/levodopa 25/100 CR, 2 tablets at 9:30 am/12:30pm/3:30pm    PREVIOUS MEDICATIONS: Sinemet  ALLERGIES:   Allergies  Allergen Reactions   Dilaudid [Hydromorphone Hcl] Other (See Comments)    PASSES OUT   Morphine And Related Other (See Comments)    PASSES OUT    CURRENT MEDICATIONS:  Outpatient Encounter Medications as of 05/12/2022  Medication Sig   AMBULATORY NON FORMULARY MEDICATION Walker, 2 wheels, 2 tennis balls G20   atorvastatin (LIPITOR) 40 MG tablet Take 40 mg by mouth at bedtime.    Carbidopa-Levodopa ER (SINEMET CR) 25-100 MG tablet controlled release Take 2 tablets by mouth 3 (three) times daily. 9:30 am/12:30pm/3:30pm   cholecalciferol (VITAMIN D) 1000 UNITS tablet Take 2,000 Units by mouth daily.    digoxin (LANOXIN) 0.125 MG tablet Take 1 tablet (0.125 mg total) daily by mouth.   diltiazem (TIAZAC) 180 MG 24 hr capsule Take 1  capsule (180 mg total) by mouth daily.   docusate sodium (COLACE) 100 MG capsule Take 100 mg by mouth 2 (two) times daily.   ELIQUIS 5 MG TABS tablet Take 1 tablet by mouth 2 (two) times daily.   ezetimibe (ZETIA) 10 MG tablet Take 10 mg by mouth every morning.    omeprazole (PRILOSEC) 20 MG capsule Take 20 mg by mouth daily.    tamsulosin (FLOMAX) 0.4 MG CAPS capsule Take 0.4 mg by mouth daily.   vitamin B-12 (CYANOCOBALAMIN) 1000 MCG tablet Take 1,000 mcg by mouth daily.   No facility-administered encounter medications on file as of 05/12/2022.    Objective:   PHYSICAL EXAMINATION:    VITALS:   There were no vitals filed for this visit.  No data found.    GEN:  The patient appears stated age and is in NAD. HEENT:  Normocephalic, atraumatic.  The mucous membranes are moist. The superficial temporal arteries are without ropiness or tenderness. CV:  RRR Lungs:  CTAB Neck/HEME:  There are no carotid bruits bilaterally.  Neurological examination:  Orientation: The patient is alert and oriented to person/place Cranial nerves: There is good facial symmetry with facial hypomimia. The speech is fluent and clear. Soft palate rises symmetrically and there is no tongue deviation. Hearing is intact to conversational tone. Sensation: Sensation is intact to light touch throughout Motor: Strength is at least antigravity x4.  Movement examination: Tone: There is nl tone in the ue/le Abnormal movements:  there is bilateral UE Coordination:  There is mild decremation with RAM's, Gait and Station:  The patient's stride length is decreased and shuffling with the walker  I have reviewed and interpreted the following labs independently    Chemistry      Component Value Date/Time   NA 141 01/12/2018 1605   K 3.9 01/12/2018 1605   CL 107 01/12/2018 1605   CO2 26 01/12/2018 1605   BUN 16 01/12/2018 1605   CREATININE 1.01 01/12/2018 1605      Component Value Date/Time   CALCIUM 10.0  01/12/2018 1605   ALKPHOS 125 (H) 08/18/2014 1649   AST 27 08/18/2014 1649   ALT 24 08/18/2014 1649   BILITOT 1.1 08/18/2014 1649       Lab Results  Component Value Date   WBC 6.9 01/12/2018   HGB 16.3 01/12/2018   HCT 50.6 01/12/2018   MCV 88.9 01/12/2018   PLT 185 01/12/2018    No results found for: "TSH"   Total time spent on today's visit was *** minutes, including both face-to-face time and nonface-to-face time.  Time included that spent on review of records (prior notes available to me/labs/imaging if pertinent), discussing treatment and goals, answering patient's questions and coordinating care.  Cc:  Crist Infante, MD

## 2022-05-12 ENCOUNTER — Ambulatory Visit: Payer: Medicare Other | Admitting: Neurology

## 2022-05-12 ENCOUNTER — Encounter: Payer: Self-pay | Admitting: Neurology

## 2022-05-12 VITALS — BP 115/76 | HR 67 | Ht 76.0 in | Wt 226.0 lb

## 2022-05-12 DIAGNOSIS — G20 Parkinson's disease: Secondary | ICD-10-CM | POA: Diagnosis not present

## 2022-05-12 NOTE — Patient Instructions (Signed)
Local and Online Resources for Power over Parkinson's Group September 2023  LOCAL [place] PARKINSON'S GROUPS  Power over Parkinson's Group:   Power Over Parkinson's Patient Education Group will be Wednesday, September 13th-*Hybrid meting*- in person at [facility] location and via [facility] at 2 pm.   Upcoming Power over [employer] Meetings:  2nd Wednesdays of the month at 2 pm:  September 13th, October 11th, November 8th Contact Amy Marriott at [email or web address] if interested in participating in this group Parkinson's Care Partners Group:    3rd Mondays, Contact Misty Paladino Atypical Parkinsonian Patient Group:   4th Wednesdays, Contact Misty Paladino If you are interested in participating in these groups with Misty, please contact her directly for how to join those meetings.  Her contact information is [email or web address].    LOCAL EVENTS AND NEW OFFERINGS New PWR! Moves Dynegy Instructor-Led Classes offering at [employer]!  Wednesdays 1-2 pm.   Contact Vonna Kotyk at  [email or web address] or Caron Presume at [place], [email or web address] Dance for Parkinson 's classes will be on Tuesdays 9:30am-10:30am starting October 3-December 12 with a break the week of November 21 . Located in the December 04 which is in the first floor of the [employer]  ([place] for Parkinson's will be held on 2nd and 4th Mondays at 11:00am . First class will start  September 25th.  Located at the [place] ([place].) Through support from the [place] and Drumming for Parkinson's classes are free for both patients and caregivers.  Contact Misty Taylor-Paladino for more details about registering.  [place]:  www.[email or web address] PD Health at Home continues:  Mindfulness Mondays, Wellness  Wednesdays, Fitness Fridays  Upcoming Education:   Navigating Nutrition with PD.  Wednesday, Sept. 6th 1:00-2:00 pm Understanding Mind and Memory.  Wednesday, Sept. 20th 1:00-2:00 pm  Expert Briefing:    Parkinson's Disease and the Bladder.  Wednesday, Sept. 13th 1:00-2:00 pm Parkinson's and the Gut-Brain Connection.  Wednesday, Oct. 11th 1:00-2:00 pm Register for expert briefings (webinars) at 02-16-1974 Please check out their website to sign up for emails and see their full online offerings   [place]:  [email or web address]  Third Thursday Webinars:  On the third Thursday of every month at 12 p.m. ET, join our free live webinars to learn about various aspects of living with Parkinson's disease and our work to speed medical breakthroughs. Upcoming Webinar:  Stay tuned Check out additional information on their website to see their full online offerings  Wednesday:  www.[email or web address] Upcoming Webinar:   Stay tuned Webinar Series:  Living with Parkinson's Meetup.   Third Thursdays each month, 3 pm Care Partner Monthly Meetup.  With 10-16-1983 [place].  First Tuesday of each month, 2 pm Check out additional information to Live Well Today on their website  Parkinson and Movement Disorders (PMD) Alliance:  www.pmdalliance.org NeuroLife Online:  Online Education Events Sign up for emails, which are sent weekly to give you updates on programming and online offerings  Parkinson's Association of the [place]:  www.parkinsonassociation.org Information on online support groups, education events, and online exercises including Yoga, Parkinson's exercises and more-LOTS of information on links to PD resources and online events Virtual Support Group through Parkinson's Association of the [place]; next one is scheduled for Wednesday, October 4th at 2 pm. (No September meeting  due to the symposium.  These are typically scheduled for the 1st Wednesday  of the month at 2 pm).  Visit website for details. Register for "Caring for Parkinson's-Caring for You", 9th Annual Symposium.  In-person event in [place].  September 9th.  To register:  www.parkinsonassociation.org/symposium-registration/?blm_aid=45150 MOVEMENT AND EXERCISE OPPORTUNITIES PWR! Moves Classes at [place].  Wednesdays 10 and 11 am.   Contact Amy Gerrit Friends, PT [email or web address] if interested. NEW PWR! Moves Class offerings at [employer].  Wednesdays 1-2 pm.  Contact Vonna Kotyk at  [email or web address] or Caron Presume at [place],  [email or web address] Parkinson's Wellness Recovery (PWR! Moves)  www.pwr4life.org Info on the PWR! Virtual Experience:  You will have access to our expertise through self-assessment, guided plans that start with the PD-specific fundamentals, educational content, tips, Q&A with an expert, and a growing [employer] of PD-specific pre-recorded and live exercise classes of varying types and intensity - both physical and cognitive! If that is not enough, we offer 1:1 wellness consultations (in-person or virtual) to personalize your PWR! Art therapist.  [place] Fridays:  As part of the PD Health @ Home program, this free video series focuses each week on one aspect of fitness designed to support people living with Parkinson's.  These weekly videos highlight the [place] recent fitness guidelines for people with Parkinson's disease. [address] Dance for PD website is offering free, live-stream classes throughout the week, as well as links to ModemGamers.si of classes:  https://danceforparkinsons.org/ Virtual dance and Pilates for Parkinson's classes: Click on the Community Tab> Parkinson's Movement Initiative Tab.  To register for classes and for more  information, visit www.[employer] and click the "community" tab.  [employer] Parkinson's Cycling Classes  [employer]:  Thursdays @ Noon-Live classes at 10-16-1983 ([employer] at [email or web address] or [phone]) [employer]: Virtual Classes Mondays and Thursdays 10-16-1983 classes Tuesday, Wednesday and Thursday (contact [place] at [email or web address] .[email or web address]  or [phone]) [employer] Varied levels of classes are offered Tuesdays and Thursdays at [facility].  Stretching with [facility] weekly class is also offered for people with Parkinson's To observe a class or for more information, call [phone] or email [phone] at [email or web address] .[email or web address] ADDITIONAL SUPPORT AND RESOURCES [facility]:Online Caregiver Education Opportunities:  www.well-springsolutions.org/caregiver-education/caregiver-support-group.  You may also contact Hezzie Bump at [email or web address] or [phone].    Coping with Difficult Caregiver Emotions.  Wednesday, September 20th, 10:30 am-12.  [facility], [facility] Navigating the Maze of Senior Care Options.  Thursday, September 28th, 4-5:15 pm.  The [facility]. [facility] Navigator:  [facility] program, a free service to help individuals and families through the journey of determining care for older adults.  The "Navigator" is a [employer], [place], who will speak with a prospective client and/or loved ones to provide an assessment of the situation and a set of recommendations for a personalized care plan -- all free of charge, and whether [facility] offers the needed service or not. If the need is not a service we provide, we are well-connected with reputable programs in town that we can refer you to.  www.well-springsolutions.org or to speak with the Navigator, call [phone].

## 2022-07-11 ENCOUNTER — Encounter (HOSPITAL_COMMUNITY): Payer: Self-pay

## 2022-07-11 ENCOUNTER — Emergency Department (HOSPITAL_COMMUNITY): Payer: Medicare Other

## 2022-07-11 ENCOUNTER — Other Ambulatory Visit: Payer: Self-pay

## 2022-07-11 ENCOUNTER — Inpatient Hospital Stay (HOSPITAL_COMMUNITY)
Admission: EM | Admit: 2022-07-11 | Discharge: 2022-07-15 | DRG: 642 | Disposition: A | Discharge status: Skilled Nursing Facility | Payer: Medicare Other | Attending: Family Medicine | Admitting: Family Medicine

## 2022-07-11 DIAGNOSIS — R519 Headache, unspecified: Secondary | ICD-10-CM | POA: Diagnosis present

## 2022-07-11 DIAGNOSIS — Z66 Do not resuscitate: Secondary | ICD-10-CM | POA: Diagnosis not present

## 2022-07-11 DIAGNOSIS — Z951 Presence of aortocoronary bypass graft: Secondary | ICD-10-CM

## 2022-07-11 DIAGNOSIS — F0280 Dementia in other diseases classified elsewhere without behavioral disturbance: Secondary | ICD-10-CM | POA: Diagnosis present

## 2022-07-11 DIAGNOSIS — W19XXXA Unspecified fall, initial encounter: Secondary | ICD-10-CM | POA: Diagnosis present

## 2022-07-11 DIAGNOSIS — I480 Paroxysmal atrial fibrillation: Secondary | ICD-10-CM | POA: Diagnosis present

## 2022-07-11 DIAGNOSIS — E785 Hyperlipidemia, unspecified: Principal | ICD-10-CM | POA: Diagnosis present

## 2022-07-11 DIAGNOSIS — N3001 Acute cystitis with hematuria: Secondary | ICD-10-CM

## 2022-07-11 DIAGNOSIS — Z9181 History of falling: Secondary | ICD-10-CM

## 2022-07-11 DIAGNOSIS — N390 Urinary tract infection, site not specified: Secondary | ICD-10-CM | POA: Diagnosis not present

## 2022-07-11 DIAGNOSIS — Z79899 Other long term (current) drug therapy: Secondary | ICD-10-CM

## 2022-07-11 DIAGNOSIS — Z808 Family history of malignant neoplasm of other organs or systems: Secondary | ICD-10-CM

## 2022-07-11 DIAGNOSIS — Y92009 Unspecified place in unspecified non-institutional (private) residence as the place of occurrence of the external cause: Secondary | ICD-10-CM

## 2022-07-11 DIAGNOSIS — Z87891 Personal history of nicotine dependence: Secondary | ICD-10-CM

## 2022-07-11 DIAGNOSIS — W228XXA Striking against or struck by other objects, initial encounter: Secondary | ICD-10-CM | POA: Diagnosis present

## 2022-07-11 DIAGNOSIS — R32 Unspecified urinary incontinence: Secondary | ICD-10-CM | POA: Diagnosis present

## 2022-07-11 DIAGNOSIS — Z8546 Personal history of malignant neoplasm of prostate: Secondary | ICD-10-CM

## 2022-07-11 DIAGNOSIS — Z87442 Personal history of urinary calculi: Secondary | ICD-10-CM

## 2022-07-11 DIAGNOSIS — G20A1 Parkinson's disease without dyskinesia, without mention of fluctuations: Secondary | ICD-10-CM | POA: Diagnosis present

## 2022-07-11 DIAGNOSIS — Z803 Family history of malignant neoplasm of breast: Secondary | ICD-10-CM

## 2022-07-11 DIAGNOSIS — Z885 Allergy status to narcotic agent status: Secondary | ICD-10-CM

## 2022-07-11 DIAGNOSIS — B9620 Unspecified Escherichia coli [E. coli] as the cause of diseases classified elsewhere: Secondary | ICD-10-CM | POA: Diagnosis present

## 2022-07-11 DIAGNOSIS — I714 Abdominal aortic aneurysm, without rupture, unspecified: Secondary | ICD-10-CM | POA: Diagnosis present

## 2022-07-11 DIAGNOSIS — I2510 Atherosclerotic heart disease of native coronary artery without angina pectoris: Secondary | ICD-10-CM | POA: Diagnosis present

## 2022-07-11 DIAGNOSIS — I482 Chronic atrial fibrillation, unspecified: Secondary | ICD-10-CM | POA: Diagnosis present

## 2022-07-11 DIAGNOSIS — R531 Weakness: Secondary | ICD-10-CM | POA: Diagnosis present

## 2022-07-11 DIAGNOSIS — K219 Gastro-esophageal reflux disease without esophagitis: Secondary | ICD-10-CM | POA: Diagnosis present

## 2022-07-11 DIAGNOSIS — Z7901 Long term (current) use of anticoagulants: Secondary | ICD-10-CM

## 2022-07-11 DIAGNOSIS — I10 Essential (primary) hypertension: Secondary | ICD-10-CM | POA: Diagnosis present

## 2022-07-11 LAB — CBC WITH DIFFERENTIAL/PLATELET
Abs Immature Granulocytes: 0.04 10*3/uL (ref 0.00–0.07)
Basophils Absolute: 0 10*3/uL (ref 0.0–0.1)
Basophils Relative: 0 %
Eosinophils Absolute: 0 10*3/uL (ref 0.0–0.5)
Eosinophils Relative: 0 %
HCT: 39.2 % (ref 39.0–52.0)
Hemoglobin: 11.7 g/dL — ABNORMAL LOW (ref 13.0–17.0)
Immature Granulocytes: 1 %
Lymphocytes Relative: 8 %
Lymphs Abs: 0.7 10*3/uL (ref 0.7–4.0)
MCH: 24.6 pg — ABNORMAL LOW (ref 26.0–34.0)
MCHC: 29.8 g/dL — ABNORMAL LOW (ref 30.0–36.0)
MCV: 82.5 fL (ref 80.0–100.0)
Monocytes Absolute: 1.1 10*3/uL — ABNORMAL HIGH (ref 0.1–1.0)
Monocytes Relative: 13 %
Neutro Abs: 7 10*3/uL (ref 1.7–7.7)
Neutrophils Relative %: 78 %
Platelets: 199 10*3/uL (ref 150–400)
RBC: 4.75 MIL/uL (ref 4.22–5.81)
RDW: 16.3 % — ABNORMAL HIGH (ref 11.5–15.5)
WBC: 8.9 10*3/uL (ref 4.0–10.5)
nRBC: 0 % (ref 0.0–0.2)

## 2022-07-11 LAB — LACTIC ACID, PLASMA: Lactic Acid, Venous: <0.3 mmol/L — ABNORMAL LOW (ref 0.5–1.9)

## 2022-07-11 LAB — COMPREHENSIVE METABOLIC PANEL
ALT: 7 U/L (ref 0–44)
AST: 18 U/L (ref 15–41)
Albumin: 3.9 g/dL (ref 3.5–5.0)
Alkaline Phosphatase: 95 U/L (ref 38–126)
Anion gap: 7 (ref 5–15)
BUN: 26 mg/dL — ABNORMAL HIGH (ref 8–23)
CO2: 24 mmol/L (ref 22–32)
Calcium: 9.7 mg/dL (ref 8.9–10.3)
Chloride: 106 mmol/L (ref 98–111)
Creatinine, Ser: 1.01 mg/dL (ref 0.61–1.24)
GFR, Estimated: >60 mL/min (ref >60)
Glucose, Bld: 135 mg/dL — ABNORMAL HIGH (ref 70–99)
Potassium: 3.7 mmol/L (ref 3.5–5.1)
Sodium: 137 mmol/L (ref 135–145)
Total Bilirubin: 0.9 mg/dL (ref 0.3–1.2)
Total Protein: 7.2 g/dL (ref 6.5–8.1)

## 2022-07-11 LAB — CK: Total CK: 95 U/L (ref 49–397)

## 2022-07-11 LAB — CBG MONITORING, ED: Glucose-Capillary: 138 mg/dL — ABNORMAL HIGH (ref 70–99)

## 2022-07-11 NOTE — ED Notes (Signed)
When moving the patient up in the bed with 3 person assist. Pt.'s head hit the corner of the wall. Neuro assessment done. Provider notified.

## 2022-07-11 NOTE — ED Provider Notes (Signed)
  Assumed care at shift change.  See prior notes for full H&P.  Briefly, 88 y.o. M here due to change in mental status.  Family reports he became unsteady on his feet this afternoon. Family brought him dinner and found him in the floor, unclear how he fell or if LOC.  Since then, has been more confused and not quite himself.  CT head/neck obtained which is negative for acute findings.  Plan:  labs pending.  Low threshold for admission given change in mental status.  11:00 PM Notified by staff that while moving him up in bed he struck his head on the wall.  There was no LOC but seemed to be fairly hard impact.  He does appear to have hematoma noted to right parietal scalp that is locally tender, hematoma developing.  He does report headache.  Will repeat CT head given he is anticoagulated.  Repeat head CT without acute findings.  Labs overall reassuring without leukocytosis or electrolyte derangement.  CK is normal.  Normal lactate x2.  Awaiting UA.  2:45 AM Notified by RN that family is very upset about patient being in the hallway.  I have explained that due to admitted patient's boarding in the ED, we do not have any available rooms at the moment.  We are attempting to get him cleaned up, perform in and out.  Daughter is very concerned at his current state as this was a vast change from yesterday-- [facility] as normal, ambulatory with walker and today has seemed "off" and unable to get up.  He lives at home with wife who is also elderly and cannot independently care for him.  Family does live close-by but no able to be with him 24/7.  Suspect he will need admission, but awaiting UA.  3:37 AM UA appears infectious, many bacteria.  Culture pending.  Family has been updated.  Given his acute change from baseline, will admit.  Started IV Rocephin.    Discussed with Dr. Alcario Drought-- has evaluated in the ED and will admit.   Larene Pickett,  PA-C 07/12/22 0544    Lacretia Leigh, MD 07/14/22 1426

## 2022-07-11 NOTE — ED Triage Notes (Signed)
Pt. BIB [facility] for an unwitnessed fall. Pt. On the floor for 3hr before being assisted up by EMS. He has a hx of Parkinson's Disease. Per EMS, pt. Able to get around with a walker, but unable to move his legs today. EMS also states that pt. Has had appetite changes and has had a new onset of incontinence. Family requesting resources for skilled nursing facility placement.

## 2022-07-11 NOTE — ED Provider Notes (Signed)
[facility] DEPT Provider Note   CSN: [id number] Arrival date & time: 07/11/22  2001     History {Add pertinent medical, surgical, social history, OB history to HPI:1} Chief Complaint  Patient presents with   Mahmood Boehringer is a 88 y.o. male.  Patient with history of afib on Elliquis, hypertension, Parkinson's disease, AAA, CAD, hyperlipidemia presents today with complaints of fall. According to patient's daughter who is present at bedside, patient lives at home with his wife who is bedbound. She was with the patient until 2 pm and states that he was slightly more unsteady on his feet and did not eat lunch today which is very unusual. She then left at 2pm and then returned around 5pm to bring him dinner and found him laying on the ground. During that time, the patients son attempted to call him but did not get a response which is unusual for him. Patients family called EMS when they found him and EMS was able to get him into a chair. Family at that time refused hospital transport as patient had no complaints. However, since that time, he has been more confused and unable to walk. He also had 1 episode of urinary incontinence which is not normal for him. He is normally able to ambulate with a walker. Patient denies any pain, numbness/tingling, or other complaints at this time. He is unable to recall any events from his fall.  Of note, discussed care plan with daughter who states that the patient does not have home health or PT/OT in place.  Level 5 caveat --- dementia  The history is provided by the patient. No language interpreter was used.  Fall      Home Medications Prior to Admission medications   Medication Sig Start Date End Date Taking? Authorizing Provider  AMBULATORY NON FORMULARY MEDICATION Walker, 2 wheels, 2 tennis balls G20 08/30/20   Tat, Eustace Quail, DO  atorvastatin (LIPITOR) 40 MG tablet Take 40 mg by mouth at bedtime.      [provider]  Carbidopa-Levodopa ER (SINEMET CR) 25-100 MG tablet controlled release Take 2 tablets by mouth 3 (three) times daily. 9:30 am/12:30pm/3:30pm 11/05/21   Tat, Eustace Quail, DO  cholecalciferol (VITAMIN D) 1000 UNITS tablet Take 2,000 Units by mouth daily.     [provider]  digoxin (LANOXIN) 0.125 MG tablet Take 1 tablet (0.125 mg total) daily by mouth. 07/10/17   Croitoru, Mihai, MD  diltiazem (TIAZAC) 180 MG 24 hr capsule Take 1 capsule (180 mg total) by mouth daily. 07/15/18   Croitoru, Mihai, MD  docusate sodium (COLACE) 100 MG capsule Take 100 mg by mouth 2 (two) times daily.    [provider]  ELIQUIS 5 MG TABS tablet Take 1 tablet by mouth 2 (two) times daily. 01/16/21   [provider]  ezetimibe (ZETIA) 10 MG tablet Take 10 mg by mouth every morning.     [provider]  omeprazole (PRILOSEC) 20 MG capsule Take 20 mg by mouth daily.     [provider]  tamsulosin (FLOMAX) 0.4 MG CAPS capsule Take 0.4 mg by mouth daily. Patient not taking: Reported on 05/12/2022 08/20/20   [provider]  vitamin B-12 (CYANOCOBALAMIN) 1000 MCG tablet Take 1,000 mcg by mouth daily.    [provider]      Allergies    Dilaudid [hydromorphone hcl] and Morphine and related    Review of Systems   Review of Systems  Unable to perform ROS: Dementia  All other systems reviewed and are negative.   Physical Exam Updated Vital Signs BP (!) 141/84 (BP Location: Left Arm)   Pulse 93   Temp 98.8 F (37.1 C) (Oral)   Resp 16   SpO2 98%  Physical Exam Vitals and nursing note reviewed.  Constitutional:      General: He is not in acute distress.    Appearance: Normal appearance. He is normal weight. He is not ill-appearing, toxic-appearing or diaphoretic.  HENT:     Head: Normocephalic and atraumatic.     Comments: No battle's sign or racoon eyes Eyes:     Extraocular Movements: Extraocular movements intact.      Pupils: Pupils are equal, round, and reactive to light.  Neck:     Comments: No midline cervical spine tenderness Cardiovascular:     Rate and Rhythm: Normal rate and regular rhythm.     Heart sounds: Normal heart sounds.  Pulmonary:     Effort: Pulmonary effort is normal. No respiratory distress.     Breath sounds: Normal breath sounds.  Abdominal:     General: Abdomen is flat.     Palpations: Abdomen is soft.  Musculoskeletal:        General: Normal range of motion.     Cervical back: Normal range of motion and neck supple.     Right lower leg: No edema.     Left lower leg: No edema.     Comments: No MSK tenderness to palpation throughout. No bruising or wounds.   No tenderness to palpation of cervical, thoracic, or lumbar spine. No stepoffs, lesions, or deformity.    Skin:    General: Skin is warm and dry.     Comments: Skin is warm throughout  Neurological:     General: No focal deficit present.     Mental Status: He is alert.     GCS: GCS eye subscore is 4. GCS verbal subscore is 5. GCS motor subscore is 6.     Cranial Nerves: No cranial nerve deficit.     Sensory: Sensation is intact.     Motor: Motor function is intact.     Coordination: Coordination is intact.     Gait: Gait is intact.     Comments: Bilateral upper and lower extremity weakness, moving all extremities equally.   Psychiatric:        Mood and Affect: Mood normal.        Behavior: Behavior normal.    ED Results / Procedures / Treatments   Labs (all labs ordered are listed, but only abnormal results are displayed) Labs Reviewed  CULTURE, BLOOD (ROUTINE X 2)  CULTURE, BLOOD (ROUTINE X 2)  COMPREHENSIVE METABOLIC PANEL  CBC WITH DIFFERENTIAL/PLATELET  URINALYSIS, ROUTINE W REFLEX MICROSCOPIC  CK  LACTIC ACID, PLASMA  LACTIC ACID, PLASMA  CBG MONITORING, ED    EKG None  Radiology No results found.  Procedures Procedures  {Document cardiac monitor, telemetry assessment procedure when  appropriate:1}  Medications Ordered in ED Medications - No data to display  ED Course/ Medical Decision Making/ A&P                           Medical Decision Making Amount and/or Complexity of Data Reviewed Labs: ordered. Radiology: ordered.   This patient is a 88 y.o. male who presents to the ED for concern of fall, altered mental status, this involves an extensive number of  treatment options, and is a complaint that carries with it a high risk of complications and morbidity. The emergent differential diagnosis prior to evaluation includes, but is not limited to,  sepsis, trauma, UTI, worsening Parkinson's.   This is not an exhaustive differential.   Past Medical History / Co-morbidities / Social History: Hx Parkinson's, afib on Elliquis  Patient also lives at home with his wife who is bedbound. No home health or PT/OT in place  Physical Exam: Physical exam performed. The pertinent findings include: bilateral weakness but no focal deficits or areas of injury or tenderness. Skin is warm.  Lab Tests: I ordered, and personally interpreted labs.  The pertinent results include:  CMP, CBC, UA, CK, Lactic, blood cultures. All pending at shift change.   Imaging Studies: I ordered imaging studies including CT head, CT cervical spine. I independently visualized and interpreted imaging which showed   1. No acute intracranial abnormality. 2. No acute displaced fracture or traumatic listhesis of the cervical spine. 3. Multilevel moderate severe degenerative changes of the spine. Grade 1 anterolisthesis of C4 on C5 and C5 on C6. Associated severe osseous neural foraminal stenosis at the right C3-C4.  I agree with the radiologist interpretation.   Cardiac Monitoring:  EKG ordered but unfortunately not obtained at shift change due to patient being in the hallway    Disposition:  Patient presents today with complaints of unwitnessed fall earlier today and increased confusion. He  denies any complaints and does not have any focal deficits. He is on Elliquis. CT head and neck obtained and are reassuring for acute findings. Patient is warm, some concern for infection. Labs are pending at shift change. 1 episode of urinary incontinence, potentially infectious source. Low suspicion for traumatic spine injury causing symptoms given no tenderness or numbness/tingling. Low threshold for admission.  Care handoff to Quincy Carnes, PA-C at shift change. Please see their note for further evaluation and dispo   Final Clinical Impression(s) / ED Diagnoses Final diagnoses:  Fall, initial encounter    Rx / DC Orders ED Discharge Orders     None

## 2022-07-12 DIAGNOSIS — R32 Unspecified urinary incontinence: Secondary | ICD-10-CM | POA: Diagnosis present

## 2022-07-12 DIAGNOSIS — Z79899 Other long term (current) drug therapy: Secondary | ICD-10-CM | POA: Diagnosis not present

## 2022-07-12 DIAGNOSIS — Z885 Allergy status to narcotic agent status: Secondary | ICD-10-CM | POA: Diagnosis not present

## 2022-07-12 DIAGNOSIS — G20B1 Parkinson's disease with dyskinesia, without mention of fluctuations: Secondary | ICD-10-CM

## 2022-07-12 DIAGNOSIS — E785 Hyperlipidemia, unspecified: Secondary | ICD-10-CM | POA: Diagnosis present

## 2022-07-12 DIAGNOSIS — R531 Weakness: Secondary | ICD-10-CM | POA: Diagnosis present

## 2022-07-12 DIAGNOSIS — Y92009 Unspecified place in unspecified non-institutional (private) residence as the place of occurrence of the external cause: Secondary | ICD-10-CM | POA: Diagnosis not present

## 2022-07-12 DIAGNOSIS — F0280 Dementia in other diseases classified elsewhere without behavioral disturbance: Secondary | ICD-10-CM | POA: Diagnosis present

## 2022-07-12 DIAGNOSIS — Z951 Presence of aortocoronary bypass graft: Secondary | ICD-10-CM | POA: Diagnosis not present

## 2022-07-12 DIAGNOSIS — Z66 Do not resuscitate: Secondary | ICD-10-CM | POA: Diagnosis not present

## 2022-07-12 DIAGNOSIS — Z9181 History of falling: Secondary | ICD-10-CM | POA: Diagnosis not present

## 2022-07-12 DIAGNOSIS — R011 Cardiac murmur, unspecified: Secondary | ICD-10-CM | POA: Diagnosis not present

## 2022-07-12 DIAGNOSIS — Z803 Family history of malignant neoplasm of breast: Secondary | ICD-10-CM | POA: Diagnosis not present

## 2022-07-12 DIAGNOSIS — I482 Chronic atrial fibrillation, unspecified: Secondary | ICD-10-CM

## 2022-07-12 DIAGNOSIS — I714 Abdominal aortic aneurysm, without rupture, unspecified: Secondary | ICD-10-CM | POA: Diagnosis present

## 2022-07-12 DIAGNOSIS — N3000 Acute cystitis without hematuria: Secondary | ICD-10-CM | POA: Diagnosis not present

## 2022-07-12 DIAGNOSIS — Z7901 Long term (current) use of anticoagulants: Secondary | ICD-10-CM | POA: Diagnosis not present

## 2022-07-12 DIAGNOSIS — I2510 Atherosclerotic heart disease of native coronary artery without angina pectoris: Secondary | ICD-10-CM | POA: Diagnosis present

## 2022-07-12 DIAGNOSIS — Z8546 Personal history of malignant neoplasm of prostate: Secondary | ICD-10-CM | POA: Diagnosis not present

## 2022-07-12 DIAGNOSIS — B9620 Unspecified Escherichia coli [E. coli] as the cause of diseases classified elsewhere: Secondary | ICD-10-CM | POA: Diagnosis present

## 2022-07-12 DIAGNOSIS — W19XXXA Unspecified fall, initial encounter: Secondary | ICD-10-CM | POA: Diagnosis present

## 2022-07-12 DIAGNOSIS — I10 Essential (primary) hypertension: Secondary | ICD-10-CM

## 2022-07-12 DIAGNOSIS — I480 Paroxysmal atrial fibrillation: Secondary | ICD-10-CM | POA: Diagnosis present

## 2022-07-12 DIAGNOSIS — W228XXA Striking against or struck by other objects, initial encounter: Secondary | ICD-10-CM | POA: Diagnosis present

## 2022-07-12 DIAGNOSIS — N390 Urinary tract infection, site not specified: Secondary | ICD-10-CM | POA: Diagnosis present

## 2022-07-12 DIAGNOSIS — G20A1 Parkinson's disease without dyskinesia, without mention of fluctuations: Secondary | ICD-10-CM | POA: Diagnosis present

## 2022-07-12 DIAGNOSIS — Z808 Family history of malignant neoplasm of other organs or systems: Secondary | ICD-10-CM | POA: Diagnosis not present

## 2022-07-12 DIAGNOSIS — K219 Gastro-esophageal reflux disease without esophagitis: Secondary | ICD-10-CM | POA: Diagnosis present

## 2022-07-12 DIAGNOSIS — Z87891 Personal history of nicotine dependence: Secondary | ICD-10-CM | POA: Diagnosis not present

## 2022-07-12 DIAGNOSIS — Z87442 Personal history of urinary calculi: Secondary | ICD-10-CM | POA: Diagnosis not present

## 2022-07-12 DIAGNOSIS — R519 Headache, unspecified: Secondary | ICD-10-CM | POA: Diagnosis present

## 2022-07-12 LAB — URINALYSIS, ROUTINE W REFLEX MICROSCOPIC
Bilirubin Urine: NEGATIVE
Glucose, UA: NEGATIVE mg/dL
Ketones, ur: NEGATIVE mg/dL
Nitrite: NEGATIVE
Protein, ur: NEGATIVE mg/dL
Specific Gravity, Urine: 1.023 (ref 1.005–1.030)
pH: 5 (ref 5.0–8.0)

## 2022-07-12 LAB — LACTIC ACID, PLASMA: Lactic Acid, Venous: 1.2 mmol/L (ref 0.5–1.9)

## 2022-07-12 MED ORDER — CARBIDOPA-LEVODOPA ER 25-100 MG PO TBCR
2.0000 | EXTENDED_RELEASE_TABLET | ORAL | Status: DC
  Administered 2022-07-12 – 2022-07-15 (×8): 2 via ORAL
  Filled 2022-07-12 (×13): qty 2

## 2022-07-12 MED ORDER — DILTIAZEM HCL ER BEADS 180 MG PO CP24: 180.0000 mg | ORAL_CAPSULE | Freq: Every day | ORAL | Status: DC

## 2022-07-12 MED ORDER — ACETAMINOPHEN 650 MG RE SUPP: 650.0000 mg | Freq: Four times a day (QID) | RECTAL | Status: DC | PRN

## 2022-07-12 MED ORDER — APIXABAN 5 MG PO TABS
5.0000 mg | ORAL_TABLET | Freq: Two times a day (BID) | ORAL | Status: DC
  Administered 2022-07-12 – 2022-07-15 (×7): 5 mg via ORAL
  Filled 2022-07-12 (×7): qty 1

## 2022-07-12 MED ORDER — ACETAMINOPHEN 325 MG PO TABS
650.0000 mg | ORAL_TABLET | Freq: Four times a day (QID) | ORAL | Status: DC | PRN
  Administered 2022-07-14 (×3): 650 mg via ORAL
  Filled 2022-07-12 (×3): qty 2

## 2022-07-12 MED ORDER — SODIUM CHLORIDE 0.9 % IV SOLN
1.0000 g | INTRAVENOUS | Status: DC
  Administered 2022-07-13 – 2022-07-14 (×2): 1 g via INTRAVENOUS
  Filled 2022-07-12 (×2): qty 10

## 2022-07-12 MED ORDER — SODIUM CHLORIDE 0.9 % IV SOLN
1.0000 g | Freq: Once | INTRAVENOUS | Status: AC
  Administered 2022-07-12: 1 g via INTRAVENOUS
  Filled 2022-07-12: qty 10

## 2022-07-12 MED ORDER — ATORVASTATIN CALCIUM 40 MG PO TABS
40.0000 mg | ORAL_TABLET | Freq: Every day | ORAL | Status: DC
  Administered 2022-07-12 – 2022-07-14 (×3): 40 mg via ORAL
  Filled 2022-07-12 (×3): qty 1

## 2022-07-12 MED ORDER — ONDANSETRON HCL 4 MG PO TABS: 4.0000 mg | ORAL_TABLET | Freq: Four times a day (QID) | ORAL | Status: DC | PRN

## 2022-07-12 MED ORDER — DILTIAZEM HCL ER COATED BEADS 180 MG PO CP24
180.0000 mg | ORAL_CAPSULE | Freq: Every day | ORAL | Status: DC
  Administered 2022-07-12 – 2022-07-15 (×4): 180 mg via ORAL
  Filled 2022-07-12 (×4): qty 1

## 2022-07-12 MED ORDER — SODIUM CHLORIDE 0.9 % IV SOLN: INTRAVENOUS | Status: DC

## 2022-07-12 MED ORDER — VITAMIN B-12 1000 MCG PO TABS
1000.0000 ug | ORAL_TABLET | Freq: Every day | ORAL | Status: DC
  Administered 2022-07-12 – 2022-07-15 (×4): 1000 ug via ORAL
  Filled 2022-07-12 (×4): qty 1

## 2022-07-12 MED ORDER — PANTOPRAZOLE SODIUM 40 MG PO TBEC
40.0000 mg | DELAYED_RELEASE_TABLET | Freq: Every day | ORAL | Status: DC
  Administered 2022-07-12 – 2022-07-15 (×4): 40 mg via ORAL
  Filled 2022-07-12 (×4): qty 1

## 2022-07-12 MED ORDER — VITAMIN D 25 MCG (1000 UNIT) PO TABS
2000.0000 [IU] | ORAL_TABLET | Freq: Every day | ORAL | Status: DC
  Administered 2022-07-12 – 2022-07-15 (×4): 2000 [IU] via ORAL
  Filled 2022-07-12 (×4): qty 2

## 2022-07-12 MED ORDER — EZETIMIBE 10 MG PO TABS
10.0000 mg | ORAL_TABLET | Freq: Every morning | ORAL | Status: DC
  Administered 2022-07-12 – 2022-07-15 (×4): 10 mg via ORAL
  Filled 2022-07-12 (×4): qty 1

## 2022-07-12 MED ORDER — DOCUSATE SODIUM 100 MG PO CAPS
100.0000 mg | ORAL_CAPSULE | Freq: Every day | ORAL | Status: DC
  Administered 2022-07-12 – 2022-07-15 (×4): 100 mg via ORAL
  Filled 2022-07-12 (×4): qty 1

## 2022-07-12 MED ORDER — ONDANSETRON HCL 4 MG/2ML IJ SOLN: 4.0000 mg | Freq: Four times a day (QID) | INTRAMUSCULAR | Status: DC | PRN

## 2022-07-12 MED ORDER — DIGOXIN 125 MCG PO TABS
0.1250 mg | ORAL_TABLET | Freq: Every day | ORAL | Status: DC
  Administered 2022-07-12 – 2022-07-15 (×4): 0.125 mg via ORAL
  Filled 2022-07-12 (×4): qty 1

## 2022-07-12 NOTE — Progress Notes (Signed)
This is a pleasant 88 year old gentleman with history of atrial fibrillation on Eliquis, Parkinson disease, hypertension and CAD status post CABG was admitted under hospitalist service early morning after midnight due to worsening generalized weakness and suspicion of possible UTI.  Patient received a dose of Rocephin.  Patient seen and examined in the ED, daughter and her husband is at the bedside.  Patient is slightly more alert compared to yesterday, daughter verifies.  Patient has no specific complaint.  He is worried patient is at his baseline as well.  It is very well possibility that he has UTI causing significant weakness and a patient with known Parkinson's disease.  I will resume Rocephin, follow culture.  We will order PT OT.

## 2022-07-12 NOTE — Assessment & Plan Note (Addendum)
UA questionable for UTI, pt with generalized weakness reportedly worse than baseline and 1 episode of urinary incontinence.  No SIRS. UCx pending Empiric rocephin PT/OT SW consult for SNF placement

## 2022-07-12 NOTE — H&P (Signed)
History and Physical    Patient: ANUEL SITTER OBS:[id number] DOB: 25-Oct-1932 DOA: 07/11/2022 DOS: the patient was seen and examined on 07/12/2022 PCP: Crist Infante, MD  Patient coming from: Home  Chief Complaint:  Chief Complaint  Patient presents with   Fall   HPI: Killilea KITTEL is a 88 y.o. male with medical history significant of A.Fib on eliquis, PD, HTN, CAD s/p CABG.  Pt presents to ED with increased unsteadiness, generalized weakness.  According to patient's daughter who is present at bedside, patient lives at home with his wife who is bedbound. She was with the patient until 2 pm and states that he was slightly more unsteady on his feet and did not eat lunch today which is very unusual. She then left at 2pm and then returned around 5pm to bring him dinner and found him laying on the ground. During that time, the patients son attempted to call him but did not get a response which is unusual for him. Patients family called EMS when they found him and EMS was able to get him into a chair. Family at that time refused hospital transport as patient had no complaints. However, since that time, he has been more confused and unable to walk. He also had 1 episode of urinary incontinence which is not normal for him. He is normally able to ambulate with a walker. Patient denies any pain, numbness/tingling, or other complaints at this time. He is unable to recall any events from his fall.   Patients daughter indicates to me that his current level of responsiveness to questioning and mental status is essentially his baseline.  Currently lives at home independently, no home health nor PT/OT in place.   Review of Systems: As mentioned in the history of present illness. All other systems reviewed and are negative. Past Medical History:  Diagnosis Date   A-fib [facility])    Abdominal aortic aneurysm (AAA), 30-34 mm diameter (HCC)    Arthritis    CAD (coronary artery disease)    Cancer (HCC)     prostate cancer 2009   Chronic kidney disease    kidney stone   Complication of anesthesia    if given anesthesia too quickly experiences nausea and vomiting    Diverticulosis of colon without hemorrhage    Dyslipidemia    Dysrhythmia    Early-onset Parkinson's disease    GERD (gastroesophageal reflux disease)    Heart murmur    childhood   History of kidney stones    PONV (postoperative nausea and vomiting)    S/P CABG (coronary artery bypass graft) 2001   Shortness of breath    mild exertion due to afib   Systemic hypertension    Urinary leakage    Past Surgical History:  Procedure Laterality Date   APPENDECTOMY     CARDIAC CATHETERIZATION  02/20/94   CATARACT EXTRACTION, BILATERAL     CHOLECYSTECTOMY     CORONARY ARTERY BYPASS GRAFT  2001   LIMA to LAD,SVG to PDA   CORONARY ARTERY BYPASS GRAFT     2 vessel 2001   CYSTOSCOPY WITH RETROGRADE PYELOGRAM, URETEROSCOPY AND STENT PLACEMENT Left 05/18/2014   Procedure: CYSTOSCOPY WITH RETROGRADE PYELOGRAM, URETEROSCOPY AND STENT PLACEMENT;  Surgeon: Raynelle Bring, MD;  Location: [facility];  Service: Urology;  Laterality: Left;   CYSTOSCOPY WITH RETROGRADE PYELOGRAM, URETEROSCOPY AND STENT PLACEMENT Right 08/21/2014   Procedure: CYSTOSCOPY WITH RETROGRADE PYELOGRAM, URETEROSCOPY AND STENT PLACEMENT WITH STONE EXTRACTION basket;  Surgeon: Malka So, MD;  Location: [facility];  Service: Urology;  Laterality: Right;   HERNIA REPAIR     HOLMIUM LASER APPLICATION Left 05/22/1940   Procedure: HOLMIUM LASER APPLICATION;  Surgeon: Raynelle Bring, MD;  Location: [facility];  Service: Urology;  Laterality: Left;   LUMBAR LAMINECTOMY/DECOMPRESSION MICRODISCECTOMY Left 03/07/2013   Procedure: LUMBAR FOUR-FIVE EXTRAFORAMINAL LUMBAR LAMINECTOMY/DECOMPRESSION MICRODISCECTOMY 1 LEVEL;  Surgeon: Charlie Pitter, MD;  Location: [facility];  Service: Neurosurgery;  Laterality: Left;  Left lumbar four-five extraforaminal microdiskectomy   LUMBAR  LAMINECTOMY/DECOMPRESSION MICRODISCECTOMY Left 01/15/2018   Procedure: Microdiscectomy - left - Lumbar Four -Lumbar Five extraforaminal;  Surgeon: Earnie Larsson, MD;  Location: [facility];  Service: Neurosurgery;  Laterality: Left;  Microdiscectomy - left - Lumbar Four -Lumbar Five extraforaminal   PROSTATECTOMY     TONSILLECTOMY     Social History:  reports that he quit smoking about 48 years ago. His smoking use included cigarettes. He has never used smokeless tobacco. He reports that he does not drink alcohol and does not use drugs.  Allergies  Allergen Reactions   Dilaudid [Hydromorphone Hcl] Other (See Comments)    PASSES OUT   Morphine And Related Other (See Comments)    PASSES OUT    Family History  Problem Relation Age of Onset   Cancer Mother        bone   Breast cancer Mother    AAA (abdominal aortic aneurysm) Father    Throat cancer Father        mouth    Prior to Admission medications   Medication Sig Start Date End Date Taking? Authorizing Provider  atorvastatin (LIPITOR) 40 MG tablet Take 40 mg by mouth at bedtime.    Yes [provider]  Carbidopa-Levodopa ER (SINEMET CR) 25-100 MG tablet controlled release Take 2 tablets by mouth 3 (three) times daily. 9:30 am/12:30pm/3:30pm 11/05/21  Yes Tat, Eustace Quail, DO  cholecalciferol (VITAMIN D) 1000 UNITS tablet Take 2,000 Units by mouth daily.    Yes [provider]  digoxin (LANOXIN) 0.125 MG tablet Take 1 tablet (0.125 mg total) daily by mouth. 07/10/17  Yes Croitoru, Mihai, MD  diltiazem (TIAZAC) 180 MG 24 hr capsule Take 1 capsule (180 mg total) by mouth daily. 07/15/18  Yes Croitoru, Mihai, MD  docusate sodium (COLACE) 100 MG capsule Take 100 mg by mouth daily.   Yes [provider]  ELIQUIS 5 MG TABS tablet Take 1 tablet by mouth 2 (two) times daily. 01/16/21  Yes [provider]  ezetimibe (ZETIA) 10 MG tablet Take 10 mg by mouth every morning.    Yes [provider]  omeprazole  (PRILOSEC) 20 MG capsule Take 20 mg by mouth daily.    Yes [provider]  vitamin B-12 (CYANOCOBALAMIN) 1000 MCG tablet Take 1,000 mcg by mouth daily.   Yes [provider]  AMBULATORY NON FORMULARY MEDICATION Walker, 2 wheels, 2 tennis balls G20 08/30/20   TatEustace Quail, DO    Physical Exam: Vitals:   07/11/22 2232 07/11/22 2310 07/12/22 0040 07/12/22 0317  BP: 135/81 127/89 129/82 135/85  Pulse: 96 93 98 72  Resp: (!) '25 16 16 16  '$ Temp: 99 F (37.2 C)   98.6 F (37 C)  TempSrc: Oral   Oral  SpO2: 97% 94% 96% 96%   Constitutional: NAD, calm, comfortable Eyes: PERRL, lids and conjunctivae normal ENMT: Mucous membranes are moist. Posterior pharynx clear of any exudate or lesions.Normal dentition.  Neck: normal, supple, no masses, no thyromegaly Respiratory:  clear to auscultation bilaterally, no wheezing, no crackles. Normal respiratory effort. No accessory muscle use.  Cardiovascular: Regular rate and rhythm, no murmurs / rubs / gallops. No extremity edema. 2+ pedal pulses. No carotid bruits.  Abdomen: no tenderness, no masses palpated. No hepatosplenomegaly. Bowel sounds positive.  Musculoskeletal: no clubbing / cyanosis. No joint deformity upper and lower extremities. Good ROM, no contractures. Normal muscle tone.  Skin: no rashes, lesions, ulcers. No induration Neurologic: Grossly non-focal, has mild parkinsonian tremor Psychiatric: at least moderate dementia  Data Reviewed:     CT head = neg for acute findings.  Urinalysis    Component Value Date/Time   COLORURINE YELLOW 07/12/2022 0302   APPEARANCEUR HAZY (A) 07/12/2022 0302   LABSPEC 1.023 07/12/2022 0302   PHURINE 5.0 07/12/2022 0302   GLUCOSEU NEGATIVE 07/12/2022 0302   HGBUR SMALL (A) 07/12/2022 0302   BILIRUBINUR NEGATIVE 07/12/2022 0302   KETONESUR NEGATIVE 07/12/2022 0302   PROTEINUR NEGATIVE 07/12/2022 0302   UROBILINOGEN 1.0 08/18/2014 1751   NITRITE NEGATIVE 07/12/2022 0302    LEUKOCYTESUR SMALL (A) 07/12/2022 0302    CBC    Component Value Date/Time   WBC 8.9 07/11/2022 2213   RBC 4.75 07/11/2022 2213   HGB 11.7 (L) 07/11/2022 2213   HCT 39.2 07/11/2022 2213   PLT 199 07/11/2022 2213   MCV 82.5 07/11/2022 2213   MCH 24.6 (L) 07/11/2022 2213   MCHC 29.8 (L) 07/11/2022 2213   RDW 16.3 (H) 07/11/2022 2213   LYMPHSABS 0.7 07/11/2022 2213   MONOABS 1.1 (H) 07/11/2022 2213   EOSABS 0.0 07/11/2022 2213   BASOSABS 0.0 07/11/2022 2213   CMP     Component Value Date/Time   NA 137 07/11/2022 2213   K 3.7 07/11/2022 2213   CL 106 07/11/2022 2213   CO2 24 07/11/2022 2213   GLUCOSE 135 (H) 07/11/2022 2213   BUN 26 (H) 07/11/2022 2213   CREATININE 1.01 07/11/2022 2213   CALCIUM 9.7 07/11/2022 2213   PROT 7.2 07/11/2022 2213   ALBUMIN 3.9 07/11/2022 2213   AST 18 07/11/2022 2213   ALT 7 07/11/2022 2213   ALKPHOS 95 07/11/2022 2213   BILITOT 0.9 07/11/2022 2213   GFRNONAA >60 07/11/2022 2213   GFRAA >60 01/12/2018 1605     Assessment and Plan: * UTI (urinary tract infection) UA questionable for UTI, pt with generalized weakness reportedly worse than baseline and 1 episode of urinary incontinence.  No SIRS. UCx pending Empiric rocephin PT/OT SW consult for SNF placement  Parkinson's disease Cont Sinemet  Essential hypertension Cont home BP meds  Chronic a-fib (HCC) Continue digoxin, and cardizem for rate control 2d echo for murmur that I think I hear today on physical exam. Continue eliquis for now, though need to discuss ongoing risks / benefits with cardiologist as outpt given recent fall(s).      Advance Care Planning:   Code Status: Full Code  Consults: None  Family Communication: Family at bedside  Severity of Illness: The appropriate patient status for this patient is OBSERVATION. Observation status is judged to be reasonable and necessary in order to provide the required intensity of service to ensure the patient's safety. The  patient's presenting symptoms, physical exam findings, and initial radiographic and laboratory data in the context of their medical condition is felt to place them at decreased risk for further clinical deterioration. Furthermore, it is anticipated that the patient will be medically stable for discharge from the hospital within 2 midnights of admission.  Author: Etta Quill., DO 07/12/2022 4:57 AM  For on call review [email or web address].

## 2022-07-12 NOTE — Assessment & Plan Note (Signed)
Cont Sinemet

## 2022-07-12 NOTE — Plan of Care (Signed)
  Problem: Coping: Goal: Level of anxiety will decrease Outcome: Progressing   Problem: Elimination: Goal: Will not experience complications related to urinary retention Outcome: Progressing   Problem: Pain Managment: Goal: General experience of comfort will improve Outcome: Progressing   

## 2022-07-12 NOTE — Evaluation (Signed)
Occupational Therapy Evaluation Patient Details Name: Adam Kane MRN: [id number] DOB: Sep 03, 1932 Today's Date: 07/12/2022   History of Present Illness Pt is 88 yo male admitted on 07/11/22 with weakness and unsteadiness.  Pt found to have UTI and with hx of Parkinsons.  Other hx includes but not limited to Afib , HTN, CAD, CABG, back sx   Clinical Impression   Patient is a 88 year old male who was admitted for above. Patient was living at home independently with wife. Daughter and son in law support and [facility]. Patient was noted to have strong posterior lean with decreased safety awareness. Patient was noted to have decreased functional activity tolernace, decreased ROM, decreased BUE strength, decreased endurance, decreased sitting balance, decreased standing balanced, decreased safety awareness, and decreased knowledge of AE/AD impacting participation in ADLs. Patient would continue to benefit from skilled OT services at this time while admitted and after d/c to address noted deficits in order to improve overall safety and independence in ADLs.      Recommendations for follow up therapy are one component of a multi-disciplinary discharge planning process, led by the attending physician.  Recommendations may be updated based on patient status, additional functional criteria and insurance authorization.   Follow Up Recommendations  Skilled nursing-short term rehab (<3 hours/day)    Assistance Recommended at Discharge Frequent or constant Supervision/Assistance  Patient can return home with the following Two people to help with bathing/dressing/bathroom;Two people to help with walking and/or transfers;Direct supervision/assist for financial management;Help with stairs or ramp for entrance;Assist for transportation;Direct supervision/assist for medications management;Assistance with cooking/housework    Functional Status Assessment  Patient has  had a recent decline in their functional status and demonstrates the ability to make significant improvements in function in a reasonable and predictable amount of time.  Equipment Recommendations  Other (comment) (defer to next venue)    Recommendations for Other Services       Precautions / Restrictions Precautions Precautions: Fall Restrictions Weight Bearing Restrictions: No      Mobility Bed Mobility Overal bed mobility: Needs Assistance Bed Mobility: Supine to Sit, Sit to Supine     Supine to sit: Mod assist, +2 for safety/equipment Sit to supine: Min assist, +2 for safety/equipment   General bed mobility comments: Increased time, multimodal cues to initate and complete transfers, requiring assist for legs and trunk for both transfers    Transfers Overall transfer level: Needs assistance Equipment used: Rolling walker (2 wheels) Transfers: Sit to/from Stand, Bed to chair/wheelchair/BSC Sit to Stand: Mod assist, +2 safety/equipment     Step pivot transfers: +2 safety/equipment, Mod assist     General transfer comment: Stood from stretcher x 2 and chair x 1; increased time, cues to scoot forward , lean forward, and hand placement; tending to lean posteriorly with standing      Balance Overall balance assessment: Needs assistance Sitting-balance support: Bilateral upper extremity supported Sitting balance-Leahy Scale: Poor Sitting balance - Comments: REquiring UE support; initially with posterior lean but improved with RW in front for hands   Standing balance support: Bilateral upper extremity supported Standing balance-Leahy Scale: Poor Standing balance comment: Initially with posterior lean requiring mod A (particularly with ADLS); progressed to min A with time and cues                           ADL either performed or assessed with clinical judgement  ADL Overall ADL's : Needs assistance/impaired Eating/Feeding: Set up;Sitting   Grooming:  Minimal assistance;Sitting   Upper Body Bathing: Maximal assistance;Sitting   Lower Body Bathing: Maximal assistance;Sitting/lateral leans;Sit to/from stand   Upper Body Dressing : Maximal assistance;Sitting   Lower Body Dressing: Sit to/from stand;Sitting/lateral leans;Maximal assistance   Toilet Transfer: Moderate assistance;+2 for safety/equipment;+2 for physical assistance Toilet Transfer Details (indicate cue type and reason): patient was noted to be max A for standing with posterior leaning attempting to use urinal at edge of stretcher. Toileting- Clothing Manipulation and Hygiene: Maximal assistance;Sit to/from stand Toileting - Clothing Manipulation Details (indicate cue type and reason): unable to maintain standing balance able to hold urinal without dropping with TD for standing. noted to have toes off the ground.       General ADL Comments: patient was able to progress to min A for functional mobility with RW with increased time to transition between steps with time spent to gain balance prior to movement to next sequence.     Vision         Perception     Praxis      Pertinent Vitals/Pain Pain Assessment Pain Assessment: No/denies pain     Hand Dominance Right   Extremity/Trunk Assessment Upper Extremity Assessment Upper Extremity Assessment: Overall WFL for tasks assessed   Lower Extremity Assessment Lower Extremity Assessment: Defer to PT evaluation RLE Deficits / Details: ROM: bil knee lacking ~10 degrees ext but otherwise [facility]; MMT 4/5 throughout LLE Deficits / Details: ROM: bil knee lacking ~10 degrees ext but otherwise WFL; MMT 4/5 throughout   Cervical / Trunk Assessment Cervical / Trunk Assessment: Normal   Communication Communication Communication: No difficulties   Cognition Arousal/Alertness: Awake/alert Behavior During Therapy: WFL for tasks assessed/performed Overall Cognitive Status: Impaired/Different from baseline Area of Impairment:  Awareness, Orientation, Following commands, Problem solving                 Orientation Level: Disoriented to, Place     Following Commands: Follows one step commands with increased time   Awareness: Intellectual Problem Solving: Slow processing, Requires verbal cues, Requires tactile cues, Difficulty sequencing, Decreased initiation General Comments: Slower to respond at times but following basic commands     General Comments  Daughter and son in law present.  Very helpful and supportive.  Open to short term rehab if needed.  Educated on option for [facility] and LOUD (LSVT) in future as outpt for Parkinson's once pt back to baseline.    Exercises     Shoulder Instructions      Home Living Family/patient expects to be discharged to:: Private residence Living Arrangements: Spouse/significant other (spouse is unable to offer assistance to patient) Available Help at Discharge: Family;Available PRN/intermittently Type of Home: House Home Access: Ramped entrance     Home Layout: One level     Bathroom Shower/Tub: Occupational psychologist: Handicapped height Bathroom Accessibility: Yes   Home Equipment: Conservation officer, nature (2 wheels)   Additional Comments: Daughter and son in law present and provided history      Prior Functioning/Environment Prior Level of Function : Needs assist             Mobility Comments: Pt could ambulate in house and out to care with RW; did tend to push RW to far forward and with shuffle steps at times; familiy reports improves with cues ADLs Comments: Reports normally able to dress self except socks/shoes; goes to [facility] 1/2 day 3x week ; gets  showers 2 days week at [facility]; daughter assist with IADLs        OT Problem List: Decreased activity tolerance;Impaired balance (sitting and/or standing);Decreased coordination;Decreased safety awareness;Decreased knowledge of precautions;Decreased knowledge of use of DME or  AE;Cardiopulmonary status limiting activity      OT Treatment/Interventions: Self-care/ADL training;Therapeutic exercise;Neuromuscular education;Therapeutic activities;Patient/family education;DME and/or AE instruction;Cognitive remediation/compensation    OT Goals(Current goals can be found in the care plan section) Acute Rehab OT Goals Patient Stated Goal: to get better and go home OT Goal Formulation: With patient/family Time For Goal Achievement: 07/26/22 Potential to Achieve Goals: Fair  OT Frequency: Min 2X/week    Co-evaluation PT/OT/SLP Co-Evaluation/Treatment: Yes Reason for Co-Treatment: For patient/therapist safety;To address functional/ADL transfers PT goals addressed during session: Mobility/safety with mobility OT goals addressed during session: ADL's and self-care      AM-PAC OT "6 Clicks" Daily Activity     Outcome Measure Help from another person eating meals?: A Little Help from another person taking care of personal grooming?: A Little Help from another person toileting, which includes using toliet, bedpan, or urinal?: Total Help from another person bathing (including washing, rinsing, drying)?: A Lot Help from another person to put on and taking off regular upper body clothing?: A Lot Help from another person to put on and taking off regular lower body clothing?: A Lot 6 Click Score: 13   End of Session Equipment Utilized During Treatment: Gait belt;Rolling walker (2 wheels) Nurse Communication: Mobility status  Activity Tolerance: Patient tolerated treatment well Patient left: in bed;with call bell/phone within reach;with family/visitor present  OT Visit Diagnosis: Other abnormalities of gait and mobility (R26.89);Unsteadiness on feet (R26.81);History of falling (Z91.81);Muscle weakness (generalized) (M62.81)                Time: 0093-8182 OT Time Calculation (min): 32 min Charges:  OT General Charges $OT Visit: 1 Visit OT Evaluation $OT Eval Moderate  Complexity: 1 Mod  Dairon Procter OTR/L, MS Acute Rehabilitation Department Office# [phone]   Marcellina Millin 07/12/2022, 2:12 PM

## 2022-07-12 NOTE — Assessment & Plan Note (Addendum)
Continue digoxin, and cardizem for rate control 2d echo for murmur that I think I hear today on physical exam. Continue eliquis for now, though need to discuss ongoing risks / benefits with cardiologist as outpt given recent fall(s).

## 2022-07-12 NOTE — Assessment & Plan Note (Signed)
Cont home BP meds ?

## 2022-07-12 NOTE — Evaluation (Signed)
Physical Therapy Evaluation Patient Details Name: Adam Kane MRN: [id number] DOB: [birth year] Today's Date: 07/12/2022  History of Present Illness  Pt is 88 yo male admitted on 07/11/22 with weakness and unsteadiness.  Pt found to have UTI and with hx of Parkinsons.  Other hx includes but not limited to Afib , HTN, CAD, CABG, back sx  Clinical Impression  Pt admitted with above diagnosis. At baseline, pt is ambulatory with RW and independent with most ADLs and family assist with IADLs.  Pt lives with wife who is bed bound with daughter and son in law nearby who check on frequently.  He does typically have shuffle gait pattern.  Today, pt with increased confusion requiring min-mod A of 2 for safety with transfers and to ambulate 20'.  Pt slow to respond and needs increased time and cues.  Pt expected to progress well with therapy.  Recommend SNF at d/c but did educate family on LSVT ([facility] and Loud) outpt PT/SLP for Parkinsons as option in future if pt able to return home.  Pt currently with functional limitations due to the deficits listed below (see PT Problem List). Pt will benefit from skilled PT to increase their independence and safety with mobility to allow discharge to the venue listed below.          Recommendations for follow up therapy are one component of a multi-disciplinary discharge planning process, led by the attending physician.  Recommendations may be updated based on patient status, additional functional criteria and insurance authorization.  Follow Up Recommendations Skilled nursing-short term rehab (<3 hours/day) Can patient physically be transported by private vehicle: No    Assistance Recommended at Discharge Frequent or constant Supervision/Assistance  Patient can return home with the following  A lot of help with walking and/or transfers;A lot of help with bathing/dressing/bathroom;Assistance with cooking/housework;Help with stairs or ramp for entrance    Equipment  Recommendations None recommended by PT  Recommendations for Other Services       Functional Status Assessment Patient has had a recent decline in their functional status and demonstrates the ability to make significant improvements in function in a reasonable and predictable amount of time.     Precautions / Restrictions Precautions Precautions: Fall      Mobility  Bed Mobility Overal bed mobility: Needs Assistance Bed Mobility: Supine to Sit, Sit to Supine     Supine to sit: Mod assist, +2 for safety/equipment Sit to supine: Min assist, +2 for safety/equipment   General bed mobility comments: Increased time, multimodal cues to initate and complete transfers, requiring assist for legs and trunk for both transfers    Transfers Overall transfer level: Needs assistance Equipment used: Rolling walker (2 wheels) Transfers: Sit to/from Stand, Bed to chair/wheelchair/BSC Sit to Stand: Mod assist, +2 safety/equipment   Step pivot transfers: +2 safety/equipment, Mod assist       General transfer comment: Stood from stretcher x 2 and chair x 1; increased time, cues to scoot forward , lean forward, and hand placement; tending to lean posteriorly with standing    Ambulation/Gait Ambulation/Gait assistance: Min assist, +2 safety/equipment Gait Distance (Feet): 20 Feet Assistive device: Rolling walker (2 wheels) Gait Pattern/deviations: Step-to pattern, Shuffle, Trunk flexed Gait velocity: decreased     General Gait Details: trunk flexed with walker forward - cues for bigger steps and RW proximity; fatigued easily, some dizziness, had chair follow  Stairs            Wheelchair Mobility    Modified  Rankin (Stroke Patients Only)       Balance Overall balance assessment: Needs assistance Sitting-balance support: Bilateral upper extremity supported Sitting balance-Leahy Scale: Poor Sitting balance - Comments: REquiring UE support; initially with posterior lean but  improved with RW in front for hands   Standing balance support: Bilateral upper extremity supported Standing balance-Leahy Scale: Poor Standing balance comment: Initially with posterior lean requiring mod A (particularly with ADLS); progressed to min A with time and cues                             Pertinent Vitals/Pain Pain Assessment Pain Assessment: No/denies pain    Home Living Family/patient expects to be discharged to:: Private residence Living Arrangements: Spouse/significant other (spouse bed bound) Available Help at Discharge: Family;Available PRN/intermittently (dtr/son in law live close, check on daily) Type of Home: House Home Access: Ramped entrance       Home Layout: One level Home Equipment: Conservation officer, nature (2 wheels) Additional Comments: Daughter and son in law present and provided history    Prior Function Prior Level of Function : Needs assist             Mobility Comments: Pt could ambulate in house and out to care with RW; did tend to push RW to far forward and with shuffle steps at times; familiy reports improves with cues ADLs Comments: Reports normally able to dress self except socks/shoes; goes to [facility] 1/2 day 3x week ; gets showers 2 days week at [facility]; daughter assist with IADLs     Hand Dominance        Extremity/Trunk Assessment   Upper Extremity Assessment Upper Extremity Assessment: Defer to OT evaluation    Lower Extremity Assessment Lower Extremity Assessment: RLE deficits/detail;LLE deficits/detail RLE Deficits / Details: ROM: bil knee lacking ~10 degrees ext but otherwise [facility]; MMT 4/5 throughout LLE Deficits / Details: ROM: bil knee lacking ~10 degrees ext but otherwise [facility]; MMT 4/5 throughout    Cervical / Trunk Assessment Cervical / Trunk Assessment: Normal  Communication   Communication: No difficulties  Cognition Arousal/Alertness: Awake/alert Behavior During Therapy: WFL for tasks  assessed/performed Overall Cognitive Status: Impaired/Different from baseline Area of Impairment: Awareness, Orientation, Following commands, Problem solving                 Orientation Level: Disoriented to, Place     Following Commands: Follows one step commands with increased time   Awareness: Intellectual Problem Solving: Slow processing, Requires verbal cues, Requires tactile cues, Difficulty sequencing, Decreased initiation General Comments: Slower to respond at times but following basic commands        General Comments General comments (skin integrity, edema, etc.): Daughter and son in law present.  Very helpful and supportive.  Open to short term rehab if needed.  Educated on option for [facility] and LOU[facility] (LSVT) in future as outpt for Parkinson's once pt back to baseline.    Exercises     Assessment/Plan    PT Assessment Patient needs continued PT services  PT Problem List Decreased strength;Decreased balance;Decreased cognition;Decreased knowledge of precautions;Decreased range of motion;Decreased mobility;Decreased knowledge of use of DME;Decreased activity tolerance;Decreased coordination;Decreased safety awareness       PT Treatment Interventions DME instruction;Functional mobility training;Balance training;Patient/family education;Gait training;Therapeutic activities;Therapeutic exercise;Neuromuscular re-education    PT Goals (Current goals can be found in the Care Plan section)  Acute Rehab PT Goals Patient Stated Goal: agreeable to SNF PT Goal Formulation: With patient/family  Time For Goal Achievement: 07/26/22 Potential to Achieve Goals: Good    Frequency Min 2X/week     Co-evaluation PT/OT/SLP Co-Evaluation/Treatment: Yes Reason for Co-Treatment: To address functional/ADL transfers;For patient/therapist safety           AM-PAC PT "6 Clicks" Mobility  Outcome Measure Help needed turning from your back to your side while in a flat bed without using  bedrails?: A Lot Help needed moving from lying on your back to sitting on the side of a flat bed without using bedrails?: A Lot (+2 was for safety) Help needed moving to and from a bed to a chair (including a wheelchair)?: A Lot Help needed standing up from a chair using your arms (e.g., wheelchair or bedside chair)?: A Lot Help needed to walk in hospital room?: A Lot Help needed climbing 3-5 steps with a railing? : Total 6 Click Score: 11    End of Session Equipment Utilized During Treatment: Gait belt Activity Tolerance: Patient tolerated treatment well Patient left: in bed;with call bell/phone within reach;with family/visitor present ([facility]) Nurse Communication: Mobility status PT Visit Diagnosis: Other abnormalities of gait and mobility (R26.89);Muscle weakness (generalized) (M62.81)    Time: 1110-1141 PT Time Calculation (min) (ACUTE ONLY): 31 min   Charges:   PT Evaluation $PT Eval Moderate Complexity: 1 Mod          Tryson Lumley, PT Acute Rehab [facility] [phone]   Karlton Lemon 07/12/2022, 1:43 PM

## 2022-07-13 ENCOUNTER — Inpatient Hospital Stay (HOSPITAL_COMMUNITY): Payer: Medicare Other

## 2022-07-13 DIAGNOSIS — R011 Cardiac murmur, unspecified: Secondary | ICD-10-CM

## 2022-07-13 LAB — ECHOCARDIOGRAM COMPLETE
Area-P 1/2: 3.64 cm2
Calc EF: 62.6 %
Height: 76 in
S' Lateral: 4.2 cm
Single Plane A2C EF: 62.8 %
Single Plane A4C EF: 64.4 %
Weight: 3552 oz

## 2022-07-13 MED ORDER — QUETIAPINE FUMARATE 25 MG PO TABS
25.0000 mg | ORAL_TABLET | Freq: Every day | ORAL | Status: DC
  Administered 2022-07-13 – 2022-07-14 (×2): 25 mg via ORAL
  Filled 2022-07-13 (×2): qty 1

## 2022-07-13 MED ORDER — HALOPERIDOL LACTATE 5 MG/ML IJ SOLN: 5.0000 mg | Freq: Four times a day (QID) | INTRAMUSCULAR | Status: DC | PRN

## 2022-07-13 NOTE — Social Work (Signed)
CSW spoke to the Patient's daughter about the SNF process. CSW explained that we would send the dad out to facilities, in this time she can look at [facility] to see reviews etc. CSW also made the daughter aware that we had no control over facilities or could not get out opinions of where she would take her father. Patient would prefer [facility] or [place] as that is in the best interest of the father. CSW told patient that TOC will continue to give her updates as they came.

## 2022-07-13 NOTE — Plan of Care (Signed)
  Problem: Pain Managment: Goal: General experience of comfort will improve Outcome: Progressing   

## 2022-07-13 NOTE — Progress Notes (Signed)
Pt, daughter does not want [facility]. CSW did present other bed offers.

## 2022-07-13 NOTE — NC FL2 (Signed)
[place] LEVEL OF CARE SCREENING TOOL     IDENTIFICATION  Patient Name: Adam Kane Birthdate: 22-Feb-1933 Sex: male Admission Date (Current Location): 07/11/2022  [facility] and [place] Number:  Herbalist and Address:  [facility],  [place], [place]      Provider Number: [id number]  Attending Physician Name and Address:  Darliss Cheney, MD  Relative Name and Phone Number:  Marland Mcalpine [phone]    Current Level of Care: Hospital Recommended Level of Care: Skilled Nursing Facility Prior Approval Number:    Date Approved/Denied: 07/13/22 PASRR Number: [id number]  Discharge Plan: SNF    Current Diagnoses: Patient Active Problem List   Diagnosis Date Noted   UTI (urinary tract infection) 07/12/2022   Dyslipidemia (high LDL; low HDL) 07/15/2018   Long term current use of anticoagulant 07/15/2018   Lumbar disc herniation 01/15/2018   Parkinson's disease 08/01/2017   Coronary artery disease involving native coronary artery of native heart without angina pectoris 07/10/2017   Acute renal insufficiency 08/22/2014   AAA (abdominal aortic aneurysm) without rupture ([place]) 08/22/2014   Diverticulosis of colon without hemorrhage 08/22/2014   Right ureteral stone 08/21/2014   Hyperlipidemia 05/31/2013   Essential hypertension 03/03/2013   Leukocytosis, unspecified 03/03/2013   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 03/02/2013   Chronic a-fib ([place]) 03/02/2013    Orientation RESPIRATION BLADDER Height & Weight     Self, Time, Situation, Place  Normal Continent Weight: 222 lb (100.7 kg) Height:  '6\' 4"'$  (193 cm)  BEHAVIORAL SYMPTOMS/MOOD NEUROLOGICAL BOWEL NUTRITION STATUS      Continent    AMBULATORY STATUS COMMUNICATION OF NEEDS Skin   Limited Assist Verbally Normal                       Personal Care Assistance Level of Assistance  Bathing, Feeding, Dressing Bathing  Assistance: Limited assistance (A lot of help) Feeding assistance: Limited assistance Dressing Assistance: Limited assistance     Functional Limitations Info  Sight, Hearing, Speech Sight Info: Adequate Hearing Info: Adequate Speech Info: Adequate    SPECIAL CARE FACTORS FREQUENCY                       Contractures Contractures Info: Not present    Additional Factors Info  Code Status, Allergies Code Status Info: FULL Allergies Info: Dilaudid (hydromorphone Hcl) Low Intolerance Other (See Comments) PASSES OUT Morphine And Related           Current Medications (07/13/2022):  This is the current hospital active medication list Current Facility-Administered Medications  Medication Dose Route Frequency Provider Last Rate Last Admin   0.9 %  sodium chloride infusion   Intravenous Continuous Darliss Cheney, MD 75 mL/hr at 07/12/22 1954 New Bag at 07/12/22 1954   acetaminophen (TYLENOL) tablet 650 mg  650 mg Oral Q6H PRN Etta Quill, DO       Or   acetaminophen (TYLENOL) suppository 650 mg  650 mg Rectal Q6H PRN Etta Quill, DO       apixaban Arne Cleveland) tablet 5 mg  5 mg Oral BID Jennette Kettle M, DO   5 mg at 07/12/22 2134   atorvastatin (LIPITOR) tablet 40 mg  40 mg Oral QHS Jennette Kettle M, DO   40 mg at 07/12/22 2134   Carbidopa-Levodopa ER (SINEMET CR) 25-100 MG tablet controlled release 2 tablet  2 tablet Oral 3 times per  day Etta Quill, DO   2 tablet at 07/12/22 0940   cefTRIAXone (ROCEPHIN) 1 g in sodium chloride 0.9 % 100 mL IVPB  1 g Intravenous Q24H Darliss Cheney, MD 200 mL/hr at 07/13/22 0454 1 g at 07/13/22 0454   cholecalciferol (VITAMIN D3) 25 MCG (1000 UNIT) tablet 2,000 Units  2,000 Units Oral Daily Etta Quill, DO   2,000 Units at 07/12/22 6712   cyanocobalamin (VITAMIN B12) tablet 1,000 mcg  1,000 mcg Oral Daily Etta Quill, DO   1,000 mcg at 07/12/22 4580   digoxin (LANOXIN) tablet 0.125 mg  0.125 mg Oral Daily Jennette Kettle M, DO    0.125 mg at 07/12/22 0940   diltiazem (CARDIZEM CD) 24 hr capsule 180 mg  180 mg Oral Daily Lacretia Leigh, MD   180 mg at 07/12/22 0940   docusate sodium (COLACE) capsule 100 mg  100 mg Oral Daily Jennette Kettle M, DO   100 mg at 07/12/22 9983   ezetimibe (ZETIA) tablet 10 mg  10 mg Oral q morning Etta Quill, DO   10 mg at 07/12/22 0940   ondansetron (ZOFRAN) tablet 4 mg  4 mg Oral Q6H PRN Etta Quill, DO       Or   ondansetron [facility]) injection 4 mg  4 mg Intravenous Q6H PRN Etta Quill, DO       pantoprazole (PROTONIX) EC tablet 40 mg  40 mg Oral Daily Jennette Kettle M, DO   40 mg at 07/12/22 3825     Discharge Medications: Please see discharge summary for a list of discharge medications.  Relevant Imaging Results:  Relevant Lab Results:   Additional Information [place] Filomeno Cromley LCSW-A SSI # [id number]  Salli Real [place], [place]

## 2022-07-13 NOTE — Progress Notes (Signed)
Family requesting that the patient have another PT session. Paged [facility] weekend PT to let them know. Will continue to monitor patient.

## 2022-07-13 NOTE — Social Work (Addendum)
CSW has presented all 5 bed offers  to daughter . The daughter would like to get a chance to review, CSW explained to the daughter the process with bed offers. TOC will continue to follow.

## 2022-07-13 NOTE — Progress Notes (Signed)
CSW spoke to Pt's daughter, the Pt's daughter would like him to go to [facility], [facility], or [facility]. The daughter also stated that his PO can send him to a certain facility that he send his Pts to. CSW has advised the daughter that at the time she would have to pick a bed, as we will try to see if any of these are possible. TOC will continue to update as they come.

## 2022-07-13 NOTE — Progress Notes (Signed)
Patient's family requesting that patient have a sleep aid or something to help with anxiety/restlessness for tonight. Notified Dr. Doristine Bosworth, MD to request orders for that.

## 2022-07-13 NOTE — Progress Notes (Signed)
  Echocardiogram 2D Echocardiogram has been performed.  Adam Kane 07/13/2022, 9:08 AM

## 2022-07-13 NOTE — Progress Notes (Signed)
PROGRESS NOTE    Adam Kane  QIO:[id number] DOB: [birth year] DOA: 07/11/2022 PCP: Crist Infante, MD   Brief Narrative:  This is a pleasant 88 year old gentleman with history of atrial fibrillation on Eliquis, Parkinson disease, hypertension and CAD status post CABG was admitted under hospitalist service after midnight due to worsening generalized weakness and suspicion of possible UTI.   Assessment & Plan:   Principal Problem:   UTI (urinary tract infection) Active Problems:   Chronic a-fib (HCC)   Essential hypertension   Parkinson's disease  Generalized weakness/UTI/history of Parkinson disease: Patient with generalized weakness, likely secondary to UTI, continue Rocephin, follow culture.  Preliminary report shows that urine culture is growing E. coli but blood cultures negative thus far.  Patient has improved.  Seen by PT OT and they recommend SNF.  TOC working on that.  Patient is now medically stable for discharge.  Continue home medications.  Paroxysmal atrial fibrillation: Continue digoxin and Cardizem and Eliquis.  Essential hypertension: Controlled.  Continue home medications.  Hyperlipidemia: Continue atorvastatin.  DVT prophylaxis: Eliquis   Code Status: DNR  Family Communication: Daughter and son-in-law present at bedside.  Plan of care discussed with both of them.  She has not decided in favor of DNR.  I have switched to DNR today.  Status is: Inpatient Remains inpatient appropriate because: Medically stable, pending placement to SNF due to generalized weakness.   Estimated body mass index is 27.02 kg/m as calculated from the following:   Height as of this encounter: '6\' 4"'$  (1.93 m).   Weight as of this encounter: 100.7 kg.    Nutritional Assessment: Body mass index is 27.02 kg/m.Marland Kitchen Seen by dietician.  I agree with the assessment and plan as outlined below: Nutrition Status:        . Skin Assessment: I have examined the patient's skin and I agree  with the wound assessment as performed by the wound care RN as outlined below:    Consultants:  None  Procedures:  none  Antimicrobials:  Anti-infectives (From admission, onward)    Start     Dose/Rate Route Frequency Ordered Stop   07/13/22 0400  cefTRIAXone (ROCEPHIN) 1 g in sodium chloride 0.9 % 100 mL IVPB        1 g 200 mL/hr over 30 Minutes Intravenous Every 24 hours 07/12/22 1016 07/17/22 0359   07/12/22 0345  cefTRIAXone (ROCEPHIN) 1 g in sodium chloride 0.9 % 100 mL IVPB        1 g 200 mL/hr over 30 Minutes Intravenous  Once 07/12/22 0334 07/12/22 0711         Subjective: Patient seen and examined.  He has no complaints.  He is alert and mostly oriented.  He is at his baseline.  Objective: Vitals:   07/12/22 2136 07/13/22 0537 07/13/22 1019 07/13/22 1032  BP: (!) 143/95 (!) 161/88 120/76   Pulse: 100 92 96 82  Resp: 20 20 (!) 24   Temp: 98.4 F (36.9 C)  98.3 F (36.8 C)   TempSrc: Oral  Oral   SpO2: 98% 92% 94%   Weight:      Height:        Intake/Output Summary (Last 24 hours) at 07/13/2022 1100 Last data filed at 07/13/2022 1000 Gross per 24 hour  Intake 1486.61 ml  Output 1400 ml  Net 86.61 ml   Filed Weights   07/12/22 1400  Weight: 100.7 kg    Examination:  General exam: Appears calm and comfortable  Respiratory  system: Clear to auscultation. Respiratory effort normal. Cardiovascular system: S1 & S2 heard, RRR. No JVD, murmurs, rubs, gallops or clicks. No pedal edema. Gastrointestinal system: Abdomen is nondistended, soft and nontender. No organomegaly or masses felt. Normal bowel sounds heard. Central nervous system: Alert and oriented x2.  No focal deficit.  Data Reviewed: I have personally reviewed following labs and imaging studies  CBC: Recent Labs  Lab 07/11/22 2213  WBC 8.9  NEUTROABS 7.0  HGB 11.7*  HCT 39.2  MCV 82.5  PLT 683   Basic Metabolic Panel: Recent Labs  Lab 07/11/22 2213  NA 137  K 3.7  CL 106  CO2 24   GLUCOSE 135*  BUN 26*  CREATININE 1.01  CALCIUM 9.7   GFR: Estimated Creatinine Clearance: 63.3 mL/min (by C-G formula based on SCr of 1.01 mg/dL). Liver Function Tests: Recent Labs  Lab 07/11/22 2213  AST 18  ALT 7  ALKPHOS 95  BILITOT 0.9  PROT 7.2  ALBUMIN 3.9   No results for input(s): "LIPASE", "AMYLASE" in the last 168 hours. No results for input(s): "AMMONIA" in the last 168 hours. Coagulation Profile: No results for input(s): "INR", "PROTIME" in the last 168 hours. Cardiac Enzymes: Recent Labs  Lab 07/11/22 2213  CKTOTAL 95   BNP (last 3 results) No results for input(s): "PROBNP" in the last 8760 hours. HbA1C: No results for input(s): "HGBA1C" in the last 72 hours. CBG: Recent Labs  Lab 07/11/22 2239  GLUCAP 138*   Lipid Profile: No results for input(s): "CHOL", "HDL", "LDLCALC", "TRIG", "CHOLHDL", "LDLDIRECT" in the last 72 hours. Thyroid Function Tests: No results for input(s): "TSH", "T4TOTAL", "FREET4", "T3FREE", "THYROIDAB" in the last 72 hours. Anemia Panel: No results for input(s): "VITAMINB12", "FOLATE", "FERRITIN", "TIBC", "IRON", "RETICCTPCT" in the last 72 hours. Sepsis Labs: Recent Labs  Lab 07/11/22 2213 07/12/22 0044  LATICACIDVEN <0.3* 1.2    Recent Results (from the past 240 hour(s))  Blood culture (routine x 2)     Status: None (Preliminary result)   Collection Time: 07/11/22 10:13 PM   Specimen: BLOOD  Result Value Ref Range Status   Specimen Description   Final    BLOOD BLOOD LEFT FOREARM Performed at [facility] [address] [place], [place] [address]    Special Requests   Final    BOTTLES DRAWN AEROBIC AND ANAEROBIC Blood Culture results may not be optimal due to an excessive volume of blood received in culture bottles Performed at [facility] [address] [place], [place] [address]    Culture   Final    NO GROWTH 1 DAY Performed at [facility], [place] [address] [place], [place] [address]    Report Status PENDING  Incomplete  Blood culture (routine x 2)     Status: None (Preliminary result)   Collection Time: 07/11/22 10:55 PM   Specimen: BLOOD  Result Value Ref Range Status   Specimen Description   Final    BLOOD RIGHT ANTECUBITAL Performed at [facility] [address], [place], [place] [address]    Special Requests   Final    BOTTLES DRAWN AEROBIC AND ANAEROBIC Blood Culture results may not be optimal due to an excessive volume of blood received in culture bottles Performed at [facility] [address] [place], [place] [address]    Culture   Final    NO GROWTH 1 DAY Performed at [facility], [place] [address]  [place], [place] [address]    Report Status PENDING  Incomplete  Urine Culture     Status: Abnormal (Preliminary result)   Collection Time: 07/12/22  3:02 AM   Specimen: Urine, Clean Catch  Result Value Ref Range Status   Specimen Description   Final    URINE, CLEAN CATCH Performed at [facility], [place] [address] [place], [place] [address]    Special Requests   Final    NONE Performed at [facility], [place] [address], [place], [place] [address]    Culture >=100,000 COLONIES/mL ESCHERICHIA COLI (A)  Final   Report Status PENDING  Incomplete     Radiology Studies: CT HEAD WO CONTRAST (5MM)  Result Date: 07/11/2022 CLINICAL DATA:  Status post trauma. EXAM: CT HEAD WITHOUT CONTRAST TECHNIQUE: Contiguous axial images were obtained from the base of the skull through the vertex without intravenous contrast. RADIATION DOSE REDUCTION: This exam was performed according to the departmental dose-optimization program which includes automated exposure control, adjustment of the mA and/or kV according to patient size and/or use of iterative reconstruction technique. COMPARISON:  July 11, 2022 FINDINGS: Brain: There is moderate severity cerebral atrophy  with widening of the extra-axial spaces and ventricular dilatation. There are areas of decreased attenuation within the white matter tracts of the supratentorial brain, consistent with microvascular disease changes. Vascular: No hyperdense vessel or unexpected calcification. Skull: Normal. Negative for fracture or focal lesion. Sinuses/Orbits: There is marked severity sphenoid sinus and bilateral ethmoid sinus mucosal thickening. Other: None. IMPRESSION: 1. No acute intracranial abnormality. 2. Generalized cerebral atrophy and microvascular disease changes of the supratentorial brain. 3. Marked severity sphenoid sinus and bilateral ethmoid sinus disease. Electronically Signed   By: Virgina Norfolk M.D.   On: 07/11/2022 23:45   CT Head Wo Contrast  Result Date: 07/11/2022 CLINICAL DATA:  Head trauma, minor (Age >= 65y); Neck trauma (Age >= 65y) EXAM: CT HEAD WITHOUT CONTRAST CT CERVICAL SPINE WITHOUT CONTRAST TECHNIQUE: Multidetector CT imaging of the head and cervical spine was performed following the standard protocol without intravenous contrast. Multiplanar CT image reconstructions of the cervical spine were also generated. RADIATION DOSE REDUCTION: This exam was performed according to the departmental dose-optimization program which includes automated exposure control, adjustment of the mA and/or kV according to patient size and/or use of iterative reconstruction technique. COMPARISON:  None Available. FINDINGS: CT HEAD FINDINGS BRAIN: BRAIN Cerebral ventricle sizes are concordant with the degree of cerebral volume loss. Patchy and confluent areas of decreased attenuation are noted throughout the deep and periventricular white matter of the cerebral hemispheres bilaterally, compatible with chronic microvascular ischemic disease. No evidence of large-territorial acute infarction. No parenchymal hemorrhage. No mass lesion. No extra-axial collection. No mass effect or midline shift. No hydrocephalus. Basilar  cisterns are patent. Vascular: No hyperdense vessel. Skull: No acute fracture or focal lesion. Sinuses/Orbits: Right sphenoid sinus mucosal thickening. Paranasal sinuses and mastoid air cells are clear. Bilateral lens replacement. The orbits are unremarkable. Other: None. CT CERVICAL SPINE FINDINGS Alignment: Grade 1 anterolisthesis of C4 on C5 and C5 on C6. Skull base and vertebrae: Multilevel moderate severe degenerative changes of the spine. Associated severe osseous neural foraminal stenosis at the right C3-C4 and moderate severe osseous neural foraminal stenosis at the right C5-C6 levels. No acute fracture. No aggressive appearing focal osseous lesion or focal pathologic process. Soft tissues and spinal canal: No prevertebral fluid or swelling. No visible canal hematoma. Upper chest: Unremarkable. Other: Atherosclerotic plaque of the carotid arteries  within the neck. IMPRESSION: 1. No acute intracranial abnormality. 2. No acute displaced fracture or traumatic listhesis of the cervical spine. 3. Multilevel moderate severe degenerative changes of the spine. Grade 1 anterolisthesis of C4 on C5 and C5 on C6. Associated severe osseous neural foraminal stenosis at the right C3-C4. Electronically Signed   By: Iven Finn M.D.   On: 07/11/2022 21:48   CT Cervical Spine Wo Contrast  Result Date: 07/11/2022 CLINICAL DATA:  Head trauma, minor (Age >= 65y); Neck trauma (Age >= 65y) EXAM: CT HEAD WITHOUT CONTRAST CT CERVICAL SPINE WITHOUT CONTRAST TECHNIQUE: Multidetector CT imaging of the head and cervical spine was performed following the standard protocol without intravenous contrast. Multiplanar CT image reconstructions of the cervical spine were also generated. RADIATION DOSE REDUCTION: This exam was performed according to the departmental dose-optimization program which includes automated exposure control, adjustment of the mA and/or kV according to patient size and/or use of iterative reconstruction  technique. COMPARISON:  None Available. FINDINGS: CT HEAD FINDINGS BRAIN: BRAIN Cerebral ventricle sizes are concordant with the degree of cerebral volume loss. Patchy and confluent areas of decreased attenuation are noted throughout the deep and periventricular white matter of the cerebral hemispheres bilaterally, compatible with chronic microvascular ischemic disease. No evidence of large-territorial acute infarction. No parenchymal hemorrhage. No mass lesion. No extra-axial collection. No mass effect or midline shift. No hydrocephalus. Basilar cisterns are patent. Vascular: No hyperdense vessel. Skull: No acute fracture or focal lesion. Sinuses/Orbits: Right sphenoid sinus mucosal thickening. Paranasal sinuses and mastoid air cells are clear. Bilateral lens replacement. The orbits are unremarkable. Other: None. CT CERVICAL SPINE FINDINGS Alignment: Grade 1 anterolisthesis of C4 on C5 and C5 on C6. Skull base and vertebrae: Multilevel moderate severe degenerative changes of the spine. Associated severe osseous neural foraminal stenosis at the right C3-C4 and moderate severe osseous neural foraminal stenosis at the right C5-C6 levels. No acute fracture. No aggressive appearing focal osseous lesion or focal pathologic process. Soft tissues and spinal canal: No prevertebral fluid or swelling. No visible canal hematoma. Upper chest: Unremarkable. Other: Atherosclerotic plaque of the carotid arteries within the neck. IMPRESSION: 1. No acute intracranial abnormality. 2. No acute displaced fracture or traumatic listhesis of the cervical spine. 3. Multilevel moderate severe degenerative changes of the spine. Grade 1 anterolisthesis of C4 on C5 and C5 on C6. Associated severe osseous neural foraminal stenosis at the right C3-C4. Electronically Signed   By: Iven Finn M.D.   On: 07/11/2022 21:48    Scheduled Meds:  apixaban  5 mg Oral BID   atorvastatin  40 mg Oral QHS   Carbidopa-Levodopa ER  2 tablet Oral 3  times per day   cholecalciferol  2,000 Units Oral Daily   cyanocobalamin  1,000 mcg Oral Daily   digoxin  0.125 mg Oral Daily   diltiazem  180 mg Oral Daily   docusate sodium  100 mg Oral Daily   ezetimibe  10 mg Oral q morning   pantoprazole  40 mg Oral Daily   Continuous Infusions:  sodium chloride 75 mL/hr at 07/13/22 1016   cefTRIAXone (ROCEPHIN)  IV 1 g (07/13/22 0454)     LOS: 1 day   Darliss Cheney, MD [facility]  07/13/2022, 11:00 AM   *Please note that this is a verbal dictation therefore any spelling or grammatical errors are due to the "[place] [employer]" system interpretation.  Please page via [place] and do not message via secure chat for urgent patient care matters.  Secure chat can be used for non urgent patient care matters.  How to contact the [facility] Attending or Consulting provider [place] or covering provider during after hours [place], for this patient?  Check the care team in [facility] and look for a) attending/consulting [facility] provider listed and b) the [facility] team listed. Page or secure chat [phone]. Log into [email or web address] and use [place]'s universal password to access. If you do not have the password, please contact the hospital operator. Locate the [facility] provider you are looking for under [facility] and page to a number that you can be directly reached. If you still have difficulty reaching the provider, please page the [facility] (Director on Call) for the Hospitalists listed on amion for assistance.

## 2022-07-14 LAB — BASIC METABOLIC PANEL
Anion gap: 8 (ref 5–15)
BUN: 21 mg/dL (ref 8–23)
CO2: 24 mmol/L (ref 22–32)
Calcium: 9.2 mg/dL (ref 8.9–10.3)
Chloride: 107 mmol/L (ref 98–111)
Creatinine, Ser: 1.04 mg/dL (ref 0.61–1.24)
GFR, Estimated: >60 mL/min (ref >60)
Glucose, Bld: 112 mg/dL — ABNORMAL HIGH (ref 70–99)
Potassium: 3.5 mmol/L (ref 3.5–5.1)
Sodium: 139 mmol/L (ref 135–145)

## 2022-07-14 LAB — URINE CULTURE: Culture: >=100000 — AB

## 2022-07-14 MED ORDER — CEFADROXIL 500 MG PO CAPS
1000.0000 mg | ORAL_CAPSULE | Freq: Two times a day (BID) | ORAL | Status: DC
  Administered 2022-07-14 – 2022-07-15 (×2): 1000 mg via ORAL
  Filled 2022-07-14 (×2): qty 2

## 2022-07-14 NOTE — TOC Progression Note (Signed)
Transition of Care [facility]) - Progression Note    Patient Details  Name: Adam Kane MRN: [id number] Date of Birth: [birth year]  [facility]) CM/SW Contact  Lennart Pall, LCSW Phone Number: 07/14/2022, 3:10 PM  Clinical Narrative:    Have reviewed SNF bed offers with pt/daughter and they have accepted bed at [facility] who can admit pt tomorrow.  Will begin insurance auth.        Expected Discharge Plan and Services                                                 Social Determinants of Health (SDOH) Interventions    Readmission Risk Interventions     No data to display

## 2022-07-14 NOTE — Plan of Care (Signed)

## 2022-07-14 NOTE — Progress Notes (Signed)
PROGRESS NOTE    Adam Kane  OZD:[id number] DOB: 1933-05-05 DOA: 07/11/2022 PCP: Crist Infante, MD   Brief Narrative:  This is a pleasant 88 year old gentleman with history of atrial fibrillation on Eliquis, Parkinson disease, hypertension and CAD status post CABG was admitted under hospitalist service after midnight due to worsening generalized weakness and suspicion of possible UTI.   Assessment & Plan:   Principal Problem:   UTI (urinary tract infection) Active Problems:   Chronic a-fib (HCC)   Essential hypertension   Parkinson's disease  Generalized weakness/UTI/history of Parkinson disease: Patient with generalized weakness, likely secondary to UTI, urine culture growing E. coli, which is pansensitive, will transition to [employer].  Blood culture negative.  Seen by PT OT, recommendation SNF, TOC working with the family on that.  Paroxysmal atrial fibrillation: Continue digoxin and Cardizem and Eliquis.  Essential hypertension: Controlled.  Continue home medications.  Hyperlipidemia: Continue atorvastatin.  DVT prophylaxis: Eliquis   Code Status: DNR  Family Communication: Daughter and son-in-law present at bedside.  Plan of care discussed with both of them.   Status is: Inpatient Remains inpatient appropriate because: Medically stable, pending placement to SNF due to generalized weakness.   Estimated body mass index is 27.02 kg/m as calculated from the following:   Height as of this encounter: '6\' 4"'$  (1.93 m).   Weight as of this encounter: 100.7 kg.    Nutritional Assessment: Body mass index is 27.02 kg/m.[place] Kitchen Seen by dietician.  I agree with the assessment and plan as outlined below: Nutrition Status:        . Skin Assessment: I have examined the patient's skin and I agree with the wound assessment as performed by the wound care RN as outlined below:    Consultants:  None  Procedures:  none  Antimicrobials:  Anti-infectives (From admission,  onward)    Start     Dose/Rate Route Frequency Ordered Stop   07/14/22 2200  cefadroxil (DURICEF) capsule 1,000 mg        1,000 mg Oral 2 times daily 07/14/22 1159     07/13/22 0400  cefTRIAXone (ROCEPHIN) 1 g in sodium chloride 0.9 % 100 mL IVPB  Status:  Discontinued        1 g 200 mL/hr over 30 Minutes Intravenous Every 24 hours 07/12/22 1016 07/14/22 1159   07/12/22 0345  cefTRIAXone (ROCEPHIN) 1 g in sodium chloride 0.9 % 100 mL IVPB        1 g 200 mL/hr over 30 Minutes Intravenous  Once 07/12/22 0334 07/12/22 0711         Subjective:  Patient seen and examined.  He has no complaints.  Objective: Vitals:   07/13/22 2139 07/14/22 0553 07/14/22 0952 07/14/22 1345  BP: 123/78 128/80 103/69 127/86  Pulse: 92 89 88 88  Resp: '17 17  16  '$ Temp: 98.5 F (36.9 C) 98.8 F (37.1 C)  99 F (37.2 C)  TempSrc: Oral Oral    SpO2: 95% 93% 97% 97%  Weight:      Height:        Intake/Output Summary (Last 24 hours) at 07/14/2022 1416 Last data filed at 07/14/2022 1258 Gross per 24 hour  Intake 3252.56 ml  Output 2325 ml  Net 927.56 ml    Filed Weights   07/12/22 1400  Weight: 100.7 kg    Examination:  General exam: Appears calm and comfortable  Respiratory system: Clear to auscultation. Respiratory effort normal. Cardiovascular system: S1 & S2 heard, RRR. No JVD,  murmurs, rubs, gallops or clicks. No pedal edema. Gastrointestinal system: Abdomen is nondistended, soft and nontender. No organomegaly or masses felt. Normal bowel sounds heard. Central nervous system: Alert and oriented x2. No focal neurological deficits. Extremities: Symmetric 5 x 5 power. Skin: No rashes, lesions or ulcers.   Data Reviewed: I have personally reviewed following labs and imaging studies  CBC: Recent Labs  Lab 07/11/22 2213  WBC 8.9  NEUTROABS 7.0  HGB 11.7*  HCT 39.2  MCV 82.5  PLT 885    Basic Metabolic Panel: Recent Labs  Lab 07/11/22 2213 07/14/22 0322  NA 137 139  K 3.7  3.5  CL 106 107  CO2 24 24  GLUCOSE 135* 112*  BUN 26* 21  CREATININE 1.01 1.04  CALCIUM 9.7 9.2    GFR: Estimated Creatinine Clearance: 61.4 mL/min (by C-G formula based on SCr of 1.04 mg/dL). Liver Function Tests: Recent Labs  Lab 07/11/22 2213  AST 18  ALT 7  ALKPHOS 95  BILITOT 0.9  PROT 7.2  ALBUMIN 3.9    No results for input(s): "LIPASE", "AMYLASE" in the last 168 hours. No results for input(s): "AMMONIA" in the last 168 hours. Coagulation Profile: No results for input(s): "INR", "PROTIME" in the last 168 hours. Cardiac Enzymes: Recent Labs  Lab 07/11/22 2213  CKTOTAL 95    BNP (last 3 results) No results for input(s): "PROBNP" in the last 8760 hours. HbA1C: No results for input(s): "HGBA1C" in the last 72 hours. CBG: Recent Labs  Lab 07/11/22 2239  GLUCAP 138*    Lipid Profile: No results for input(s): "CHOL", "HDL", "LDLCALC", "TRIG", "CHOLHDL", "LDLDIRECT" in the last 72 hours. Thyroid Function Tests: No results for input(s): "TSH", "T4TOTAL", "FREET4", "T3FREE", "THYROIDAB" in the last 72 hours. Anemia Panel: No results for input(s): "VITAMINB12", "FOLATE", "FERRITIN", "TIBC", "IRON", "RETICCTPCT" in the last 72 hours. Sepsis Labs: Recent Labs  Lab 07/11/22 2213 07/12/22 0044  LATICACIDVEN <0.3* 1.2     Recent Results (from the past 240 hour(s))  Blood culture (routine x 2)     Status: None (Preliminary result)   Collection Time: 07/11/22 10:13 PM   Specimen: BLOOD  Result Value Ref Range Status   Specimen Description   Final    BLOOD BLOOD LEFT FOREARM Performed at [facility] [address] [place], [place] [address]    Special Requests   Final    BOTTLES DRAWN AEROBIC AND ANAEROBIC Blood Culture results may not be optimal due to an excessive volume of blood received in culture bottles Performed at [facility] [address] [place], [place] [address]    Culture   Final    NO GROWTH 2  DAYS Performed at [facility] [address], [place], [place] [address]    Report Status PENDING  Incomplete  Blood culture (routine x 2)     Status: None (Preliminary result)   Collection Time: 07/11/22 10:55 PM   Specimen: BLOOD  Result Value Ref Range Status   Specimen Description   Final    BLOOD RIGHT ANTECUBITAL Performed at [facility] [address], [place], [place] [address]    Special Requests   Final    BOTTLES DRAWN AEROBIC AND ANAEROBIC Blood Culture results may not be optimal due to an excessive volume of blood received in culture bottles Performed at [facility] [address], [place], [place] [address]    Culture   Final    NO  GROWTH 2 DAYS Performed at [facility], [place] [address], [place], [place] [address]    Report Status PENDING  Incomplete  Urine Culture     Status: Abnormal   Collection Time: 07/12/22  3:02 AM   Specimen: Urine, Clean Catch  Result Value Ref Range Status   Specimen Description   Final    URINE, CLEAN CATCH Performed at [facility], [place] [address], [place], [place] [address]    Special Requests   Final    NONE Performed at [facility], [place] [address] [place], [place] [address]    Culture >=100,000 COLONIES/mL ESCHERICHIA COLI (A)  Final   Report Status 07/14/2022 FINAL  Final   Organism ID, Bacteria ESCHERICHIA COLI (A)  Final      Susceptibility   Escherichia coli - MIC*    AMPICILLIN 4 SENSITIVE Sensitive     CEFAZOLIN <=4 SENSITIVE Sensitive     CEFEPIME <=0.12 SENSITIVE Sensitive     CEFTRIAXONE <=0.25 SENSITIVE Sensitive     CIPROFLOXACIN <=0.25 SENSITIVE Sensitive     GENTAMICIN <=1 SENSITIVE Sensitive     IMIPENEM 1 SENSITIVE Sensitive     NITROFURANTOIN <=16 SENSITIVE Sensitive     TRIMETH/SULFA <=20 SENSITIVE Sensitive     AMPICILLIN/SULBACTAM <=2 SENSITIVE Sensitive     PIP/TAZO <=4 SENSITIVE Sensitive     * >=100,000  COLONIES/mL ESCHERICHIA COLI     Radiology Studies: ECHOCARDIOGRAM COMPLETE  Result Date: 07/13/2022    ECHOCARDIOGRAM REPORT   Patient Name:   YOLTZIN BARG Date of Exam: 07/13/2022 Medical Rec #:  [id number]         Height:       76.0 in Accession #:    [id number]        Weight:       222.0 lb Date of Birth:  1932-10-06         BSA:          2.316 m Patient Age:    64 years          BP:           161/88 mmHg Patient Gender: M                 HR:           99 bpm. Exam Location:  Inpatient Procedure: 2D Echo, Color Doppler and Cardiac Doppler Indications:    Murmur  History:        Patient has prior history of Echocardiogram examinations, most                 recent 07/04/2016. CAD, Prior CABG, AAA, Arrythmias:Atrial                 Fibrillation, Signs/Symptoms:Shortness of Breath; Risk                 Factors:Former Smoker, Hypertension and Dyslipidemia. Cancer,                 CKD.  Sonographer:    Eartha Inch Referring Phys: [phone] JARED M GARDNER  Sonographer Comments: Technically difficult study due to poor echo windows. Image acquisition challenging due to patient body habitus and Image acquisition challenging due to respiratory motion. IMPRESSIONS  1. Left ventricular ejection fraction, by estimation, is 55 to 60%. The left ventricle has normal function. Left ventricular endocardial border not optimally defined to evaluate regional wall motion. Left ventricular diastolic function could not be evaluated.  2.  Right ventricular systolic function is mildly reduced. The right ventricular size is not well visualized.  3. Left atrial size was moderately dilated.  4. Right atrial size was moderately dilated.  5. The mitral valve is grossly normal. No evidence of mitral valve regurgitation. No evidence of mitral stenosis.  6. The aortic valve is abnormal. There is severe calcifcation of the aortic valve. Aortic valve regurgitation is trivial. There is possible mild aortic valve stenosis.  7. The inferior  vena cava is normal in size with greater than 50% respiratory variability, suggesting right atrial pressure of 3 mmHg. Comparison(s): Prior images unable to be directly viewed, comparison made by report only. FINDINGS  Left Ventricle: Left ventricular ejection fraction, by estimation, is 55 to 60%. The left ventricle has normal function. Left ventricular endocardial border not optimally defined to evaluate regional wall motion. The left ventricular internal cavity size was normal in size. There is no left ventricular hypertrophy. Left ventricular diastolic function could not be evaluated due to atrial fibrillation. Left ventricular diastolic function could not be evaluated. Right Ventricle: The right ventricular size is not well visualized. Right vetricular wall thickness was not well visualized. Right ventricular systolic function is mildly reduced. Left Atrium: Left atrial size was moderately dilated. Right Atrium: Right atrial size was moderately dilated. Pericardium: There is no evidence of pericardial effusion. Mitral Valve: The mitral valve is grossly normal. No evidence of mitral valve regurgitation. No evidence of mitral valve stenosis. Tricuspid Valve: The tricuspid valve is normal in structure. Tricuspid valve regurgitation is not demonstrated. No evidence of tricuspid stenosis. Aortic Valve: The aortic valve is abnormal. There is severe calcifcation of the aortic valve. Aortic valve regurgitation is trivial. Mild aortic stenosis is present. Pulmonic Valve: The pulmonic valve was not well visualized. Pulmonic valve regurgitation is not visualized. No evidence of pulmonic stenosis. Aorta: The aortic root is normal in size and structure. Venous: The inferior vena cava is normal in size with greater than 50% respiratory variability, suggesting right atrial pressure of 3 mmHg. IAS/Shunts: No atrial level shunt detected by color flow Doppler.  LEFT VENTRICLE PLAX 2D LVIDd:         4.90 cm      Diastology LVIDs:          4.20 cm      LV e' medial:    8.16 cm/s LV PW:         1.10 cm      LV E/e' medial:  12.2 LV IVS:        1.10 cm      LV e' lateral:   14.80 cm/s LVOT diam:     2.10 cm      LV E/e' lateral: 6.7 LV SV:         48 LV SV Index:   21 LVOT Area:     3.46 cm  LV Volumes (MOD) LV vol d, MOD A2C: 101.0 ml LV vol d, MOD A4C: 139.0 ml LV vol s, MOD A2C: 37.6 ml LV vol s, MOD A4C: 49.5 ml LV SV MOD A2C:     63.4 ml LV SV MOD A4C:     139.0 ml LV SV MOD BP:      74.9 ml RIGHT VENTRICLE            IVC RV S prime:     9.46 cm/s  IVC diam: 1.50 cm TAPSE (M-mode): 1.3 cm LEFT ATRIUM  Index        RIGHT ATRIUM           Index LA diam:        4.90 cm  2.12 cm/m   RA Area:     27.20 cm LA Vol (A2C):   102.0 ml 44.05 ml/m  RA Volume:   93.55 ml  40.40 ml/m LA Vol (A4C):   80.4 ml  34.72 ml/m LA Biplane Vol: 92.1 ml  39.77 ml/m  AORTIC VALVE LVOT Vmax:   87.70 cm/s LVOT Vmean:  60.400 cm/s LVOT VTI:    0.138 m  AORTA Ao Root diam: 3.50 cm MITRAL VALVE MV Area (PHT): 3.64 cm    SHUNTS MV Decel Time: 209 msec    Systemic VTI:  0.14 m MV E velocity: 99.30 cm/s  Systemic Diam: 2.10 cm Vishnu Priya Mallipeddi Electronically signed by Lorelee Cover Mallipeddi Signature Date/Time: 07/13/2022/1:47:00 PM    Final     Scheduled Meds:  apixaban  5 mg Oral BID   atorvastatin  40 mg Oral QHS   Carbidopa-Levodopa ER  2 tablet Oral 3 times per day   cefadroxil  1,000 mg Oral BID   cholecalciferol  2,000 Units Oral Daily   cyanocobalamin  1,000 mcg Oral Daily   digoxin  0.125 mg Oral Daily   diltiazem  180 mg Oral Daily   docusate sodium  100 mg Oral Daily   ezetimibe  10 mg Oral q morning   pantoprazole  40 mg Oral Daily   QUEtiapine  25 mg Oral QHS   Continuous Infusions:  sodium chloride 75 mL/hr at 07/14/22 1148     LOS: 2 days   Darliss Cheney, MD [facility]  07/14/2022, 2:16 PM   *Please note that this is a verbal dictation therefore any spelling or grammatical errors are due to the  "[employer]" system interpretation.  Please page via [place] and do not message via secure chat for urgent patient care matters. Secure chat can be used for non urgent patient care matters.  How to contact the [facility] Attending or Consulting provider [place] or covering provider during after hours [place], for this patient?  Check the care team in [facility] and look for a) attending/consulting [facility] provider listed and b) the [facility] team listed. Page or secure chat [phone]. Log into [email or web address] and use 's universal password to access. If you do not have the password, please contact the hospital operator. Locate the [facility] provider you are looking for under [facility] and page to a number that you can be directly reached. If you still have difficulty reaching the provider, please page the [facility] (Director on Call) for the Hospitalists listed on amion for assistance.

## 2022-07-14 NOTE — Evaluation (Signed)
Clinical/Bedside Swallow Evaluation Patient Details  Name: Adam Kane MRN: [id number] Date of Birth: [birth year]  Today's Date: 07/14/2022 Time: SLP Start Time (ACUTE ONLY): 0950 SLP Stop Time (ACUTE ONLY): 1010 SLP Time Calculation (min) (ACUTE ONLY): 20 min  Past Medical History:  Past Medical History:  Diagnosis Date   A-fib ([place])    Abdominal aortic aneurysm (AAA), 30-34 mm diameter (HCC)    Arthritis    CAD (coronary artery disease)    Cancer (HCC)    prostate cancer 2009   Chronic kidney disease    kidney stone   Complication of anesthesia    if given anesthesia too quickly experiences nausea and vomiting    Diverticulosis of colon without hemorrhage    Dyslipidemia    Dysrhythmia    Early-onset Parkinson's disease    GERD (gastroesophageal reflux disease)    Heart murmur    childhood   History of kidney stones    PONV (postoperative nausea and vomiting)    S/P CABG (coronary artery bypass graft) 2001   Shortness of breath    mild exertion due to afib   Systemic hypertension    Urinary leakage    Past Surgical History:  Past Surgical History:  Procedure Laterality Date   APPENDECTOMY     CARDIAC CATHETERIZATION  02/20/94   CATARACT EXTRACTION, BILATERAL     CHOLECYSTECTOMY     CORONARY ARTERY BYPASS GRAFT  2001   LIMA to LAD,SVG to PDA   CORONARY ARTERY BYPASS GRAFT     2 vessel 2001   CYSTOSCOPY WITH RETROGRADE PYELOGRAM, URETEROSCOPY AND STENT PLACEMENT Left 05/18/2014   Procedure: CYSTOSCOPY WITH RETROGRADE PYELOGRAM, URETEROSCOPY AND STENT PLACEMENT;  Surgeon: Raynelle Bring, MD;  Location: [facility];  Service: Urology;  Laterality: Left;   CYSTOSCOPY WITH RETROGRADE PYELOGRAM, URETEROSCOPY AND STENT PLACEMENT Right 08/21/2014   Procedure: CYSTOSCOPY WITH RETROGRADE PYELOGRAM, URETEROSCOPY AND STENT PLACEMENT WITH STONE EXTRACTION basket;  Surgeon: Malka So, MD;  Location: [facility];  Service: Urology;  Laterality: Right;   HERNIA REPAIR     HOLMIUM  LASER APPLICATION Left 0/34/7425   Procedure: HOLMIUM LASER APPLICATION;  Surgeon: Raynelle Bring, MD;  Location: [facility];  Service: Urology;  Laterality: Left;   LUMBAR LAMINECTOMY/DECOMPRESSION MICRODISCECTOMY Left 03/07/2013   Procedure: LUMBAR FOUR-FIVE EXTRAFORAMINAL LUMBAR LAMINECTOMY/DECOMPRESSION MICRODISCECTOMY 1 LEVEL;  Surgeon: Charlie Pitter, MD;  Location: [facility];  Service: Neurosurgery;  Laterality: Left;  Left lumbar four-five extraforaminal microdiskectomy   LUMBAR LAMINECTOMY/DECOMPRESSION MICRODISCECTOMY Left 01/15/2018   Procedure: Microdiscectomy - left - Lumbar Four -Lumbar Five extraforaminal;  Surgeon: Earnie Larsson, MD;  Location: [facility];  Service: Neurosurgery;  Laterality: Left;  Microdiscectomy - left - Lumbar Four -Lumbar Five extraforaminal   PROSTATECTOMY     TONSILLECTOMY     HPI:  Patient is an 88 y.o. male with PMH: Parkinson's disease, afib, HTN, CAD, CABG, back surgery, GERD. He presented to the hospital on 07/12/22 from home with increased weakness, unsteadiness. Daughter reported that she left his house at 2pm and when she returned at [place], he was laying on the ground. Initially family refused EMS as patient did not have any complaints but he then seemed to become more confused and unable to walk and so he was brought to hospital. In ED, patient with UA questionable for UTI.    Assessment / Plan / Recommendation  Clinical Impression  Patient is presenting with a questionable oropharyngeal dysphagia as per this bedside swallow evaluation with likely impact from Parkinson's  disease and h/o GERD. SLP observed patient with PO intake of straw sips of thin liquids (water) and his pills (taken whole with sips of water). Aside from patient swallowing an extra time with pills, no overt s/s aspiration or penetration observed. Daughter did report that patient will have some coughing/throat clearing at times when eating. SLP plans to try to see patient with PO intake at meal time to  determine if any further SLP intervention warranted. SLP Visit Diagnosis: Dysphagia, unspecified (R13.10)    Aspiration Risk  No limitations    Diet Recommendation Regular;Thin liquid   Liquid Administration via: Cup;Straw Medication Administration: Whole meds with liquid Supervision: Staff to assist with self feeding Compensations: Small sips/bites;Slow rate Postural Changes: Seated upright at 90 degrees    Other  Recommendations Oral Care Recommendations: Oral care BID    Recommendations for follow up therapy are one component of a multi-disciplinary discharge planning process, led by the attending physician.  Recommendations may be updated based on patient status, additional functional criteria and insurance authorization.  Follow up Recommendations Skilled nursing-short term rehab (<3 hours/day)      Assistance Recommended at Discharge Frequent or constant Supervision/Assistance  Functional Status Assessment Patient has had a recent decline in their functional status and demonstrates the ability to make significant improvements in function in a reasonable and predictable amount of time.  Frequency and Duration min 1 x/week  1 week       Prognosis Prognosis for Safe Diet Advancement: Good      Swallow Study   General Date of Onset: 07/13/22 HPI: Patient is an 88 y.o. male with PMH: Parkinson's disease, afib, HTN, CAD, CABG, back surgery, GERD. He presented to the hospital on 07/12/22 from home with increased weakness, unsteadiness. Daughter reported that she left his house at 2pm and when she returned at [place], he was laying on the ground. Initially family refused EMS as patient did not have any complaints but he then seemed to become more confused and unable to walk and so he was brought to hospital. In ED, patient with UA questionable for UTI. Type of Study: Bedside Swallow Evaluation Previous Swallow Assessment: none found Diet Prior to this Study: Regular;Thin  liquids Temperature Spikes Noted: No Respiratory Status: Room air History of Recent Intubation: No Behavior/Cognition: Alert;Cooperative;Pleasant mood Oral Cavity Assessment: Within Functional Limits Oral Care Completed by SLP: No Oral Cavity - Dentition: Adequate natural dentition Self-Feeding Abilities: Needs set up;Needs assist Patient Positioning: Upright in bed Baseline Vocal Quality: Low vocal intensity Volitional Cough: Strong Volitional Swallow: Able to elicit    Oral/Motor/Sensory Function Overall Oral Motor/Sensory Function:  (daughter reports lingual tremor)   Ice Chips Ice chips: Not tested   Thin Liquid Thin Liquid: Within functional limits Presentation: Straw    Nectar Thick     Honey Thick     Puree     Solid     Solid: Impaired Pharyngeal Phase Impairments: Multiple swallows Other Comments: pills (whole) taken with sips of water     Sonia Baller, MA, CCC-SLP Speech Therapy

## 2022-07-14 NOTE — Progress Notes (Signed)
Speech Language Pathology Treatment: Dysphagia  Patient Details Name: Adam Kane MRN: [id number] DOB: [birth year] Today's Date: 07/14/2022 Time: 1020-1030 SLP Time Calculation (min) (ACUTE ONLY): 10 min  Assessment / Plan / Recommendation Clinical Impression  Patient seen by SLP for skilled treatment focused on dysphagia goals. SLP observed patient with PO intake of lunch tray. He was sitting in recliner with tray on table in front of him and able to feed himself without difficulty. He was a rather fast eater which family reports is baseline. No immediate s/s of aspiration or penetration and no oral phase impairments observed but patient did exhibit a couple instances of delayed throat clearing. Voice remained clear throughout PO intake.  SLP not recommending any further skilled intervention and not recommending any changes to PO textures. Recommendation is for f/u with SLP at next venue of care (SNF) if patient exhibiting any concerns for dysphagia.    HPI HPI: Patient is an 88 y.o. male with PMH: Parkinson's disease, afib, HTN, CAD, CABG, back surgery, GERD. He presented to the hospital on 07/12/22 from home with increased weakness, unsteadiness. Daughter reported that she left his house at 2pm and when she returned at [place], he was laying on the ground. Initially family refused EMS as patient did not have any complaints but he then seemed to become more confused and unable to walk and so he was brought to hospital. In ED, patient with UA questionable for UTI.      SLP Plan  All goals met;Discharge SLP treatment due to (comment)      Recommendations for follow up therapy are one component of a multi-disciplinary discharge planning process, led by the attending physician.  Recommendations may be updated based on patient status, additional functional criteria and insurance authorization.    Recommendations  Diet recommendations: Regular;Thin liquid Liquids provided via:  Cup;Straw Medication Administration: Whole meds with liquid Supervision: Patient able to self feed;Intermittent supervision to cue for compensatory strategies Compensations: Small sips/bites;Slow rate Postural Changes and/or Swallow Maneuvers: Seated upright 90 degrees                Oral Care Recommendations: Oral care BID Follow Up Recommendations: Follow physician's recommendations for discharge plan and follow up therapies Assistance recommended at discharge: Intermittent Supervision/Assistance SLP Visit Diagnosis: Dysphagia, unspecified (R13.10) Plan: All goals met;Discharge SLP treatment due to (comment)          Sonia Baller, MA, CCC-SLP Speech Therapy

## 2022-07-15 MED ORDER — CEFADROXIL 500 MG PO CAPS: 1000.0000 mg | ORAL_CAPSULE | Freq: Two times a day (BID) | ORAL | 0 refills | Status: AC

## 2022-07-15 NOTE — Progress Notes (Signed)
Called [facility] to give report but unable to talk to staff. Left msg to call back.

## 2022-07-15 NOTE — Progress Notes (Signed)
Physical Therapy Treatment Patient Details Name: Adam Kane MRN: [id number] DOB: 1933/07/22 Today's Date: 07/15/2022   History of Present Illness Pt is 88 yo male admitted on 07/11/22 with weakness and unsteadiness.  Pt found to have UTI and with hx of Parkinsons.  Other hx includes but not limited to Afib , HTN, CAD, CABG, back sx    PT Comments    General Comments: AxO x 3 "feeling better" pleasant and following multi step commands.  Daughter present.  Stated pt goes to "[facility]" exercise class 3/wk General bed mobility comments: Pt OOB standing with NT/RN assisting then assisted to recliner. General transfer comment: pt required MAX Assist to rise from recliner with 25% VC's on proper hand placement to push up.  Daughter stated "he has a lift chair at home" but ususally does not need to elevated much.  Present with intial posterior LOB with poor self correction.  Rigid.  Vernie Shanks.  HIGH FALL RISK General Gait Details: Required + 2 asisst for safety such that recliner follows due to MAX c/o "weakness" and "fatigue".  Pt was able to progress his gait from 20 feet to only 28 feet.  Present with typical Parkinson's short shuffled steps.  Remains slow and rigid.  Increased balance instability with turns and LOB with backward steps to recliner with NO self correction.  HIGH FALL RISK. Pt will need ST Rehab at SNF to address mobility and functional decline prior to safely returning home.   Recommendations for follow up therapy are one component of a multi-disciplinary discharge planning process, led by the attending physician.  Recommendations may be updated based on patient status, additional functional criteria and insurance authorization.  Follow Up Recommendations  Skilled nursing-short term rehab (<3 hours/day) Can patient physically be transported by private vehicle: No   Assistance Recommended at Discharge Frequent or constant Supervision/Assistance  Patient can return home  with the following A lot of help with walking and/or transfers;A lot of help with bathing/dressing/bathroom;Assistance with cooking/housework;Help with stairs or ramp for entrance   Equipment Recommendations  None recommended by PT    Recommendations for Other Services       Precautions / Restrictions Precautions Precautions: Fall Precaution Comments: Hx Parkinson's     Mobility  Bed Mobility               General bed mobility comments: Pt OOB standing with NT/RN assisting then assisted to recliner.    Transfers Overall transfer level: Needs assistance Equipment used: Rolling walker (2 wheels) Transfers: Sit to/from Stand, Bed to chair/wheelchair/BSC Sit to Stand: Max assist           General transfer comment: pt required MAX Assist to rise from recliner with 25% VC's on proper hand placement to push up.  Daughter stated "he has a lift chair at home" but ususally does not need to elevated much.  Present with intial posterior LOB with poor self correction.  Rigid.  Vernie Shanks.  HIGH FALL RISK    Ambulation/Gait Ambulation/Gait assistance: Min assist, +2 safety/equipment Gait Distance (Feet): 28 Feet Assistive device: Rolling walker (2 wheels) Gait Pattern/deviations: Step-to pattern, Shuffle, Trunk flexed Gait velocity: decreased     General Gait Details: Required + 2 asisst for safety such that recliner follows due to MAX c/o "weakness" and "fatigue".  Pt was able to progress his gait from 20 feet to only 28 feet.  Present with typical Parkinson's short shuffled steps.  Remains slow and rigid.  Increased balance instability with  turns and LOB with backward steps to recliner with NO self correction.  HIGH FALL RISK.   Stairs             Wheelchair Mobility    Modified Rankin (Stroke Patients Only)       Balance                                            Cognition Arousal/Alertness: Awake/alert Behavior During Therapy: WFL for  tasks assessed/performed Overall Cognitive Status: Within Functional Limits for tasks assessed Area of Impairment: Awareness, Orientation, Following commands, Problem solving                       Following Commands: Follows multi-step commands consistently       General Comments: AxO x 3 "feeling better" pleasant and following multi step commands.  Daughter present.  Stated pt goes to "[facility] [employer]" exercise class 3/wk        Exercises      General Comments        Pertinent Vitals/Pain Pain Assessment Pain Assessment: Faces Faces Pain Scale: Hurts a little bit Pain Location: back of head 3/10 pain and "stiffness" in neck Pain Descriptors / Indicators: Aching, Grimacing Pain Intervention(s): Monitored during session, Repositioned    Home Living                          Prior Function            PT Goals (current goals can now be found in the care plan section) Progress towards PT goals: Progressing toward goals    Frequency    Min 2X/week      PT Plan Current plan remains appropriate    Co-evaluation              AM-PAC PT "6 Clicks" Mobility   Outcome Measure  Help needed turning from your back to your side while in a flat bed without using bedrails?: A Lot Help needed moving from lying on your back to sitting on the side of a flat bed without using bedrails?: A Lot Help needed moving to and from a bed to a chair (including a wheelchair)?: A Lot Help needed standing up from a chair using your arms (e.g., wheelchair or bedside chair)?: A Lot Help needed to walk in hospital room?: A Lot Help needed climbing 3-5 steps with a railing? : Total 6 Click Score: 11    End of Session Equipment Utilized During Treatment: Gait belt Activity Tolerance: Patient tolerated treatment well Patient left: in chair;with call bell/phone within reach;with family/visitor present Nurse Communication: Mobility status PT Visit Diagnosis: Other  abnormalities of gait and mobility (R26.89);Muscle weakness (generalized) (M62.81)     Time: 7001-7494 PT Time Calculation (min) (ACUTE ONLY): 31 min  Charges:  $Gait Training: 8-22 mins $Therapeutic Activity: 8-22 mins                     Rica Koyanagi  PTA [place] Office M-F          [phone] Weekend pager [phone]

## 2022-07-15 NOTE — TOC Transition Note (Signed)
Transition of Care [facility]) - CM/SW Discharge Note   Patient Details  Name: Adam Kane MRN: [id number] Date of Birth: 1933-02-22  [facility] of [facility] CM/SW Contact:  Lennart Pall, LCSW Phone Number: 07/15/2022, 10:42 AM   Clinical Narrative:    Have received insurance authorization for pt's admission today to [facility].  Pt and daughter aware and agreeable.  PTAR called at 10:40am.  RN to call report to [phone].  No further TOC needs.   Final next level of care: Skilled Nursing Facility Barriers to Discharge: Barriers Resolved   Patient Goals and CMS Choice        Discharge Placement              Patient chooses bed at: [facility] Patient to be transferred to facility by: [place] [facility] Name of family member notified: daughter, Adam Kane Patient and family notified of of transfer: 07/15/22  Discharge Plan and Services                DME Arranged: N/A DME Agency: NA                  Social Determinants of Health ([place]) Interventions     Readmission Risk Interventions     No data to display

## 2022-07-15 NOTE — Plan of Care (Signed)
  Problem: Activity: Goal: Risk for activity intolerance will decrease Outcome: Progressing   Problem: Pain Managment: Goal: General experience of comfort will improve Outcome: Progressing   Problem: Safety: Goal: Ability to remain free from injury will improve Outcome: Progressing   

## 2022-07-15 NOTE — Progress Notes (Signed)
Nurse from [facility] called back and report given.

## 2022-07-15 NOTE — Discharge Summary (Signed)
Physician Discharge Summary  Adam Kane GGY:[id number] DOB: 1932-09-12 DOA: 07/11/2022  PCP: Crist Infante, MD  Admit date: 07/11/2022 Discharge date: 07/15/2022 30 Day Unplanned Readmission Risk Score    Flowsheet Row ED to Hosp-Admission (Current) from 07/11/2022 in [place]  30 Day Unplanned Readmission Risk Score (%) 11.08 Filed at 07/15/2022 0801       This score is the patient's risk of an unplanned readmission within 30 days of being discharged (0 -100%). The score is based on dignosis, age, lab data, medications, orders, and past utilization.   Low:  0-14.9   Medium: 15-21.9   High: 22-29.9   Extreme: 30 and above          Admitted From: Home Disposition: Home  Recommendations for Outpatient Follow-up:  Follow up with PCP in 1-2 weeks Please obtain BMP/CBC in one week Please follow up with your PCP on the following pending results: Unresulted Labs (From admission, onward)    None         Home Health: None Equipment/Devices: None  Discharge Condition: Stable CODE STATUS: DNR Diet recommendation: Cardiac  Subjective: Seen and examined.  He has no complaints.  He is alert and oriented x2, at his baseline.  Daughter at the bedside.  Brief/Interim Summary: This is a pleasant 88 year old gentleman with history of atrial fibrillation on Eliquis, Parkinson disease, hypertension and CAD status post CABG was admitted under hospitalist service due to worsening generalized weakness and suspicion of possible UTI.    Generalized weakness/UTI/history of Parkinson disease: Patient with generalized weakness, likely secondary to UTI, urine culture growing E. coli, which is pansensitive, he was transitioned from Rocephin to [place].  Blood culture negative.  Seen by PT OT, recommendation SNF, TOC has arranged placement for him, he is being discharged in stable condition.  Family/daughter in agreement.   Paroxysmal atrial fibrillation: Continue digoxin  and Cardizem and Eliquis.   Essential hypertension: Controlled.  Continue home medications.   Hyperlipidemia: Continue atorvastatin.  Discharge plan was discussed with patient and/or family member and they verbalized understanding and agreed with it.  Discharge Diagnoses:  Principal Problem:   UTI (urinary tract infection) Active Problems:   Chronic a-fib (HCC)   Essential hypertension   Parkinson's disease    Discharge Instructions   Allergies as of 07/15/2022       Reactions   Dilaudid [hydromorphone Hcl] Other (See Comments)   PASSES OUT   Morphine And Related Other (See Comments)   PASSES OUT        Medication List     TAKE these medications    AMBULATORY NON FORMULARY MEDICATION Walker, 2 wheels, 2 tennis balls G20   atorvastatin 40 MG tablet Commonly known as: LIPITOR Take 40 mg by mouth at bedtime.   Carbidopa-Levodopa ER 25-100 MG tablet controlled release Commonly known as: SINEMET CR Take 2 tablets by mouth 3 (three) times daily. 9:30 am/12:30pm/3:30pm   cefadroxil 500 MG capsule Commonly known as: DURICEF Take 2 capsules (1,000 mg total) by mouth 2 (two) times daily for 2 days.   cholecalciferol 1000 units tablet Commonly known as: VITAMIN D Take 2,000 Units by mouth daily.   cyanocobalamin 1000 MCG tablet Commonly known as: VITAMIN B12 Take 1,000 mcg by mouth daily.   digoxin 0.125 MG tablet Commonly known as: LANOXIN Take 1 tablet (0.125 mg total) daily by mouth.   diltiazem 180 MG 24 hr capsule Commonly known as: TIAZAC Take 1 capsule (180 mg total) by mouth daily.  docusate sodium 100 MG capsule Commonly known as: COLACE Take 100 mg by mouth daily.   Eliquis 5 MG Tabs tablet Generic drug: apixaban Take 1 tablet by mouth 2 (two) times daily.   ezetimibe 10 MG tablet Commonly known as: ZETIA Take 10 mg by mouth every morning.   omeprazole 20 MG capsule Commonly known as: PRILOSEC Take 20 mg by mouth daily.         Follow-up Information     Crist Infante, MD Follow up in 1 week(s).   Specialty: Internal Medicine Contact information: [place] [address] [phone]                Allergies  Allergen Reactions   Dilaudid [Hydromorphone Hcl] Other (See Comments)    PASSES OUT   Morphine And Related Other (See Comments)    PASSES OUT    Consultations: None   Procedures/Studies: ECHOCARDIOGRAM COMPLETE  Result Date: 07/13/2022    ECHOCARDIOGRAM REPORT   Patient Name:   Adam Kane Date of Exam: 07/13/2022 Medical Rec #:  [id number]         Height:       76.0 in Accession #:    [id number]        Weight:       222.0 lb Date of Birth:  03/06/1933         BSA:          2.316 m Patient Age:    88 years          BP:           161/88 mmHg Patient Gender: M                 HR:           99 bpm. Exam Location:  Inpatient Procedure: 2D Echo, Color Doppler and Cardiac Doppler Indications:    Murmur  History:        Patient has prior history of Echocardiogram examinations, most                 recent 07/04/2016. CAD, Prior CABG, AAA, Arrythmias:Atrial                 Fibrillation, Signs/Symptoms:Shortness of Breath; Risk                 Factors:Former Smoker, Hypertension and Dyslipidemia. Cancer,                 CKD.  Sonographer:    Eartha Inch Referring Phys: [phone] JARED M GARDNER  Sonographer Comments: Technically difficult study due to poor echo windows. Image acquisition challenging due to patient body habitus and Image acquisition challenging due to respiratory motion. IMPRESSIONS  1. Left ventricular ejection fraction, by estimation, is 55 to 60%. The left ventricle has normal function. Left ventricular endocardial border not optimally defined to evaluate regional wall motion. Left ventricular diastolic function could not be evaluated.  2. Right ventricular systolic function is mildly reduced. The right ventricular size is not well visualized.  3. Left atrial size was moderately  dilated.  4. Right atrial size was moderately dilated.  5. The mitral valve is grossly normal. No evidence of mitral valve regurgitation. No evidence of mitral stenosis.  6. The aortic valve is abnormal. There is severe calcifcation of the aortic valve. Aortic valve regurgitation is trivial. There is possible mild aortic valve stenosis.  7. The inferior vena cava is normal in size with greater than 50%  respiratory variability, suggesting right atrial pressure of 3 mmHg. Comparison(s): Prior images unable to be directly viewed, comparison made by report only. FINDINGS  Left Ventricle: Left ventricular ejection fraction, by estimation, is 55 to 60%. The left ventricle has normal function. Left ventricular endocardial border not optimally defined to evaluate regional wall motion. The left ventricular internal cavity size was normal in size. There is no left ventricular hypertrophy. Left ventricular diastolic function could not be evaluated due to atrial fibrillation. Left ventricular diastolic function could not be evaluated. Right Ventricle: The right ventricular size is not well visualized. Right vetricular wall thickness was not well visualized. Right ventricular systolic function is mildly reduced. Left Atrium: Left atrial size was moderately dilated. Right Atrium: Right atrial size was moderately dilated. Pericardium: There is no evidence of pericardial effusion. Mitral Valve: The mitral valve is grossly normal. No evidence of mitral valve regurgitation. No evidence of mitral valve stenosis. Tricuspid Valve: The tricuspid valve is normal in structure. Tricuspid valve regurgitation is not demonstrated. No evidence of tricuspid stenosis. Aortic Valve: The aortic valve is abnormal. There is severe calcifcation of the aortic valve. Aortic valve regurgitation is trivial. Mild aortic stenosis is present. Pulmonic Valve: The pulmonic valve was not well visualized. Pulmonic valve regurgitation is not visualized. No  evidence of pulmonic stenosis. Aorta: The aortic root is normal in size and structure. Venous: The inferior vena cava is normal in size with greater than 50% respiratory variability, suggesting right atrial pressure of 3 mmHg. IAS/Shunts: No atrial level shunt detected by color flow Doppler.  LEFT VENTRICLE PLAX 2D LVIDd:         4.90 cm      Diastology LVIDs:         4.20 cm      LV e' medial:    8.16 cm/s LV PW:         1.10 cm      LV E/e' medial:  12.2 LV IVS:        1.10 cm      LV e' lateral:   14.80 cm/s LVOT diam:     2.10 cm      LV E/e' lateral: 6.7 LV SV:         48 LV SV Index:   21 LVOT Area:     3.46 cm  LV Volumes (MOD) LV vol d, MOD A2C: 101.0 ml LV vol d, MOD A4C: 139.0 ml LV vol s, MOD A2C: 37.6 ml LV vol s, MOD A4C: 49.5 ml LV SV MOD A2C:     63.4 ml LV SV MOD A4C:     139.0 ml LV SV MOD BP:      74.9 ml RIGHT VENTRICLE            IVC RV S prime:     9.46 cm/s  IVC diam: 1.50 cm TAPSE (M-mode): 1.3 cm LEFT ATRIUM              Index        RIGHT ATRIUM           Index LA diam:        4.90 cm  2.12 cm/m   RA Area:     27.20 cm LA Vol (A2C):   102.0 ml 44.05 ml/m  RA Volume:   93.55 ml  40.40 ml/m LA Vol (A4C):   80.4 ml  34.72 ml/m LA Biplane Vol: 92.1 ml  39.77 ml/m  AORTIC VALVE LVOT Vmax:   87.70  cm/s LVOT Vmean:  60.400 cm/s LVOT VTI:    0.138 m  AORTA Ao Root diam: 3.50 cm MITRAL VALVE MV Area (PHT): 3.64 cm    SHUNTS MV Decel Time: 209 msec    Systemic VTI:  0.14 m MV E velocity: 99.30 cm/s  Systemic Diam: 2.10 cm Vishnu Priya Mallipeddi Electronically signed by Lorelee Cover Mallipeddi Signature Date/Time: 07/13/2022/1:47:00 PM    Final    CT HEAD WO CONTRAST (5MM)  Result Date: 07/11/2022 CLINICAL DATA:  Status post trauma. EXAM: CT HEAD WITHOUT CONTRAST TECHNIQUE: Contiguous axial images were obtained from the base of the skull through the vertex without intravenous contrast. RADIATION DOSE REDUCTION: This exam was performed according to the departmental dose-optimization  program which includes automated exposure control, adjustment of the mA and/or kV according to patient size and/or use of iterative reconstruction technique. COMPARISON:  July 11, 2022 FINDINGS: Brain: There is moderate severity cerebral atrophy with widening of the extra-axial spaces and ventricular dilatation. There are areas of decreased attenuation within the white matter tracts of the supratentorial brain, consistent with microvascular disease changes. Vascular: No hyperdense vessel or unexpected calcification. Skull: Normal. Negative for fracture or focal lesion. Sinuses/Orbits: There is marked severity sphenoid sinus and bilateral ethmoid sinus mucosal thickening. Other: None. IMPRESSION: 1. No acute intracranial abnormality. 2. Generalized cerebral atrophy and microvascular disease changes of the supratentorial brain. 3. Marked severity sphenoid sinus and bilateral ethmoid sinus disease. Electronically Signed   By: Virgina Norfolk M.D.   On: 07/11/2022 23:45   CT Head Wo Contrast  Result Date: 07/11/2022 CLINICAL DATA:  Head trauma, minor (Age >= 65y); Neck trauma (Age >= 65y) EXAM: CT HEAD WITHOUT CONTRAST CT CERVICAL SPINE WITHOUT CONTRAST TECHNIQUE: Multidetector CT imaging of the head and cervical spine was performed following the standard protocol without intravenous contrast. Multiplanar CT image reconstructions of the cervical spine were also generated. RADIATION DOSE REDUCTION: This exam was performed according to the departmental dose-optimization program which includes automated exposure control, adjustment of the mA and/or kV according to patient size and/or use of iterative reconstruction technique. COMPARISON:  None Available. FINDINGS: CT HEAD FINDINGS BRAIN: BRAIN Cerebral ventricle sizes are concordant with the degree of cerebral volume loss. Patchy and confluent areas of decreased attenuation are noted throughout the deep and periventricular white matter of the cerebral  hemispheres bilaterally, compatible with chronic microvascular ischemic disease. No evidence of large-territorial acute infarction. No parenchymal hemorrhage. No mass lesion. No extra-axial collection. No mass effect or midline shift. No hydrocephalus. Basilar cisterns are patent. Vascular: No hyperdense vessel. Skull: No acute fracture or focal lesion. Sinuses/Orbits: Right sphenoid sinus mucosal thickening. Paranasal sinuses and mastoid air cells are clear. Bilateral lens replacement. The orbits are unremarkable. Other: None. CT CERVICAL SPINE FINDINGS Alignment: Grade 1 anterolisthesis of C4 on C5 and C5 on C6. Skull base and vertebrae: Multilevel moderate severe degenerative changes of the spine. Associated severe osseous neural foraminal stenosis at the right C3-C4 and moderate severe osseous neural foraminal stenosis at the right C5-C6 levels. No acute fracture. No aggressive appearing focal osseous lesion or focal pathologic process. Soft tissues and spinal canal: No prevertebral fluid or swelling. No visible canal hematoma. Upper chest: Unremarkable. Other: Atherosclerotic plaque of the carotid arteries within the neck. IMPRESSION: 1. No acute intracranial abnormality. 2. No acute displaced fracture or traumatic listhesis of the cervical spine. 3. Multilevel moderate severe degenerative changes of the spine. Grade 1 anterolisthesis of C4 on C5 and C5 on C6. Associated severe  osseous neural foraminal stenosis at the right C3-C4. Electronically Signed   By: Iven Finn M.D.   On: 07/11/2022 21:48   CT Cervical Spine Wo Contrast  Result Date: 07/11/2022 CLINICAL DATA:  Head trauma, minor (Age >= 65y); Neck trauma (Age >= 65y) EXAM: CT HEAD WITHOUT CONTRAST CT CERVICAL SPINE WITHOUT CONTRAST TECHNIQUE: Multidetector CT imaging of the head and cervical spine was performed following the standard protocol without intravenous contrast. Multiplanar CT image reconstructions of the cervical spine were also  generated. RADIATION DOSE REDUCTION: This exam was performed according to the departmental dose-optimization program which includes automated exposure control, adjustment of the mA and/or kV according to patient size and/or use of iterative reconstruction technique. COMPARISON:  None Available. FINDINGS: CT HEAD FINDINGS BRAIN: BRAIN Cerebral ventricle sizes are concordant with the degree of cerebral volume loss. Patchy and confluent areas of decreased attenuation are noted throughout the deep and periventricular white matter of the cerebral hemispheres bilaterally, compatible with chronic microvascular ischemic disease. No evidence of large-territorial acute infarction. No parenchymal hemorrhage. No mass lesion. No extra-axial collection. No mass effect or midline shift. No hydrocephalus. Basilar cisterns are patent. Vascular: No hyperdense vessel. Skull: No acute fracture or focal lesion. Sinuses/Orbits: Right sphenoid sinus mucosal thickening. Paranasal sinuses and mastoid air cells are clear. Bilateral lens replacement. The orbits are unremarkable. Other: None. CT CERVICAL SPINE FINDINGS Alignment: Grade 1 anterolisthesis of C4 on C5 and C5 on C6. Skull base and vertebrae: Multilevel moderate severe degenerative changes of the spine. Associated severe osseous neural foraminal stenosis at the right C3-C4 and moderate severe osseous neural foraminal stenosis at the right C5-C6 levels. No acute fracture. No aggressive appearing focal osseous lesion or focal pathologic process. Soft tissues and spinal canal: No prevertebral fluid or swelling. No visible canal hematoma. Upper chest: Unremarkable. Other: Atherosclerotic plaque of the carotid arteries within the neck. IMPRESSION: 1. No acute intracranial abnormality. 2. No acute displaced fracture or traumatic listhesis of the cervical spine. 3. Multilevel moderate severe degenerative changes of the spine. Grade 1 anterolisthesis of C4 on C5 and C5 on C6. Associated  severe osseous neural foraminal stenosis at the right C3-C4. Electronically Signed   By: Iven Finn M.D.   On: 07/11/2022 21:48     Discharge Exam: Vitals:   07/14/22 2212 07/15/22 0511  BP: 134/88 123/84  Pulse: 97 89  Resp: 17 17  Temp: 98.6 F (37 C) 97.6 F (36.4 C)  SpO2: 94% 94%   Vitals:   07/14/22 0952 07/14/22 1345 07/14/22 2212 07/15/22 0511  BP: 103/69 127/86 134/88 123/84  Pulse: 88 88 97 89  Resp:  '16 17 17  '$ Temp:  99 F (37.2 C) 98.6 F (37 C) 97.6 F (36.4 C)  TempSrc:   Oral Oral  SpO2: 97% 97% 94% 94%  Weight:      Height:        General: Pt is alert, awake, not in acute distress Cardiovascular: RRR, S1/S2 +, no rubs, no gallops Respiratory: CTA bilaterally, no wheezing, no rhonchi Abdominal: Soft, NT, ND, bowel sounds + Extremities: no edema, no cyanosis    The results of significant diagnostics from this hospitalization (including imaging, microbiology, ancillary and laboratory) are listed below for reference.     Microbiology: Recent Results (from the past 240 hour(s))  Blood culture (routine x 2)     Status: None (Preliminary result)   Collection Time: 07/11/22 10:13 PM   Specimen: BLOOD  Result Value Ref Range Status  Specimen Description   Final    BLOOD BLOOD LEFT FOREARM Performed at [facility] [address] [place], [place] [address]    Special Requests   Final    BOTTLES DRAWN AEROBIC AND ANAEROBIC Blood Culture results may not be optimal due to an excessive volume of blood received in culture bottles Performed at [facility] [address] [place], [place] [address]    Culture   Final    NO GROWTH 2 DAYS Performed at [facility] [address] [place], [place] [address]    Report Status PENDING  Incomplete  Blood culture (routine x 2)     Status: None (Preliminary result)   Collection Time: 07/11/22 10:55 PM   Specimen: BLOOD  Result Value Ref Range Status   Specimen  Description   Final    BLOOD RIGHT ANTECUBITAL Performed at [facility] [address] [place], [place] [address]    Special Requests   Final    BOTTLES DRAWN AEROBIC AND ANAEROBIC Blood Culture results may not be optimal due to an excessive volume of blood received in culture bottles Performed at [facility] [address], [place], [place] [address]    Culture   Final    NO GROWTH 2 DAYS Performed at [facility] [address] [place], [place] [address]    Report Status PENDING  Incomplete  Urine Culture     Status: Abnormal   Collection Time: 07/12/22  3:02 AM   Specimen: Urine, Clean Catch  Result Value Ref Range Status   Specimen Description   Final    URINE, CLEAN CATCH Performed at [facility], [place] [address] [place], [place] [address]    Special Requests   Final    NONE Performed at [facility], [place] [address], [place], [place] [address]    Culture >=100,000 COLONIES/mL ESCHERICHIA COLI (A)  Final   Report Status 07/14/2022 FINAL  Final   Organism ID, Bacteria ESCHERICHIA COLI (A)  Final      Susceptibility   Escherichia coli - MIC*    AMPICILLIN 4 SENSITIVE Sensitive     CEFAZOLIN <=4 SENSITIVE Sensitive     CEFEPIME <=0.12 SENSITIVE Sensitive     CEFTRIAXONE <=0.25 SENSITIVE Sensitive     CIPROFLOXACIN <=0.25 SENSITIVE Sensitive     GENTAMICIN <=1 SENSITIVE Sensitive     IMIPENEM 1 SENSITIVE Sensitive     NITROFURANTOIN <=16 SENSITIVE Sensitive     TRIMETH/SULFA <=20 SENSITIVE Sensitive     AMPICILLIN/SULBACTAM <=2 SENSITIVE Sensitive     PIP/TAZO <=4 SENSITIVE Sensitive     * >=100,000 COLONIES/mL ESCHERICHIA COLI     Labs: BNP (last 3 results) No results for input(s): "BNP" in the last 8760 hours. Basic Metabolic Panel: Recent Labs  Lab 07/11/22 2213 07/14/22 0322  NA 137 139  K 3.7 3.5  CL 106 107  CO2 24 24  GLUCOSE 135* 112*  BUN 26* 21  CREATININE 1.01  1.04  CALCIUM 9.7 9.2   Liver Function Tests: Recent Labs  Lab 07/11/22 2213  AST 18  ALT 7  ALKPHOS 95  BILITOT 0.9  PROT 7.2  ALBUMIN 3.9   No results for input(s): "LIPASE", "AMYLASE" in the last 168 hours. No results for input(s): "AMMONIA" in the last 168 hours. CBC: Recent Labs  Lab 07/11/22 2213  WBC 8.9  NEUTROABS 7.0  HGB 11.7*  HCT 39.2  MCV  82.5  PLT 199   Cardiac Enzymes: Recent Labs  Lab 07/11/22 2213  CKTOTAL 95   BNP: Invalid input(s): "POCBNP" CBG: Recent Labs  Lab 07/11/22 2239  GLUCAP 138*   D-Dimer No results for input(s): "DDIMER" in the last 72 hours. Hgb A1c No results for input(s): "HGBA1C" in the last 72 hours. Lipid Profile No results for input(s): "CHOL", "HDL", "LDLCALC", "TRIG", "CHOLHDL", "LDLDIRECT" in the last 72 hours. Thyroid function studies No results for input(s): "TSH", "T4TOTAL", "T3FREE", "THYROIDAB" in the last 72 hours.  Invalid input(s): "FREET3" Anemia work up No results for input(s): "VITAMINB12", "FOLATE", "FERRITIN", "TIBC", "IRON", "RETICCTPCT" in the last 72 hours. Urinalysis    Component Value Date/Time   COLORURINE YELLOW 07/12/2022 0302   APPEARANCEUR HAZY (A) 07/12/2022 0302   LABSPEC 1.023 07/12/2022 0302   PHURINE 5.0 07/12/2022 0302   GLUCOSEU NEGATIVE 07/12/2022 0302   HGBUR SMALL (A) 07/12/2022 0302   BILIRUBINUR NEGATIVE 07/12/2022 0302   KETONESUR NEGATIVE 07/12/2022 0302   PROTEINUR NEGATIVE 07/12/2022 0302   UROBILINOGEN 1.0 08/18/2014 1751   NITRITE NEGATIVE 07/12/2022 0302   LEUKOCYTESUR SMALL (A) 07/12/2022 0302   Sepsis Labs Recent Labs  Lab 07/11/22 2213  WBC 8.9   Microbiology Recent Results (from the past 240 hour(s))  Blood culture (routine x 2)     Status: None (Preliminary result)   Collection Time: 07/11/22 10:13 PM   Specimen: BLOOD  Result Value Ref Range Status   Specimen Description   Final    BLOOD BLOOD LEFT FOREARM Performed at [facility], [place] [address] [place], [place] [address]    Special Requests   Final    BOTTLES DRAWN AEROBIC AND ANAEROBIC Blood Culture results may not be optimal due to an excessive volume of blood received in culture bottles Performed at [facility] [address] [place], [place] [address]    Culture   Final    NO GROWTH 2 DAYS Performed at [facility] [address] [place], [place] [address]    Report Status PENDING  Incomplete  Blood culture (routine x 2)     Status: None (Preliminary result)   Collection Time: 07/11/22 10:55 PM   Specimen: BLOOD  Result Value Ref Range Status   Specimen Description   Final    BLOOD RIGHT ANTECUBITAL Performed at [facility] [address], [place], [place] [address]    Special Requests   Final    BOTTLES DRAWN AEROBIC AND ANAEROBIC Blood Culture results may not be optimal due to an excessive volume of blood received in culture bottles Performed at [facility] [address], [place], [place] [address]    Culture   Final    NO GROWTH 2 DAYS Performed at [facility] [address], [place], [place] [address]    Report Status PENDING  Incomplete  Urine Culture     Status: Abnormal   Collection Time: 07/12/22  3:02 AM   Specimen: Urine, Clean Catch  Result Value Ref Range Status   Specimen Description   Final    URINE, CLEAN CATCH Performed at [facility], [place] [address], [place], [place] [address]    Special Requests   Final    NONE Performed at [facility], [place] [address] [place], [place] [address]    Culture >=100,000 COLONIES/mL ESCHERICHIA COLI (A)  Final   Report Status 07/14/2022 FINAL  Final   Organism  ID, Bacteria ESCHERICHIA COLI (A)  Final      Susceptibility   Escherichia coli - MIC*    AMPICILLIN 4 SENSITIVE Sensitive     CEFAZOLIN <=4 SENSITIVE Sensitive     CEFEPIME <=0.12 SENSITIVE Sensitive      CEFTRIAXONE <=0.25 SENSITIVE Sensitive     CIPROFLOXACIN <=0.25 SENSITIVE Sensitive     GENTAMICIN <=1 SENSITIVE Sensitive     IMIPENEM 1 SENSITIVE Sensitive     NITROFURANTOIN <=16 SENSITIVE Sensitive     TRIMETH/SULFA <=20 SENSITIVE Sensitive     AMPICILLIN/SULBACTAM <=2 SENSITIVE Sensitive     PIP/TAZO <=4 SENSITIVE Sensitive     * >=100,000 COLONIES/mL ESCHERICHIA COLI     Time coordinating discharge: Over 30 minutes  SIGNED:   Darliss Cheney, MD  [facility] 07/15/2022, 10:13 AM *Please note that this is a verbal dictation therefore any spelling or grammatical errors are due to the "[employer]" system interpretation. If 7PM-7AM, please contact night-coverage [email or web address]

## 2022-07-17 LAB — CULTURE, BLOOD (ROUTINE X 2)
Culture: NO GROWTH
Culture: NO GROWTH

## 2022-08-14 ENCOUNTER — Inpatient Hospital Stay (HOSPITAL_COMMUNITY)
Admission: EM | Admit: 2022-08-14 | Discharge: 2022-08-27 | DRG: 082 | Disposition: A | Discharge status: Skilled Nursing Facility | Payer: Medicare Other | Source: Skilled Nursing Facility | Attending: Internal Medicine | Admitting: Internal Medicine

## 2022-08-14 ENCOUNTER — Emergency Department (HOSPITAL_COMMUNITY): Payer: Medicare Other

## 2022-08-14 ENCOUNTER — Other Ambulatory Visit: Payer: Self-pay

## 2022-08-14 ENCOUNTER — Inpatient Hospital Stay (HOSPITAL_COMMUNITY): Payer: Medicare Other

## 2022-08-14 ENCOUNTER — Encounter (HOSPITAL_COMMUNITY): Payer: Self-pay | Admitting: Emergency Medicine

## 2022-08-14 DIAGNOSIS — Z808 Family history of malignant neoplasm of other organs or systems: Secondary | ICD-10-CM

## 2022-08-14 DIAGNOSIS — Z9841 Cataract extraction status, right eye: Secondary | ICD-10-CM

## 2022-08-14 DIAGNOSIS — S065XAA Traumatic subdural hemorrhage with loss of consciousness status unknown, initial encounter: Secondary | ICD-10-CM | POA: Diagnosis not present

## 2022-08-14 DIAGNOSIS — N390 Urinary tract infection, site not specified: Secondary | ICD-10-CM | POA: Diagnosis not present

## 2022-08-14 DIAGNOSIS — I714 Abdominal aortic aneurysm, without rupture, unspecified: Secondary | ICD-10-CM | POA: Diagnosis present

## 2022-08-14 DIAGNOSIS — Z8546 Personal history of malignant neoplasm of prostate: Secondary | ICD-10-CM | POA: Diagnosis not present

## 2022-08-14 DIAGNOSIS — Z87891 Personal history of nicotine dependence: Secondary | ICD-10-CM

## 2022-08-14 DIAGNOSIS — I2510 Atherosclerotic heart disease of native coronary artery without angina pectoris: Secondary | ICD-10-CM | POA: Diagnosis present

## 2022-08-14 DIAGNOSIS — G20A1 Parkinson's disease without dyskinesia, without mention of fluctuations: Secondary | ICD-10-CM | POA: Diagnosis present

## 2022-08-14 DIAGNOSIS — Z9049 Acquired absence of other specified parts of digestive tract: Secondary | ICD-10-CM

## 2022-08-14 DIAGNOSIS — G9341 Metabolic encephalopathy: Secondary | ICD-10-CM | POA: Diagnosis not present

## 2022-08-14 DIAGNOSIS — E876 Hypokalemia: Secondary | ICD-10-CM | POA: Diagnosis not present

## 2022-08-14 DIAGNOSIS — R000 Tachycardia, unspecified: Secondary | ICD-10-CM | POA: Diagnosis not present

## 2022-08-14 DIAGNOSIS — J189 Pneumonia, unspecified organism: Secondary | ICD-10-CM | POA: Diagnosis not present

## 2022-08-14 DIAGNOSIS — S065X9A Traumatic subdural hemorrhage with loss of consciousness of unspecified duration, initial encounter: Principal | ICD-10-CM | POA: Diagnosis present

## 2022-08-14 DIAGNOSIS — R053 Chronic cough: Secondary | ICD-10-CM | POA: Diagnosis present

## 2022-08-14 DIAGNOSIS — Z951 Presence of aortocoronary bypass graft: Secondary | ICD-10-CM | POA: Diagnosis not present

## 2022-08-14 DIAGNOSIS — R4182 Altered mental status, unspecified: Secondary | ICD-10-CM | POA: Diagnosis not present

## 2022-08-14 DIAGNOSIS — Z1152 Encounter for screening for COVID-19: Secondary | ICD-10-CM

## 2022-08-14 DIAGNOSIS — Z87442 Personal history of urinary calculi: Secondary | ICD-10-CM

## 2022-08-14 DIAGNOSIS — Z66 Do not resuscitate: Secondary | ICD-10-CM | POA: Diagnosis present

## 2022-08-14 DIAGNOSIS — N189 Chronic kidney disease, unspecified: Secondary | ICD-10-CM | POA: Diagnosis present

## 2022-08-14 DIAGNOSIS — Z7901 Long term (current) use of anticoagulants: Secondary | ICD-10-CM | POA: Diagnosis not present

## 2022-08-14 DIAGNOSIS — D638 Anemia in other chronic diseases classified elsewhere: Secondary | ICD-10-CM | POA: Diagnosis present

## 2022-08-14 DIAGNOSIS — I129 Hypertensive chronic kidney disease with stage 1 through stage 4 chronic kidney disease, or unspecified chronic kidney disease: Secondary | ICD-10-CM | POA: Diagnosis present

## 2022-08-14 DIAGNOSIS — Z803 Family history of malignant neoplasm of breast: Secondary | ICD-10-CM

## 2022-08-14 DIAGNOSIS — I10 Essential (primary) hypertension: Secondary | ICD-10-CM | POA: Diagnosis not present

## 2022-08-14 DIAGNOSIS — W19XXXA Unspecified fall, initial encounter: Secondary | ICD-10-CM | POA: Diagnosis present

## 2022-08-14 DIAGNOSIS — Z79899 Other long term (current) drug therapy: Secondary | ICD-10-CM | POA: Diagnosis not present

## 2022-08-14 DIAGNOSIS — Z885 Allergy status to narcotic agent status: Secondary | ICD-10-CM

## 2022-08-14 DIAGNOSIS — K219 Gastro-esophageal reflux disease without esophagitis: Secondary | ICD-10-CM | POA: Diagnosis present

## 2022-08-14 DIAGNOSIS — R296 Repeated falls: Secondary | ICD-10-CM | POA: Diagnosis present

## 2022-08-14 DIAGNOSIS — E785 Hyperlipidemia, unspecified: Secondary | ICD-10-CM | POA: Diagnosis present

## 2022-08-14 DIAGNOSIS — I482 Chronic atrial fibrillation, unspecified: Secondary | ICD-10-CM | POA: Diagnosis present

## 2022-08-14 DIAGNOSIS — Z9842 Cataract extraction status, left eye: Secondary | ICD-10-CM

## 2022-08-14 DIAGNOSIS — Z515 Encounter for palliative care: Secondary | ICD-10-CM

## 2022-08-14 DIAGNOSIS — Y92009 Unspecified place in unspecified non-institutional (private) residence as the place of occurrence of the external cause: Secondary | ICD-10-CM | POA: Diagnosis not present

## 2022-08-14 DIAGNOSIS — Z87440 Personal history of urinary (tract) infections: Secondary | ICD-10-CM

## 2022-08-14 DIAGNOSIS — M2570 Osteophyte, unspecified joint: Secondary | ICD-10-CM

## 2022-08-14 LAB — DIGOXIN LEVEL: Digoxin Level: 0.5 ng/mL — ABNORMAL LOW (ref 0.8–2.0)

## 2022-08-14 LAB — CBC
HCT: 34.2 % — ABNORMAL LOW (ref 39.0–52.0)
Hemoglobin: 10.3 g/dL — ABNORMAL LOW (ref 13.0–17.0)
MCH: 24.9 pg — ABNORMAL LOW (ref 26.0–34.0)
MCHC: 30.1 g/dL (ref 30.0–36.0)
MCV: 82.6 fL (ref 80.0–100.0)
Platelets: 214 10*3/uL (ref 150–400)
RBC: 4.14 MIL/uL — ABNORMAL LOW (ref 4.22–5.81)
RDW: 16.3 % — ABNORMAL HIGH (ref 11.5–15.5)
WBC: 9 10*3/uL (ref 4.0–10.5)
nRBC: 0 % (ref 0.0–0.2)

## 2022-08-14 LAB — COMPREHENSIVE METABOLIC PANEL
ALT: 7 U/L (ref 0–44)
AST: 17 U/L (ref 15–41)
Albumin: 3.7 g/dL (ref 3.5–5.0)
Alkaline Phosphatase: 86 U/L (ref 38–126)
Anion gap: 6 (ref 5–15)
BUN: 30 mg/dL — ABNORMAL HIGH (ref 8–23)
CO2: 23 mmol/L (ref 22–32)
Calcium: 9.7 mg/dL (ref 8.9–10.3)
Chloride: 109 mmol/L (ref 98–111)
Creatinine, Ser: 1.24 mg/dL (ref 0.61–1.24)
GFR, Estimated: 56 mL/min — ABNORMAL LOW (ref >60)
Glucose, Bld: 196 mg/dL — ABNORMAL HIGH (ref 70–99)
Potassium: 3.7 mmol/L (ref 3.5–5.1)
Sodium: 138 mmol/L (ref 135–145)
Total Bilirubin: 0.6 mg/dL (ref 0.3–1.2)
Total Protein: 6.5 g/dL (ref 6.5–8.1)

## 2022-08-14 LAB — CBG MONITORING, ED: Glucose-Capillary: 166 mg/dL — ABNORMAL HIGH (ref 70–99)

## 2022-08-14 LAB — SAMPLE TO BLOOD BANK

## 2022-08-14 LAB — PROTIME-INR
INR: 1.3 — ABNORMAL HIGH (ref 0.8–1.2)
Prothrombin Time: 16 seconds — ABNORMAL HIGH (ref 11.4–15.2)

## 2022-08-14 LAB — ETHANOL: Alcohol, Ethyl (B): <10 mg/dL (ref <10)

## 2022-08-14 LAB — LACTIC ACID, PLASMA: Lactic Acid, Venous: 2 mmol/L (ref 0.5–1.9)

## 2022-08-14 MED ORDER — EMPTY CONTAINERS FLEXIBLE MISC
900.0000 mg | Freq: Once | Status: AC
  Administered 2022-08-14: 900 mg via INTRAVENOUS
  Filled 2022-08-14: qty 90

## 2022-08-14 MED ORDER — BENZONATATE 100 MG PO CAPS: 200.0000 mg | ORAL_CAPSULE | Freq: Three times a day (TID) | ORAL | Status: DC | PRN

## 2022-08-14 MED ORDER — LEVETIRACETAM IN NACL 1500 MG/100ML IV SOLN
1500.0000 mg | Freq: Once | INTRAVENOUS | Status: AC
  Administered 2022-08-14: 1500 mg via INTRAVENOUS
  Filled 2022-08-14: qty 100

## 2022-08-14 MED ORDER — DEXTROMETHORPHAN POLISTIREX ER 30 MG/5ML PO SUER
30.0000 mg | Freq: Two times a day (BID) | ORAL | Status: DC | PRN
  Administered 2022-08-14: 30 mg via ORAL
  Filled 2022-08-14 (×2): qty 5

## 2022-08-14 MED ORDER — ACETAMINOPHEN 10 MG/ML IV SOLN
1000.0000 mg | Freq: Once | INTRAVENOUS | Status: AC
  Administered 2022-08-14: 1000 mg via INTRAVENOUS
  Filled 2022-08-14: qty 100

## 2022-08-14 NOTE — ED Provider Notes (Signed)
[place] DEPT Provider Note   CSN: [id number] Arrival date & time: 08/14/22  1721     History  Chief Complaint  Patient presents with   Loss of Consciousness    Adam Kane is a 88 y.o. male.  Patient as above with significant medical history as below, including afib on eliquis, CAD, CKD, HLD, parkinson's dz who presents to the ED with complaint of fall, ams Family at bedside, pt lives at [facility] Pt had a fall on Monday w/ head injury Fall again today at facility w/ again head injury On Eliquis, last dose this AM (afib) Daughter reports off balance since fall, intermittent incoherent speech, agitation He has mild HA, no n/v, no dib.   Level 5 caveat, ams  Past Medical History:  Diagnosis Date   A-fib ([place])    Abdominal aortic aneurysm (AAA), 30-34 mm diameter (HCC)    Arthritis    CAD (coronary artery disease)    Cancer (HCC)    prostate cancer 2009   Chronic kidney disease    kidney stone   Complication of anesthesia    if given anesthesia too quickly experiences nausea and vomiting    Diverticulosis of colon without hemorrhage    Dyslipidemia    Dysrhythmia    Early-onset Parkinson's disease    GERD (gastroesophageal reflux disease)    Heart murmur    childhood   History of kidney stones    PONV (postoperative nausea and vomiting)    S/P CABG (coronary artery bypass graft) 2001   Shortness of breath    mild exertion due to afib   Systemic hypertension    Urinary leakage     Past Surgical History:  Procedure Laterality Date   APPENDECTOMY     CARDIAC CATHETERIZATION  02/20/94   CATARACT EXTRACTION, BILATERAL     CHOLECYSTECTOMY     CORONARY ARTERY BYPASS GRAFT  2001   LIMA to LAD,SVG to PDA   CORONARY ARTERY BYPASS GRAFT     2 vessel 2001   CYSTOSCOPY WITH RETROGRADE PYELOGRAM, URETEROSCOPY AND STENT PLACEMENT Left 05/18/2014   Procedure: CYSTOSCOPY WITH RETROGRADE PYELOGRAM, URETEROSCOPY AND STENT  PLACEMENT;  Surgeon: Raynelle Bring, MD;  Location: [facility];  Service: Urology;  Laterality: Left;   CYSTOSCOPY WITH RETROGRADE PYELOGRAM, URETEROSCOPY AND STENT PLACEMENT Right 08/21/2014   Procedure: CYSTOSCOPY WITH RETROGRADE PYELOGRAM, URETEROSCOPY AND STENT PLACEMENT WITH STONE EXTRACTION basket;  Surgeon: Malka So, MD;  Location: [facility];  Service: Urology;  Laterality: Right;   HERNIA REPAIR     HOLMIUM LASER APPLICATION Left 1/82/9937   Procedure: HOLMIUM LASER APPLICATION;  Surgeon: Raynelle Bring, MD;  Location: [facility];  Service: Urology;  Laterality: Left;   LUMBAR LAMINECTOMY/DECOMPRESSION MICRODISCECTOMY Left 03/07/2013   Procedure: LUMBAR FOUR-FIVE EXTRAFORAMINAL LUMBAR LAMINECTOMY/DECOMPRESSION MICRODISCECTOMY 1 LEVEL;  Surgeon: Charlie Pitter, MD;  Location: [facility];  Service: Neurosurgery;  Laterality: Left;  Left lumbar four-five extraforaminal microdiskectomy   LUMBAR LAMINECTOMY/DECOMPRESSION MICRODISCECTOMY Left 01/15/2018   Procedure: Microdiscectomy - left - Lumbar Four -Lumbar Five extraforaminal;  Surgeon: Earnie Larsson, MD;  Location: [facility];  Service: Neurosurgery;  Laterality: Left;  Microdiscectomy - left - Lumbar Four -Lumbar Five extraforaminal   PROSTATECTOMY     TONSILLECTOMY       The history is provided by the patient and a relative. No language interpreter was used.  Loss of Consciousness Associated symptoms: confusion   Associated symptoms: no chest pain, no fever, no headaches, no nausea,  no palpitations, no shortness of breath and no vomiting        Home Medications Prior to Admission medications   Medication Sig Start Date End Date Taking? Authorizing Provider  acetaminophen (TYLENOL) 325 MG tablet Take 650 mg by mouth every 6 (six) hours as needed (for pain).   Yes [provider]  atorvastatin (LIPITOR) 40 MG tablet Take 40 mg by mouth at bedtime.    Yes [provider]  Carbidopa-Levodopa ER (SINEMET CR) 25-100 MG tablet controlled  release Take 2 tablets by mouth 3 (three) times daily. 9:30 am/12:30pm/3:30pm Patient taking differently: Take 2 tablets by mouth See admin instructions. Take 2 tablets by mouth in the morning, at 12 NOON, and at 4 PM 11/05/21  Yes Tat, Eustace Quail, DO  Cholecalciferol (VITAMIN D3) 1000 units CAPS Take 2,000 Units by mouth every evening.   Yes [provider]  digoxin (LANOXIN) 0.125 MG tablet Take 1 tablet (0.125 mg total) daily by mouth. Patient taking differently: Take 0.125 mg by mouth every evening. 07/10/17  Yes Croitoru, Mihai, MD  docusate sodium (COLACE) 100 MG capsule Take 100 mg by mouth daily.   Yes [provider]  ELIQUIS 5 MG TABS tablet Take 5 mg by mouth 2 (two) times daily. 01/16/21  Yes [provider]  ezetimibe (ZETIA) 10 MG tablet Take 10 mg by mouth every morning.    Yes [provider]  omeprazole (PRILOSEC) 20 MG capsule Take 20 mg by mouth daily before breakfast.   Yes [provider]  TIADYLT ER 180 MG 24 hr capsule Take 180 mg by mouth in the morning.   Yes [provider]  vitamin B-12 (CYANOCOBALAMIN) 1000 MCG tablet Take 1,000 mcg by mouth every evening.   Yes [provider]  AMBULATORY NON FORMULARY MEDICATION Walker, 2 wheels, 2 tennis balls G20 08/30/20   Tat, Eustace Quail, DO  diltiazem (TIAZAC) 180 MG 24 hr capsule Take 1 capsule (180 mg total) by mouth daily. Patient not taking: Reported on 08/14/2022 07/15/18   Croitoru, Mihai, MD      Allergies    Dilaudid [hydromorphone hcl] and Morphine and related    Review of Systems   Review of Systems  Constitutional:  Positive for appetite change. Negative for chills and fever.  HENT:  Negative for congestion and trouble swallowing.   Eyes:  Negative for pain and visual disturbance.  Respiratory:  Negative for cough and shortness of breath.   Cardiovascular:  Positive for syncope. Negative for chest pain and palpitations.  Gastrointestinal:  Negative for  abdominal pain, nausea and vomiting.  Genitourinary:  Positive for frequency and urgency. Negative for dysuria.  Musculoskeletal:  Positive for arthralgias.       Left index finger pain  Skin:  Positive for wound. Negative for pallor.       Scalp abrasion  Neurological:  Negative for speech difficulty, numbness and headaches.  Psychiatric/Behavioral:  Positive for agitation and confusion. The patient is not nervous/anxious.     Physical Exam Updated Vital Signs BP 125/73   Pulse 66   Temp 98.6 F (37 C) (Oral)   Resp 20   SpO2 97%  Physical Exam Vitals and nursing note reviewed.  Constitutional:      General: He is not in acute distress.    Appearance: Normal appearance. He is well-developed. He is not ill-appearing or diaphoretic.  HENT:     Head: Normocephalic. No raccoon eyes, Battle's sign, right periorbital erythema or left periorbital  erythema.     Jaw: There is normal jaw occlusion. No trismus.     Comments: Abrasion scalp    Right Ear: External ear normal.     Left Ear: External ear normal.     Mouth/Throat:     Mouth: Mucous membranes are moist.  Eyes:     General: No scleral icterus.    Extraocular Movements: Extraocular movements intact.     Pupils: Pupils are equal, round, and reactive to light.  Cardiovascular:     Rate and Rhythm: Normal rate and regular rhythm.     Pulses: Normal pulses.     Heart sounds: Normal heart sounds.  Pulmonary:     Effort: Pulmonary effort is normal. No respiratory distress.     Breath sounds: Normal breath sounds.  Abdominal:     General: Abdomen is flat.     Palpations: Abdomen is soft.     Tenderness: There is no abdominal tenderness.  Musculoskeletal:        General: Normal range of motion.       Hands:     Cervical back: Normal range of motion.     Right lower leg: No edema.     Left lower leg: No edema.     Comments: TTP at PIP, skin intact, nailbed intact, no pain along flexor tendon sheath, finger is held in slight  flexion and passive flexion does cause discomfort. Mild swelling.   Skin:    General: Skin is warm and dry.     Capillary Refill: Capillary refill takes less than 2 seconds.  Neurological:     Mental Status: He is alert and oriented to person, place, and time.     GCS: GCS eye subscore is 4. GCS verbal subscore is 5. GCS motor subscore is 6.     Cranial Nerves: Cranial nerves 2-12 are intact. No dysarthria or facial asymmetry.     Sensory: Sensation is intact. No sensory deficit.     Motor: Motor function is intact. No weakness.     Coordination: Coordination is intact.     Comments: Gait not tested 2/2 pt safety   Psychiatric:        Mood and Affect: Mood normal.        Behavior: Behavior normal.     ED Results / Procedures / Treatments   Labs (all labs ordered are listed, but only abnormal results are displayed) Labs Reviewed  CBC - Abnormal; Notable for the following components:      Result Value   RBC 4.14 (*)    Hemoglobin 10.3 (*)    HCT 34.2 (*)    MCH 24.9 (*)    RDW 16.3 (*)    All other components within normal limits  COMPREHENSIVE METABOLIC PANEL - Abnormal; Notable for the following components:   Glucose, Bld 196 (*)    BUN 30 (*)    GFR, Estimated 56 (*)    All other components within normal limits  LACTIC ACID, PLASMA - Abnormal; Notable for the following components:   Lactic Acid, Venous 2.0 (*)    All other components within normal limits  PROTIME-INR - Abnormal; Notable for the following components:   Prothrombin Time 16.0 (*)    INR 1.3 (*)    All other components within normal limits  CBG MONITORING, ED - Abnormal; Notable for the following components:   Glucose-Capillary 166 (*)    All other components within normal limits  RESP PANEL BY RT-PCR (RSV, FLU A&B, COVID)  RVPGX2  ETHANOL  URINALYSIS, ROUTINE W REFLEX MICROSCOPIC  DIGOXIN LEVEL  I-STAT CHEM 8, ED  SAMPLE TO BLOOD BANK    EKG EKG Interpretation  Date/Time:  Thursday August 14 2022 17:40:40 EST Ventricular Rate:  98 PR Interval:    QRS Duration: 110 QT Interval:  351 QTC Calculation: 449 R Axis:   -39 Text Interpretation: Atrial fibrillation Left axis deviation Anteroseptal infarct, old Repol abnrm suggests ischemia, lateral leads simialr to prev Confirmed by Wynona Dove ([id number]) on 08/14/2022 6:41:56 PM  Radiology CT HEAD WO CONTRAST  Result Date: 08/14/2022 CLINICAL DATA:  Fall, head trauma EXAM: CT HEAD WITHOUT CONTRAST CT CERVICAL SPINE WITHOUT CONTRAST TECHNIQUE: Multidetector CT imaging of the head and cervical spine was performed following the standard protocol without intravenous contrast. Multiplanar CT image reconstructions of the cervical spine were also generated. RADIATION DOSE REDUCTION: This exam was performed according to the departmental dose-optimization program which includes automated exposure control, adjustment of the mA and/or kV according to patient size and/or use of iterative reconstruction technique. COMPARISON:  CT head and cervical spine 07/11/2022 FINDINGS: CT HEAD FINDINGS Brain: Extensive subdural hematoma around the left hemisphere. This measures 4 mm in thickness in the left lateral frontal lobe. There is subdural blood inferior to the left temporal lobe measuring 14 mm in thickness. Subdural hematoma along the left tentorium and posterior falx measuring approximately 5 mm no subarachnoid hemorrhage Generalized atrophy. No acute infarct or mass. Mild chronic microvascular ischemic change in the white matter. No midline shift. Vascular: Negative for hyperdense vessel Skull: Negative Sinuses/Orbits: Mucosal edema paranasal sinuses. Complete opacification right sphenoid sinus with bony thickening. Mastoid sinus clear bilaterally. Other: None CT CERVICAL SPINE FINDINGS Alignment: Mild anterolisthesis C4-5. Skull base and vertebrae: Negative for fracture Soft tissues and spinal canal: No soft tissue mass or contusion. Atherosclerotic calcification  carotid bifurcation bilaterally. Disc levels: Cervical spondylosis with disc and facet degeneration throughout the cervical spine Upper chest: Lung apices clear bilaterally Other: None IMPRESSION: 1. Extensive subdural hematoma around the left hemisphere. This measures 4 mm in thickness in the left lateral frontal lobe. There is subdural blood inferior to the left temporal lobe measuring 14 mm in thickness. Subdural hematoma along the left tentorium and posterior falx measuring 5 mm. 2. Atrophy and chronic microvascular ischemic change. No acute infarct. 3. Negative for cervical spine fracture. 4. These results were called by telephone at the time of interpretation on 08/14/2022 at 6:24 pm to provider [facility] , who verbally acknowledged these results. Electronically Signed   By: Franchot Gallo M.D.   On: 08/14/2022 18:24   CT CERVICAL SPINE WO CONTRAST  Result Date: 08/14/2022 CLINICAL DATA:  Fall, head trauma EXAM: CT HEAD WITHOUT CONTRAST CT CERVICAL SPINE WITHOUT CONTRAST TECHNIQUE: Multidetector CT imaging of the head and cervical spine was performed following the standard protocol without intravenous contrast. Multiplanar CT image reconstructions of the cervical spine were also generated. RADIATION DOSE REDUCTION: This exam was performed according to the departmental dose-optimization program which includes automated exposure control, adjustment of the mA and/or kV according to patient size and/or use of iterative reconstruction technique. COMPARISON:  CT head and cervical spine 07/11/2022 FINDINGS: CT HEAD FINDINGS Brain: Extensive subdural hematoma around the left hemisphere. This measures 4 mm in thickness in the left lateral frontal lobe. There is subdural blood inferior to the left temporal lobe measuring 14 mm in thickness. Subdural hematoma along the left tentorium and posterior falx measuring approximately 5 mm no subarachnoid hemorrhage  Generalized atrophy. No acute infarct or mass. Mild  chronic microvascular ischemic change in the white matter. No midline shift. Vascular: Negative for hyperdense vessel Skull: Negative Sinuses/Orbits: Mucosal edema paranasal sinuses. Complete opacification right sphenoid sinus with bony thickening. Mastoid sinus clear bilaterally. Other: None CT CERVICAL SPINE FINDINGS Alignment: Mild anterolisthesis C4-5. Skull base and vertebrae: Negative for fracture Soft tissues and spinal canal: No soft tissue mass or contusion. Atherosclerotic calcification carotid bifurcation bilaterally. Disc levels: Cervical spondylosis with disc and facet degeneration throughout the cervical spine Upper chest: Lung apices clear bilaterally Other: None IMPRESSION: 1. Extensive subdural hematoma around the left hemisphere. This measures 4 mm in thickness in the left lateral frontal lobe. There is subdural blood inferior to the left temporal lobe measuring 14 mm in thickness. Subdural hematoma along the left tentorium and posterior falx measuring 5 mm. 2. Atrophy and chronic microvascular ischemic change. No acute infarct. 3. Negative for cervical spine fracture. 4. These results were called by telephone at the time of interpretation on 08/14/2022 at 6:24 pm to provider [facility] , who verbally acknowledged these results. Electronically Signed   By: Franchot Gallo M.D.   On: 08/14/2022 18:24   DG Pelvis Portable  Result Date: 08/14/2022 CLINICAL DATA:  [place], syncope EXAM: PORTABLE PELVIS 1-2 VIEWS COMPARISON:  11/17/2017 FINDINGS: Supine frontal view of the pelvis includes both hips. There are no acute displaced fractures. Mild symmetrical bilateral hip osteoarthritis. Sacroiliac joints are normal. Soft tissues are normal. IMPRESSION: 1. No acute displaced fracture. Electronically Signed   By: Randa Ngo M.D.   On: 08/14/2022 18:19   DG Chest Port 1 View  Result Date: 08/14/2022 CLINICAL DATA:  [place], syncope EXAM: PORTABLE CHEST 1 VIEW COMPARISON:  08/21/2014 FINDINGS: Single  frontal view of the chest demonstrates stable postsurgical changes from CABG. The cardiac silhouette is unremarkable. No airspace disease, effusion, or pneumothorax. Prominent vascular shadow in the right paratracheal region. No acute bony abnormalities. IMPRESSION: 1. No acute intrathoracic process. Electronically Signed   By: Randa Ngo M.D.   On: 08/14/2022 18:18   DG Hand Complete Right  Result Date: 08/14/2022 CLINICAL DATA:  Fall. EXAM: RIGHT HAND - COMPLETE 3+ VIEW COMPARISON:  None Available. FINDINGS: There is diffuse decreased bone mineralization. Severe index finger PIP joint space narrowing and peripheral osteophytosis. There is also severe joint space narrowing with moderate peripheral osteophytosis of the second and third and fifth DIP joints. Moderate thumb interphalangeal joint space narrowing and peripheral osteophytosis. Moderate joint space narrowing of the rest of the interphalangeal joints. Moderate thumb metacarpophalangeal joint space narrowing and peripheral osteophytosis. Moderate to severe thumb carpometacarpal joint space narrowing and peripheral osteophytosis. No acute fracture is seen.  No dislocation. IMPRESSION: 1. No acute fracture is seen. 2. Moderate-to-severe osteoarthritis. Electronically Signed   By: Yvonne Kendall M.D.   On: 08/14/2022 18:15    Procedures .Critical Care  Performed by: Jeanell Sparrow, DO Authorized by: Jeanell Sparrow, DO   Critical care provider statement:    Critical care time (minutes):  78   Critical care time was exclusive of:  Separately billable procedures and treating other patients   Critical care was necessary to treat or prevent imminent or life-threatening deterioration of the following conditions:  CNS failure or compromise and trauma   Critical care was time spent personally by me on the following activities:  Development of treatment plan with patient or surrogate, discussions with consultants, evaluation of patient's response to  treatment, examination of patient,  ordering and review of laboratory studies, ordering and review of radiographic studies, ordering and performing treatments and interventions, pulse oximetry, re-evaluation of patient's condition, review of old charts and obtaining history from patient or surrogate   Care discussed with: admitting provider       Medications Ordered in ED Medications  acetaminophen (OFIRMEV) IV 1,000 mg (has no administration in time range)  levETIRAcetam (KEPPRA) IVPB 1500 mg/ 100 mL premix (has no administration in time range)  coag fact Xa recombinant (ANDEXXA) low dose infusion 900 mg (900 mg Intravenous New Bag/Given 08/14/22 2008)    ED Course/ Medical Decision Making/ A&P Clinical Course as of 08/14/22 2122  Thu Aug 14, 2022  1838 [facility]  "IMPRESSION: 1. Extensive subdural hematoma around the left hemisphere. This measures 4 mm in thickness in the left lateral frontal lobe. There is subdural blood inferior to the left temporal lobe measuring 14 mm in thickness. Subdural hematoma along the left tentorium and posterior falx measuring 5 mm. 2. Atrophy and chronic microvascular ischemic change. No acute infarct. 3. Negative for cervical spine fracture."   [SG]  1858 Spoke w/ Dr Marcello Moores, start andexxa/keppra, admit ICU at [facility], rpt [facility] tomorrow am. he will stay on consult  [SG]  2041 Spoke w/ PCCM Dr Erin Fulling, will accept pt for admission. If no neuro ICU bed avail would recommend ED to ED txfr [SG]    Clinical Course User Index [SG] Jeanell Sparrow, DO                           Medical Decision Making Amount and/or Complexity of Data Reviewed Labs: ordered.  Risk Prescription drug management. Decision regarding hospitalization.   This patient presents to the ED with chief complaint(s) of fall w/ head injury, behavior change with pertinent past medical history of as above which further complicates the presenting complaint. The complaint involves an extensive  differential diagnosis and also carries with it a high risk of complications and morbidity.     Differential diagnoses for head trauma includes subdural hematoma, epidural hematoma, acute concussion, traumatic subarachnoid hemorrhage, cerebral contusions, etc. . Differential diagnoses for altered mental status includes but is not exclusive to alcohol, illicit or prescription medications, intracranial pathology such as stroke, intracerebral hemorrhage, fever or infectious causes including sepsis, hypoxemia, uremia, trauma, endocrine related disorders such as diabetes, hypoglycemia, thyroid-related diseases, etc.    Serious etiologies were considered.   The initial plan is to screening labs/imaging ordered in triage   Additional history obtained: Additional history obtained from family Records reviewed previous admission documents and home meds, prior labs/imaging  Independent labs interpretation:  The following labs were independently interpreted:  LA is elev at 2.0 CMP with mild elev to BUN from baseline, today is 30; Cr wnl CBC stable with similar hgb to 1 month ago, today is 10.3; 1 month ago was 11.7  Independent visualization of imaging: - I independently visualized the following imaging with scope of interpretation limited to determining acute life threatening conditions related to emergency care: CTH, which revealed sig subdural hematoma  Cardiac monitoring was reviewed and interpreted by myself which shows NSR  Treatment and Reassessment: Andexxa Keppra APAP >> symptoms unchanged  Consultation: - Consulted or discussed management/test interpretation w/ external professional:  Dr Marcello Moores NSGY recommends: give Andexxa, Keppra x7 days, Sbp <150, MAP 70-100. Rpt CTH tomorrow AM Spoke with Dr Erin Fulling PCCM: accepts pt for admission, no Neuro ICU bed avail, recommends ED to ED  transfer to expedite assessment. Spoke with Dr Langston Masker at [facility] who accepts pt for transfer ED to  ED  Consideration for admission or further workup: Admission was considered   Pt admitted for subdural hematoma on DOAC s/p reversal and keppra load. He has pain to left 2nd digit, osteophytosis on imaging, he is not septic, does not appear to be c/w flexortenosynovitis. Pt reports has been ongoing for a while but worsened after fall. Possible exacerbation by soft tissue injury? No fx on imaging.   Social Determinants of health: Social History   Tobacco Use   Smoking status: Former    Types: Cigarettes    Quit date: 09/01/1973    Years since quitting: 48.9   Smokeless tobacco: Never  Vaping Use   Vaping Use: Never used  Substance Use Topics   Alcohol use: No   Drug use: No            Final Clinical Impression(s) / ED Diagnoses Final diagnoses:  Subdural hematoma ([place])  Fall, initial encounter  Anticoagulated  Osteophytosis    Rx / DC Orders ED Discharge Orders     None         Jeanell Sparrow, DO 08/14/22 2123

## 2022-08-14 NOTE — H&P (Signed)
NAME:  Adam Kane, MRN:  [id number], DOB:  1933-04-11, LOS: 0 ADMISSION DATE:  08/14/2022, CONSULTATION DATE:  08/14/22 REFERRING MD:  Wynona Dove, MD CHIEF COMPLAINT:  subdrual hematoma   History of Present Illness:  88 year old male with atrial fibrillation on eliquis, CAD s/p CABG, CKD, hypertension, hyperlidpidemia and parkinson's disease who presented to the [facility] ER after a fall and altered mentation. He lives at [facility]. Family reports he had a fall on Monday and again today. He has intermittently incoherent speech and agitation per family.   In ER, head CT showed subdural hematoma around the left hemisphere. This measures 4 mm in thickness in the left lateral frontal lobe. There is subdural blood inferior to the left temporal lobe measuring 14 mm in thickness. Subdural hematoma along the left tentorium and posterior falx measuring 5 mm. No cervical spine fracture.  Neurosurgery was contacted and recommend admission to the ICU and repeat head CT.  PCCM called to admit the patient. He is being transferred from [facility] ED to [facility] ED.  He was admitted last month for UTI and generalized weakness.   Pertinent  Medical History   Past Medical History:  Diagnosis Date   A-fib [facility])    Abdominal aortic aneurysm (AAA), 30-34 mm diameter (HCC)    Arthritis    CAD (coronary artery disease)    Cancer ([place])    prostate cancer 2009   Chronic kidney disease    kidney stone   Complication of anesthesia    if given anesthesia too quickly experiences nausea and vomiting    Diverticulosis of colon without hemorrhage    Dyslipidemia    Dysrhythmia    Early-onset Parkinson's disease    GERD (gastroesophageal reflux disease)    Heart murmur    childhood   History of kidney stones    PONV (postoperative nausea and vomiting)    S/P CABG (coronary artery bypass graft) 2001   Shortness of breath    mild exertion due to afib   Systemic hypertension    Urinary leakage     Significant Hospital Events: Including procedures, antibiotic start and stop dates in addition to other pertinent events   12/14 admitted, subdural hematoma noted on head CT  Interim History / Subjective:    Objective   Blood pressure 125/73, pulse 66, temperature 98.6 F (37 C), temperature source Oral, resp. rate 20, SpO2 97 %.       No intake or output data in the 24 hours ending 08/14/22 2059 There were no vitals filed for this visit.  Examination: General: elderly male, no acute distress, resting in bed HENT: [place]/AT, moist mucous membranes, sclera anicteric Lungs: clear to auscultation, no wheezing Cardiovascular: irregularly irregular, no murmur Abdomen: soft, non-tender, non-distended, BS+ Extremities: warm, no edema Neuro: PERRL, moving all extremities GU: n/a  Resolved Hospital Problem list     Assessment & Plan:  Subdural Hematoma - in setting of anticoagulation - He received andexxa in the ER and prophylactic dose of Keppra - Neurosurgery following - Plan for repeat head CT tomorrow AM - q1hr neuro checks - Goal sBP <150  Paroxysmal Atrial Fibrillation - hold anticoagulation at this time  Parkinson's Disease - continue carbidopa-levodopa   Recent Hx of UTI - check UA  Chronic Cough - delsym PRN and tessalon perles PRN  Best Practice (right click and "Reselect all SmartList Selections" daily)   Diet/type: NPO w/ oral meds DVT prophylaxis: not indicated GI prophylaxis: N/A Lines: N/A  Foley:  N/A Code Status:  full code Last date of multidisciplinary goals of care discussion [12/14 with patient and family]  Labs   CBC: Recent Labs  Lab 08/14/22 1900  WBC 9.0  HGB 10.3*  HCT 34.2*  MCV 82.6  PLT 267    Basic Metabolic Panel: Recent Labs  Lab 08/14/22 1900  NA 138  K 3.7  CL 109  CO2 23  GLUCOSE 196*  BUN 30*  CREATININE 1.24  CALCIUM 9.7   GFR: CrCl cannot be calculated (Unknown ideal weight.). Recent Labs  Lab  08/14/22 1900  WBC 9.0  LATICACIDVEN 2.0*    Liver Function Tests: Recent Labs  Lab 08/14/22 1900  AST 17  ALT 7  ALKPHOS 86  BILITOT 0.6  PROT 6.5  ALBUMIN 3.7   No results for input(s): "LIPASE", "AMYLASE" in the last 168 hours. No results for input(s): "AMMONIA" in the last 168 hours.  ABG No results found for: "PHART", "PCO2ART", "PO2ART", "HCO3", "TCO2", "ACIDBASEDEF", "O2SAT"   Coagulation Profile: Recent Labs  Lab 08/14/22 1900  INR 1.3*    Cardiac Enzymes: No results for input(s): "CKTOTAL", "CKMB", "CKMBINDEX", "TROPONINI" in the last 168 hours.  HbA1C: Hgb A1c MFr Bld  Date/Time Value Ref Range Status  03/04/2013 07:26 PM 5.6 <5.7 % Final    Comment:    (NOTE)                                                                       According to the ADA Clinical Practice Recommendations for 2011, when HbA1c is used as a screening test:  >=6.5%   Diagnostic of Diabetes Mellitus           (if abnormal result is confirmed) 5.7-6.4%   Increased risk of developing Diabetes Mellitus References:Diagnosis and Classification of Diabetes Mellitus,Diabetes TIWP,8099,83(JASNK 1):S62-S69 and Standards of Medical Care in         Diabetes - 2011,Diabetes NLZJ,6734,19 (Suppl 1):S11-S61.    CBG: Recent Labs  Lab 08/14/22 1826  GLUCAP 166*    Review of Systems:   Review of Systems  Constitutional:  Negative for chills, fever, malaise/fatigue and weight loss.  HENT:  Negative for congestion, sinus pain and sore throat.   Eyes: Negative.   Respiratory:  Negative for cough, hemoptysis, sputum production, shortness of breath and wheezing.   Cardiovascular:  Negative for chest pain, palpitations, orthopnea, claudication and leg swelling.  Gastrointestinal:  Negative for abdominal pain, heartburn, nausea and vomiting.  Genitourinary: Negative.   Musculoskeletal:  Positive for joint pain. Negative for myalgias.  Skin:  Negative for rash.  Neurological:  Positive for  headaches. Negative for weakness.  Endo/Heme/Allergies: Negative.   Psychiatric/Behavioral: Negative.       Past Medical History:  He,  has a past medical history of A-fib ([place]), Abdominal aortic aneurysm (AAA), 30-34 mm diameter ([place]), Arthritis, CAD (coronary artery disease), Cancer ([place]), Chronic kidney disease, Complication of anesthesia, Diverticulosis of colon without hemorrhage, Dyslipidemia, Dysrhythmia, Early-onset Parkinson's disease, GERD (gastroesophageal reflux disease), Heart murmur, History of kidney stones, PONV (postoperative nausea and vomiting), S/P CABG (coronary artery bypass graft) (2001), Shortness of breath, Systemic hypertension, and Urinary leakage.   Surgical History:   Past Surgical History:  Procedure Laterality Date  APPENDECTOMY     CARDIAC CATHETERIZATION  02/20/94   CATARACT EXTRACTION, BILATERAL     CHOLECYSTECTOMY     CORONARY ARTERY BYPASS GRAFT  2001   LIMA to LAD,SVG to PDA   CORONARY ARTERY BYPASS GRAFT     2 vessel 2001   CYSTOSCOPY WITH RETROGRADE PYELOGRAM, URETEROSCOPY AND STENT PLACEMENT Left 05/18/2014   Procedure: CYSTOSCOPY WITH RETROGRADE PYELOGRAM, URETEROSCOPY AND STENT PLACEMENT;  Surgeon: Raynelle Bring, MD;  Location: [facility];  Service: Urology;  Laterality: Left;   CYSTOSCOPY WITH RETROGRADE PYELOGRAM, URETEROSCOPY AND STENT PLACEMENT Right 08/21/2014   Procedure: CYSTOSCOPY WITH RETROGRADE PYELOGRAM, URETEROSCOPY AND STENT PLACEMENT WITH STONE EXTRACTION basket;  Surgeon: Malka So, MD;  Location: [facility];  Service: Urology;  Laterality: Right;   HERNIA REPAIR     HOLMIUM LASER APPLICATION Left 9/32/6712   Procedure: HOLMIUM LASER APPLICATION;  Surgeon: Raynelle Bring, MD;  [facility];  Service: Urology;  Laterality: Left;   LUMBAR LAMINECTOMY/DECOMPRESSION MICRODISCECTOMY Left 03/07/2013   Procedure: LUMBAR FOUR-FIVE EXTRAFORAMINAL LUMBAR LAMINECTOMY/DECOMPRESSION MICRODISCECTOMY 1 LEVEL;  Surgeon: Charlie Pitter, MD;  Location: [place]  NEURO ORS;  Service: Neurosurgery;  Laterality: Left;  Left lumbar four-five extraforaminal microdiskectomy   LUMBAR LAMINECTOMY/DECOMPRESSION MICRODISCECTOMY Left 01/15/2018   Procedure: Microdiscectomy - left - Lumbar Four -Lumbar Five extraforaminal;  Surgeon: Earnie Larsson, MD;  Location: [place];  Service: Neurosurgery;  Laterality: Left;  Microdiscectomy - left - Lumbar Four -Lumbar Five extraforaminal   PROSTATECTOMY     TONSILLECTOMY       Social History:   reports that he quit smoking about 48 years ago. His smoking use included cigarettes. He has never used smokeless tobacco. He reports that he does not drink alcohol and does not use drugs.   Family History:  His family history includes AAA (abdominal aortic aneurysm) in his father; Breast cancer in his mother; Cancer in his mother; Throat cancer in his father.   Allergies Allergies  Allergen Reactions   Dilaudid [Hydromorphone Hcl] Other (See Comments)    PASSES OUT   Morphine And Related Other (See Comments)    PASSES OUT     Home Medications  Prior to Admission medications   Medication Sig Start Date End Date Taking? Authorizing Provider  acetaminophen (TYLENOL) 325 MG tablet Take 650 mg by mouth every 6 (six) hours as needed (for pain).   Yes [provider]  atorvastatin (LIPITOR) 40 MG tablet Take 40 mg by mouth at bedtime.    Yes [provider]  Carbidopa-Levodopa ER (SINEMET CR) 25-100 MG tablet controlled release Take 2 tablets by mouth 3 (three) times daily. 9:30 am/12:30pm/3:30pm Patient taking differently: Take 2 tablets by mouth See admin instructions. Take 2 tablets by mouth in the morning, at 12 NOON, and at 4 PM 11/05/21  Yes Tat, Eustace Quail, DO  Cholecalciferol (VITAMIN D3) 1000 units CAPS Take 2,000 Units by mouth every evening.   Yes [provider]  digoxin (LANOXIN) 0.125 MG tablet Take 1 tablet (0.125 mg total) daily by mouth. Patient taking differently: Take 0.125 mg by mouth every  evening. 07/10/17  Yes Croitoru, Mihai, MD  docusate sodium (COLACE) 100 MG capsule Take 100 mg by mouth daily.   Yes [provider]  ELIQUIS 5 MG TABS tablet Take 5 mg by mouth 2 (two) times daily. 01/16/21  Yes [provider]  ezetimibe (ZETIA) 10 MG tablet Take 10 mg by mouth every morning.    Yes [provider]  omeprazole ([place])  20 MG capsule Take 20 mg by mouth daily before breakfast.   Yes [provider]  TIADYLT ER 180 MG 24 hr capsule Take 180 mg by mouth in the morning.   Yes [provider]  vitamin B-12 (CYANOCOBALAMIN) 1000 MCG tablet Take 1,000 mcg by mouth every evening.   Yes [provider]  AMBULATORY NON FORMULARY MEDICATION Walker, 2 wheels, 2 tennis balls G20 08/30/20   Tat, Eustace Quail, DO  diltiazem (TIAZAC) 180 MG 24 hr capsule Take 1 capsule (180 mg total) by mouth daily. Patient not taking: Reported on 08/14/2022 07/15/18   Croitoru, Dani Gobble, MD     Critical care time: n/a    Freda Jackson, MD [place] Office: [phone]   See Amion for personal pager PCCM on call pager [phone] until 7pm. Please call Elink 7p-7a. [phone]

## 2022-08-14 NOTE — ED Provider Triage Note (Signed)
Emergency Medicine Provider Triage Evaluation Note  Adam Kane , a 88 y.o. male  was evaluated in triage.  Pt complains of fall. Pt with hx of chronic afib on eliquis and digoxin.  Had 2 falls.  One yesterday when he hits his head and fell again today with unsure LOC.  Report some urinary discomfort  Review of Systems  Positive: As above Negative: As above  Physical Exam  BP (!) 136/109 (BP Location: Left Arm)   Pulse 75   Temp 98.5 F (36.9 C) (Oral)   Resp 16   SpO2 99%  Gen:   Awake, no distress   Resp:  Normal effort  MSK:   Moves extremities without difficulty  Other:    Medical Decision Making  Medically screening exam initiated at 5:40 PM.  Appropriate orders placed.  EDDRICK DILONE was informed that the remainder of the evaluation will be completed by another provider, this initial triage assessment does not replace that evaluation, and the importance of remaining in the ED until their evaluation is complete.  Activate level 2 trauma due to elderly fall on blood thinner   Domenic Moras, PA-C 08/14/22 1747

## 2022-08-14 NOTE — ED Triage Notes (Signed)
Pt BIB [facility] from home. Pt reports while walking to his bathroom he fell and passed out. Pt reports this happened yesterday as well. Pt alert and oriented x 4.

## 2022-08-14 NOTE — ED Notes (Signed)
Patient is unable to get up due to head injury.  External catheter placed.  Patient tolerated well.  2nd I placed to the left forearm for medication administration.  The Andexxa continues in the right forearm.  No s/sx of adverse reaction.

## 2022-08-15 ENCOUNTER — Inpatient Hospital Stay (HOSPITAL_COMMUNITY): Payer: Medicare Other

## 2022-08-15 LAB — BASIC METABOLIC PANEL
Anion gap: 9 (ref 5–15)
BUN: 20 mg/dL (ref 8–23)
CO2: 22 mmol/L (ref 22–32)
Calcium: 9.4 mg/dL (ref 8.9–10.3)
Chloride: 107 mmol/L (ref 98–111)
Creatinine, Ser: 1.09 mg/dL (ref 0.61–1.24)
GFR, Estimated: >60 mL/min (ref >60)
Glucose, Bld: 115 mg/dL — ABNORMAL HIGH (ref 70–99)
Potassium: 3.5 mmol/L (ref 3.5–5.1)
Sodium: 138 mmol/L (ref 135–145)

## 2022-08-15 LAB — RESP PANEL BY RT-PCR (RSV, FLU A&B, COVID)  RVPGX2
Influenza A by PCR: NEGATIVE
Influenza B by PCR: NEGATIVE
Resp Syncytial Virus by PCR: NEGATIVE
SARS Coronavirus 2 by RT PCR: NEGATIVE

## 2022-08-15 LAB — CBC
HCT: 33.6 % — ABNORMAL LOW (ref 39.0–52.0)
Hemoglobin: 10.4 g/dL — ABNORMAL LOW (ref 13.0–17.0)
MCH: 24.9 pg — ABNORMAL LOW (ref 26.0–34.0)
MCHC: 31 g/dL (ref 30.0–36.0)
MCV: 80.6 fL (ref 80.0–100.0)
Platelets: 204 10*3/uL (ref 150–400)
RBC: 4.17 MIL/uL — ABNORMAL LOW (ref 4.22–5.81)
RDW: 16.4 % — ABNORMAL HIGH (ref 11.5–15.5)
WBC: 6.4 10*3/uL (ref 4.0–10.5)
nRBC: 0 % (ref 0.0–0.2)

## 2022-08-15 MED ORDER — LABETALOL HCL 5 MG/ML IV SOLN
20.0000 mg | INTRAVENOUS | Status: DC | PRN
  Administered 2022-08-15: 20 mg via INTRAVENOUS
  Filled 2022-08-15 (×2): qty 4

## 2022-08-15 MED ORDER — DOCUSATE SODIUM 100 MG PO CAPS: 100.0000 mg | ORAL_CAPSULE | Freq: Two times a day (BID) | ORAL | Status: DC | PRN

## 2022-08-15 MED ORDER — POLYETHYLENE GLYCOL 3350 17 G PO PACK: 17.0000 g | PACK | Freq: Every day | ORAL | Status: DC | PRN

## 2022-08-15 MED ORDER — SODIUM CHLORIDE 0.9 % IV SOLN: INTRAVENOUS | Status: DC

## 2022-08-15 MED ORDER — DILTIAZEM HCL 60 MG PO TABS
60.0000 mg | ORAL_TABLET | Freq: Three times a day (TID) | ORAL | Status: DC
  Administered 2022-08-15 – 2022-08-27 (×35): 60 mg via ORAL
  Filled 2022-08-15 (×37): qty 1

## 2022-08-15 MED ORDER — DIGOXIN 125 MCG PO TABS
0.1250 mg | ORAL_TABLET | Freq: Every day | ORAL | Status: DC
  Administered 2022-08-15: 0.125 mg via ORAL
  Filled 2022-08-15 (×2): qty 1

## 2022-08-15 MED ORDER — POTASSIUM CHLORIDE CRYS ER 20 MEQ PO TBCR
40.0000 meq | EXTENDED_RELEASE_TABLET | Freq: Once | ORAL | Status: AC
  Administered 2022-08-15: 40 meq via ORAL
  Filled 2022-08-15: qty 2

## 2022-08-15 MED ORDER — HALOPERIDOL LACTATE 5 MG/ML IJ SOLN
INTRAMUSCULAR | Status: AC
  Filled 2022-08-15: qty 1

## 2022-08-15 MED ORDER — HALOPERIDOL LACTATE 5 MG/ML IJ SOLN
5.0000 mg | Freq: Once | INTRAMUSCULAR | Status: AC
  Administered 2022-08-15: 5 mg via INTRAVENOUS

## 2022-08-15 MED ORDER — ATORVASTATIN CALCIUM 40 MG PO TABS
40.0000 mg | ORAL_TABLET | Freq: Every day | ORAL | Status: DC
  Administered 2022-08-15 – 2022-08-27 (×12): 40 mg via ORAL
  Filled 2022-08-15 (×13): qty 1

## 2022-08-15 NOTE — Consult Note (Signed)
CC: SDH  HPI:     Patient is a 88 y.o. male with PD, CKD, CAD s/p CABG, Afib on Eliquis who suffered several recent falls at [facility].  He was noted to have increased confusion and agitation per family.  He was taken to [facility] where CT head showed broad left SDH.  He was given reversal agent and transferred to [facility].     Patient Active Problem List   Diagnosis Date Noted   Subdural hematoma ([place]) 08/14/2022   UTI (urinary tract infection) 07/12/2022   Dyslipidemia (high LDL; low HDL) 07/15/2018   Long term current use of anticoagulant 07/15/2018   Lumbar disc herniation 01/15/2018   Parkinson's disease 08/01/2017   Coronary artery disease involving native coronary artery of native heart without angina pectoris 07/10/2017   Acute renal insufficiency 08/22/2014   AAA (abdominal aortic aneurysm) without rupture ([place]) 08/22/2014   Diverticulosis of colon without hemorrhage 08/22/2014   Right ureteral stone 08/21/2014   Hyperlipidemia 05/31/2013   Essential hypertension 03/03/2013   Leukocytosis, unspecified 03/03/2013   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 03/02/2013   Chronic a-fib ([place]) 03/02/2013   Past Medical History:  Diagnosis Date   A-fib ([place])    Abdominal aortic aneurysm (AAA), 30-34 mm diameter (HCC)    Arthritis    CAD (coronary artery disease)    Cancer (HCC)    prostate cancer 2009   Chronic kidney disease    kidney stone   Complication of anesthesia    if given anesthesia too quickly experiences nausea and vomiting    Diverticulosis of colon without hemorrhage    Dyslipidemia    Dysrhythmia    Early-onset Parkinson's disease    GERD (gastroesophageal reflux disease)    Heart murmur    childhood   History of kidney stones    PONV (postoperative nausea and vomiting)    S/P CABG (coronary artery bypass graft) 2001   Shortness of breath    mild exertion due to afib   Systemic hypertension    Urinary leakage     Past  Surgical History:  Procedure Laterality Date   APPENDECTOMY     CARDIAC CATHETERIZATION  02/20/94   CATARACT EXTRACTION, BILATERAL     CHOLECYSTECTOMY     CORONARY ARTERY BYPASS GRAFT  2001   LIMA to LAD,SVG to PDA   CORONARY ARTERY BYPASS GRAFT     2 vessel 2001   CYSTOSCOPY WITH RETROGRADE PYELOGRAM, URETEROSCOPY AND STENT PLACEMENT Left 05/18/2014   Procedure: CYSTOSCOPY WITH RETROGRADE PYELOGRAM, URETEROSCOPY AND STENT PLACEMENT;  Surgeon: Raynelle Bring, MD;  [facility];  Service: Urology;  Laterality: Left;   CYSTOSCOPY WITH RETROGRADE PYELOGRAM, URETEROSCOPY AND STENT PLACEMENT Right 08/21/2014   Procedure: CYSTOSCOPY WITH RETROGRADE PYELOGRAM, URETEROSCOPY AND STENT PLACEMENT WITH STONE EXTRACTION basket;  Surgeon: Malka So, MD;  [facility];  Service: Urology;  Laterality: Right;   HERNIA REPAIR     HOLMIUM LASER APPLICATION Left 3/61/4431   Procedure: HOLMIUM LASER APPLICATION;  Surgeon: Raynelle Bring, MD;  [facility];  Service: Urology;  Laterality: Left;   LUMBAR LAMINECTOMY/DECOMPRESSION MICRODISCECTOMY Left 03/07/2013   Procedure: LUMBAR FOUR-FIVE EXTRAFORAMINAL LUMBAR LAMINECTOMY/DECOMPRESSION MICRODISCECTOMY 1 LEVEL;  Surgeon: Charlie Pitter, MD;  Location: [place] NEUR[facility];  Service: Neurosurgery;  Laterality: Left;  Left lumbar four-five extraforaminal microdiskectomy   LUMBAR LAMINECTOMY/DECOMPRESSION MICRODISCECTOMY Left 01/15/2018   Procedure: Microdiscectomy - left - Lumbar Four -Lumbar Five extraforaminal;  Surgeon: Earnie Larsson, MD;  Location:  [place] [facility];  Service: Neurosurgery;  Laterality: Left;  Microdiscectomy - left - Lumbar Four -Lumbar Five extraforaminal   PROSTATECTOMY     TONSILLECTOMY      (Not in a hospital admission)  Allergies  Allergen Reactions   Dilaudid [Hydromorphone Hcl] Other (See Comments)    PASSES OUT   Morphine And Related Other (See Comments)    PASSES OUT    Social History   Tobacco Use   Smoking status: Former    Types:  Cigarettes    Quit date: 09/01/1973    Years since quitting: 48.9   Smokeless tobacco: Never  Substance Use Topics   Alcohol use: No    Family History  Problem Relation Age of Onset   Cancer Mother        bone   Breast cancer Mother    AAA (abdominal aortic aneurysm) Father    Throat cancer Father        mouth     Review of Systems Review of systems not obtained due to patient factors.  Objective:   Patient Vitals for the past 8 hrs:  BP Temp Temp src Pulse Resp SpO2  08/15/22 1230 (!) 153/84 -- -- (!) 119 (!) 21 97 %  08/15/22 1115 (!) 147/91 -- -- (!) 127 18 96 %  08/15/22 1045 (!) 128/90 -- -- 60 13 99 %  08/15/22 0945 (!) 151/93 (!) 97.5 F (36.4 C) -- (!) 108 17 94 %  08/15/22 0900 (!) 131/108 -- -- -- 14 99 %  08/15/22 0815 (!) 120/46 -- -- 81 (!) 23 99 %  08/15/22 0715 125/88 -- -- (!) 115 15 98 %  08/15/22 0630 (!) 137/94 -- -- 99 17 95 %  08/15/22 0600 124/89 -- -- 95 15 97 %  08/15/22 0556 -- 97.6 F (36.4 C) Oral -- -- --   No intake/output data recorded. No intake/output data recorded.      General : Alert, cooperative, no distress, appears stated age   Head:  Normocephalic/atraumatic    Eyes: PERRL, conjunctiva/corneas clear, EOM's intact. Fundi could not be visualized Neck: Supple Chest:  Respirations unlabored Chest wall: no tenderness or deformity Heart: Regular rate and rhythm Abdomen: Soft, nontender and nondistended Extremities: warm and well-perfused Skin: normal turgor, color and texture Neurologic:  Alert, oriented to person.  Confused but verbalizes well.  Right lid lag.  No pronator drift.  Visual fields grossly full       Data ReviewCBC:  Lab Results  Component Value Date   WBC 6.4 08/15/2022   RBC 4.17 (L) 08/15/2022   BMP:  Lab Results  Component Value Date   GLUCOSE 115 (H) 08/15/2022   CO2 22 08/15/2022   BUN 20 08/15/2022   CREATININE 1.09 08/15/2022   CALCIUM 9.4 08/15/2022   Radiology review:  CT head and repeat  CT head personally reviewed.  Broad left convexity, falcine, and subtemporal SDH.  No significant mass effect.  Repeat Ct head stable.  Assessment:   Principal Problem:   Subdural hematoma (HCC)   Plan:   - would recommend f/u CT head without contrast in 2 weeks. - Keppra 500 mg BID x 7 days - given stability of CT scan, likely does not need neuro ICU but monitored bed appropriate - will need neurosurgical f/u in 2 weeks and continue to hold Eliquis - ok to start pharmacologic dvt prophylaxis tomorrow  I discussed plan of care with medical team

## 2022-08-15 NOTE — ED Notes (Signed)
Requested secretary to page CCM again for patients change in mentation.

## 2022-08-15 NOTE — ED Notes (Signed)
Report given to receiving [facility] RN.  Family aware of transfer of care.

## 2022-08-15 NOTE — ED Notes (Signed)
Admitting MD at bedside.

## 2022-08-15 NOTE — ED Notes (Addendum)
EDP notified of AMS, CT head order recieved

## 2022-08-15 NOTE — ED Notes (Signed)
Call to [facility], patient was not on the board for transfer.  Transfer process initiated.  Dr. Pearline Cables is sending, Dr Langston Masker is accepting.

## 2022-08-15 NOTE — ED Notes (Signed)
MD notified about pts BP an HR. No new orders at this time

## 2022-08-15 NOTE — Progress Notes (Signed)
NAME:  Adam Kane, MRN:  [id number], DOB:  1933/07/13, LOS: 1 ADMISSION DATE:  08/14/2022, CONSULTATION DATE:  08/14/22 REFERRING MD:  Wynona Dove, MD CHIEF COMPLAINT:  subdrual hematoma   History of Present Illness:  88 year old male with atrial fibrillation on eliquis, CAD s/p CABG, CKD, hypertension, hyperlidpidemia and parkinson's disease who presented to the [facility] ER after a fall and altered mentation. He lives at [facility]. Family reports he had a fall on Monday and again today. He has intermittently incoherent speech and agitation per family.   In ER, head CT showed subdural hematoma around the left hemisphere. This measures 4 mm in thickness in the left lateral frontal lobe. There is subdural blood inferior to the left temporal lobe measuring 14 mm in thickness. Subdural hematoma along the left tentorium and posterior falx measuring 5 mm. No cervical spine fracture.  Neurosurgery was contacted and recommend admission to the ICU and repeat head CT.  PCCM called to admit the patient. He is being transferred from [facility] ED to [facility] ED.  He was admitted last month for UTI and generalized weakness.   Pertinent  Medical History   Past Medical History:  Diagnosis Date   A-fib ([place])    Abdominal aortic aneurysm (AAA), 30-34 mm diameter (HCC)    Arthritis    CAD (coronary artery disease)    Cancer (HCC)    prostate cancer 2009   Chronic kidney disease    kidney stone   Complication of anesthesia    if given anesthesia too quickly experiences nausea and vomiting    Diverticulosis of colon without hemorrhage    Dyslipidemia    Dysrhythmia    Early-onset Parkinson's disease    GERD (gastroesophageal reflux disease)    Heart murmur    childhood   History of kidney stones    PONV (postoperative nausea and vomiting)    S/P CABG (coronary artery bypass graft) 2001   Shortness of breath    mild exertion due to afib   Systemic hypertension    Urinary leakage     Significant Hospital Events: Including procedures, antibiotic start and stop dates in addition to other pertinent events   12/14 admitted, subdural hematoma noted on head CT  Interim History / Subjective:  Daughter at bedside.  No focal changes.  Repeat CTH stable.  Awaiting ICU bed.   Objective   Blood pressure (!) 147/91, pulse (!) 127, temperature (!) 97.5 F (36.4 C), resp. rate 18, SpO2 96 %.       No intake or output data in the 24 hours ending 08/15/22 1204 There were no vitals filed for this visit.  Examination: General:  Elderly male lying on stretcher in NAD HEENT: MM pink/moist, pupils 3/reactive, slight drooping of right eye lid Neuro: Awake, verbal, f/c, oriented to self, MAE CV: irir, no murmur PULM:  non labored, clear anteriorly, on RA GI: soft, bs+, NT Extremities: warm/dry, no LE edema Skin: no rashes  Labs> K 3.5, sCr 1.09, Hgb 10.4  Resolved Hospital Problem list     Assessment & Plan:   Subdural Hematoma - on Eliquis, s/p andexxa - repeat CTH stable - Spoke with Dr. Marcello Moores, will see in consult but ok to monitor in PCU.   - Keppra per NSGY for seizure ppx, likely 7 days - q4 neuro checks  - H/H stable  - passed swallow screen, can advance diet as tolerated  - PT/ OT  Paroxysmal Atrial Fibrillation - cont holding  Eliquis.  Given age and falls, will need to consider risk/ benefit ratio of restarting  - resume home digoxin and diltiazem   Parkinson's Disease - continue carbidopa-levodopa   Recent Hx of UTI - UA with small leukoctyes, many bacteria and 21-50 bacteria> send for UC - hold abx for now as he remains afebrile and normal WBC  Chronic Cough - delsym PRN and tessalon perles PRN  HLD - resume lipitor, zetia   Best Practice (right click and "Reselect all SmartList Selections" daily)   Diet/type: clear liquids> advance as tolerated  DVT prophylaxis: SCD GI prophylaxis: N/A Lines: N/A Foley:  N/A Code Status:  DNR Last date  of multidisciplinary goals of care discussion [12/14 with patient and family]  Daughter updated at bedside by Dr. Valeta Harms.   Will sign out to [facility] to assume care as of 12/16, PCCM available as needed then.   Labs   CBC: Recent Labs  Lab 08/14/22 1900 08/15/22 0646  WBC 9.0 6.4  HGB 10.3* 10.4*  HCT 34.2* 33.6*  MCV 82.6 80.6  PLT 214 204     Basic Metabolic Panel: Recent Labs  Lab 08/14/22 1900 08/15/22 0646  NA 138 138  K 3.7 3.5  CL 109 107  CO2 23 22  GLUCOSE 196* 115*  BUN 30* 20  CREATININE 1.24 1.09  CALCIUM 9.7 9.4    GFR: CrCl cannot be calculated (Unknown ideal weight.). Recent Labs  Lab 08/14/22 1900 08/15/22 0646  WBC 9.0 6.4  LATICACIDVEN 2.0*  --      Liver Function Tests: Recent Labs  Lab 08/14/22 1900  AST 17  ALT 7  ALKPHOS 86  BILITOT 0.6  PROT 6.5  ALBUMIN 3.7    No results for input(s): "LIPASE", "AMYLASE" in the last 168 hours. No results for input(s): "AMMONIA" in the last 168 hours.  ABG No results found for: "PHART", "PCO2ART", "PO2ART", "HCO3", "TCO2", "ACIDBASEDEF", "O2SAT"   Coagulation Profile: Recent Labs  Lab 08/14/22 1900  INR 1.3*     Cardiac Enzymes: No results for input(s): "CKTOTAL", "CKMB", "CKMBINDEX", "TROPONINI" in the last 168 hours.  HbA1C: Hgb A1c MFr Bld  Date/Time Value Ref Range Status  03/04/2013 07:26 PM 5.6 <5.7 % Final    Comment:    (NOTE)                                                                       According to the ADA Clinical Practice Recommendations for 2011, when HbA1c is used as a screening test:  >=6.5%   Diagnostic of Diabetes Mellitus           (if abnormal result is confirmed) 5.7-6.4%   Increased risk of developing Diabetes Mellitus References:Diagnosis and Classification of Diabetes Mellitus,Diabetes GBTD,1761,60(VPXTG 1):S62-S69 and Standards of Medical Care in         Diabetes - 2011,Diabetes GYIR,4854,62 (Suppl 1):S11-S61.    CBG: Recent Labs  Lab  08/14/22 1826  GLUCAP 166*    Critical care time: n/a      Amanda Cockayne [facility] 08/15/2022, 12:05 PM  See Amion for pager If no response to pager, please call PCCM consult pager After 7:00 pm call Elink

## 2022-08-15 NOTE — ED Notes (Signed)
Critical care paged for patients change in mental status.

## 2022-08-15 NOTE — ED Notes (Signed)
Report given to [facility] RN at [facility]

## 2022-08-15 NOTE — ED Notes (Signed)
Pt is a transfer via [facility] for further neuro workup, bilateral IVs, AO4.

## 2022-08-15 NOTE — ED Notes (Signed)
Safety mitts placed on patient due to patient becoming increasingly confused and attempting to pull off monitoring equipment and Ivs.

## 2022-08-15 NOTE — ED Notes (Signed)
Pt arrived in [place] with primary RN and this RN. Pt remains altered, not following directions, pulling at gown/cords.

## 2022-08-15 NOTE — ED Notes (Signed)
Patient attempting to remove all monitoring equipment. Patient is disoriented to time, place, situation which is a change from baseline.

## 2022-08-15 NOTE — ED Notes (Signed)
Critical care called this RN. CCM to come re-evaluate patient.

## 2022-08-15 NOTE — ED Notes (Signed)
Patient has returned from [place].  Family aware of pending transfer.  HOB remains elevated.  No new neuro dx noted.

## 2022-08-16 ENCOUNTER — Inpatient Hospital Stay (HOSPITAL_COMMUNITY): Payer: Medicare Other

## 2022-08-16 DIAGNOSIS — S065XAA Traumatic subdural hemorrhage with loss of consciousness status unknown, initial encounter: Secondary | ICD-10-CM | POA: Diagnosis not present

## 2022-08-16 LAB — BASIC METABOLIC PANEL
Anion gap: 10 (ref 5–15)
BUN: 21 mg/dL (ref 8–23)
CO2: 22 mmol/L (ref 22–32)
Calcium: 9.5 mg/dL (ref 8.9–10.3)
Chloride: 104 mmol/L (ref 98–111)
Creatinine, Ser: 1.01 mg/dL (ref 0.61–1.24)
GFR, Estimated: >60 mL/min (ref >60)
Glucose, Bld: 141 mg/dL — ABNORMAL HIGH (ref 70–99)
Potassium: 3.7 mmol/L (ref 3.5–5.1)
Sodium: 136 mmol/L (ref 135–145)

## 2022-08-16 LAB — MAGNESIUM: Magnesium: 1.9 mg/dL (ref 1.7–2.4)

## 2022-08-16 LAB — CBC
HCT: 34.8 % — ABNORMAL LOW (ref 39.0–52.0)
Hemoglobin: 10.9 g/dL — ABNORMAL LOW (ref 13.0–17.0)
MCH: 24.9 pg — ABNORMAL LOW (ref 26.0–34.0)
MCHC: 31.3 g/dL (ref 30.0–36.0)
MCV: 79.5 fL — ABNORMAL LOW (ref 80.0–100.0)
Platelets: 215 10*3/uL (ref 150–400)
RBC: 4.38 MIL/uL (ref 4.22–5.81)
RDW: 16.4 % — ABNORMAL HIGH (ref 11.5–15.5)
WBC: 8.6 10*3/uL (ref 4.0–10.5)
nRBC: 0 % (ref 0.0–0.2)

## 2022-08-16 MED ORDER — DIGOXIN 0.25 MG/ML IJ SOLN
0.1250 mg | Freq: Every day | INTRAMUSCULAR | Status: DC
  Administered 2022-08-16 – 2022-08-18 (×3): 0.125 mg via INTRAVENOUS
  Filled 2022-08-16 (×3): qty 0.5

## 2022-08-16 MED ORDER — LEVETIRACETAM IN NACL 500 MG/100ML IV SOLN
500.0000 mg | Freq: Two times a day (BID) | INTRAVENOUS | Status: AC
  Administered 2022-08-16 – 2022-08-22 (×14): 500 mg via INTRAVENOUS
  Filled 2022-08-16 (×14): qty 100

## 2022-08-16 MED ORDER — HALOPERIDOL LACTATE 5 MG/ML IJ SOLN
1.0000 mg | Freq: Four times a day (QID) | INTRAMUSCULAR | Status: DC | PRN
  Administered 2022-08-21 – 2022-08-23 (×3): 2 mg via INTRAVENOUS
  Filled 2022-08-16 (×4): qty 1

## 2022-08-16 MED ORDER — SODIUM CHLORIDE 0.9 % IV SOLN
2.0000 g | Freq: Every day | INTRAVENOUS | Status: DC
  Administered 2022-08-16 – 2022-08-20 (×5): 2 g via INTRAVENOUS
  Filled 2022-08-16 (×5): qty 20

## 2022-08-16 MED ORDER — SODIUM CHLORIDE 0.9 % IV SOLN: INTRAVENOUS | Status: DC

## 2022-08-16 MED ORDER — CARBIDOPA-LEVODOPA ER 25-100 MG PO TBCR
2.0000 | EXTENDED_RELEASE_TABLET | Freq: Three times a day (TID) | ORAL | Status: DC
  Administered 2022-08-17 – 2022-08-27 (×30): 2 via ORAL
  Filled 2022-08-16 (×35): qty 2

## 2022-08-16 MED ORDER — SODIUM CHLORIDE 0.9 % IV SOLN: INTRAVENOUS | Status: DC | PRN

## 2022-08-16 NOTE — Progress Notes (Addendum)
PROGRESS NOTE    Adam Kane  BSJ:[id number] DOB: [birth year] DOA: 08/14/2022 PCP: Crist Infante, MD   Brief Narrative:  88 year old male with history of atrial fibrillation on Eliquis, CAD status post CABG, hypertension, hyperlipidemia, Parkinson's disease, hospitalization for UTI last month presented after a fall and altered mental status.  On presentation, CT of the head showed subdural hematoma around the left hemisphere.  Patient was admitted to ICU service.  Neurosurgery was consulted and recommended conservative management.  Patient received adnexa.  Repeat CT head was stable.  He was started on Keppra.  He was transferred to [facility] service from 08/16/2022 onwards.  Assessment & Plan:   Subdural hematoma after a fall -Was on Eliquis prior to presentation: Received adnexxa -Repeat CT head stable.  Neurosurgery evaluation appreciated: Recommended conservative management with follow-up CT head without contrast in 2 weeks.  He has been started on Keppra twice daily for 7 days.  Continue to hold Eliquis till reevaluation by neurosurgery as an outpatient -Will hold off on chemical DVT prophylaxis because of persistent encephalopathy  Acute metabolic encephalopathy -Possibly from above.  Still drowsy this morning. -Repeat CT of the head done yesterday afternoon showed stable subdural hematoma.  If mental status does not improve, might need MRI of brain. -Keppra can also lead to drowsiness. -Follow guidance.  Monitor mental status.  SLP/PT/OT eval.  Chronic atrial fibrillation -Continue digoxin and Cardizem if able to swallow orally.  Eliquis plan as above.  Intermittently tachycardic.  Fever Possible UTI -Questionable cause.  Patient has history of recent UTI and is currently spiking temperatures.  Urine culture has already been sent.  Will check blood cultures.  Check chest x-ray.  Start Rocephin.  Hyperlipidemia -Continue statin if able to swallow  Hypertension -monitor blood  pressure.  Intermittently on the higher side.  Parkinson's disease -Resume carbidopa levodopa if able to swallow clinically  Anemia of chronic disease -from chronic illnesses.  Goals of care -Palliative care consultation for goals of care discussion.   DVT prophylaxis: SCDs Code Status: DNR Family Communication: Daughter on phone Disposition Plan: Status is: Inpatient Remains inpatient appropriate because: Of severity of illness    Consultants: PCCM/neurosurgery  Procedures: None  Antimicrobials: None   Subjective: Patient seen and examined at bedside at temperature spike this morning.  Very poor historian, wakes up slightly, hardly answers any questions.  No agitation, vomiting reported.  Objective: Vitals:   08/16/22 0200 08/16/22 0307 08/16/22 0524 08/16/22 0750  BP:  (!) 138/94 (!) 167/98 132/87  Pulse: 96 97  94  Resp: '16 16  14  '$ Temp:  (!) 101.1 F (38.4 C)  98.8 F (37.1 C)  TempSrc:  Axillary  Oral  SpO2:  100%  96%    Intake/Output Summary (Last 24 hours) at 08/16/2022 1130 Last data filed at 08/15/2022 1540 Gross per 24 hour  Intake 220 ml  Output 900 ml  Net -680 ml   There were no vitals filed for this visit.  Examination:  General exam: Appears calm and comfortable.  Looks chronically ill and deconditioned.  Currently on room air. Respiratory system: Bilateral decreased breath sounds at bases with scattered crackles Cardiovascular system: S1 & S2 heard, Rate controlled Gastrointestinal system: Abdomen is nondistended, soft and nontender. Normal bowel sounds heard. Extremities: No cyanosis, clubbing, edema  Central nervous system: Sleepy, wakes up slightly, hardly answers any questions.  No focal neurological deficits.  No abnormal body movements noted  skin: No rashes, lesions or ulcers Psychiatry: Could not be  assessed because of mental status    Data Reviewed: I have personally reviewed following labs and imaging studies  CBC: Recent  Labs  Lab 08/14/22 1900 08/15/22 0646 08/16/22 0321  WBC 9.0 6.4 8.6  HGB 10.3* 10.4* 10.9*  HCT 34.2* 33.6* 34.8*  MCV 82.6 80.6 79.5*  PLT 214 204 902   Basic Metabolic Panel: Recent Labs  Lab 08/14/22 1900 08/15/22 0646 08/16/22 0321  NA 138 138 136  K 3.7 3.5 3.7  CL 109 107 104  CO2 '23 22 22  '$ GLUCOSE 196* 115* 141*  BUN 30* 20 21  CREATININE 1.24 1.09 1.01  CALCIUM 9.7 9.4 9.5  MG  --   --  1.9   GFR: CrCl cannot be calculated (Unknown ideal weight.). Liver Function Tests: Recent Labs  Lab 08/14/22 1900  AST 17  ALT 7  ALKPHOS 86  BILITOT 0.6  PROT 6.5  ALBUMIN 3.7   No results for input(s): "LIPASE", "AMYLASE" in the last 168 hours. No results for input(s): "AMMONIA" in the last 168 hours. Coagulation Profile: Recent Labs  Lab 08/14/22 1900  INR 1.3*   Cardiac Enzymes: No results for input(s): "CKTOTAL", "CKMB", "CKMBINDEX", "TROPONINI" in the last 168 hours. BNP (last 3 results) No results for input(s): "PROBNP" in the last 8760 hours. HbA1C: No results for input(s): "HGBA1C" in the last 72 hours. CBG: Recent Labs  Lab 08/14/22 1826  GLUCAP 166*   Lipid Profile: No results for input(s): "CHOL", "HDL", "LDLCALC", "TRIG", "CHOLHDL", "LDLDIRECT" in the last 72 hours. Thyroid Function Tests: No results for input(s): "TSH", "T4TOTAL", "FREET4", "T3FREE", "THYROIDAB" in the last 72 hours. Anemia Panel: No results for input(s): "VITAMINB12", "FOLATE", "FERRITIN", "TIBC", "IRON", "RETICCTPCT" in the last 72 hours. Sepsis Labs: Recent Labs  Lab 08/14/22 1900  LATICACIDVEN 2.0*    Recent Results (from the past 240 hour(s))  Resp panel by RT-PCR (RSV, Flu A&B, Covid) Anterior Nasal Swab     Status: None   Collection Time: 08/14/22  8:13 PM   Specimen: Anterior Nasal Swab  Result Value Ref Range Status   SARS Coronavirus 2 by RT PCR NEGATIVE NEGATIVE Final    Comment: (NOTE) SARS-CoV-2 target nucleic acids are NOT DETECTED.  The SARS-CoV-2  RNA is generally detectable in upper respiratory specimens during the acute phase of infection. The lowest concentration of SARS-CoV-2 viral copies this assay can detect is 138 copies/mL. A negative result does not preclude SARS-Cov-2 infection and should not be used as the sole basis for treatment or other patient management decisions. A negative result may occur with  improper specimen collection/handling, submission of specimen other than nasopharyngeal swab, presence of viral mutation(s) within the areas targeted by this assay, and inadequate number of viral copies(<138 copies/mL). A negative result must be combined with clinical observations, patient history, and epidemiological information. The expected result is Negative.  Fact Sheet for Patients:  EntrepreneurPulse.com.au  Fact Sheet for Healthcare Providers:  IncredibleEmployment.be  This test is no t yet approved or cleared by the [place] FDA and  has been authorized for detection and/or diagnosis of SARS-CoV-2 by FDA under an Emergency Use Authorization (EUA). This EUA will remain  in effect (meaning this test can be used) for the duration of the COVID-19 declaration under Section 564(b)(1) of the Act, 21 U.S.C.section 360bbb-3(b)(1), unless the authorization is terminated  or revoked sooner.       Influenza A by PCR NEGATIVE NEGATIVE Final   Influenza B by PCR NEGATIVE NEGATIVE Final  Comment: (NOTE) The Xpert Xpress SARS-CoV-2/FLU/RSV plus assay is intended as an aid in the diagnosis of influenza from Nasopharyngeal swab specimens and should not be used as a sole basis for treatment. Nasal washings and aspirates are unacceptable for Xpert Xpress SARS-CoV-2/FLU/RSV testing.  Fact Sheet for Patients: EntrepreneurPulse.com.au  Fact Sheet for Healthcare Providers: IncredibleEmployment.be  This test is not yet approved or cleared by the  [place] FDA and has been authorized for detection and/or diagnosis of SARS-CoV-2 by FDA under an Emergency Use Authorization (EUA). This EUA will remain in effect (meaning this test can be used) for the duration of the COVID-19 declaration under Section 564(b)(1) of the Act, 21 U.S.C. section 360bbb-3(b)(1), unless the authorization is terminated or revoked.     Resp Syncytial Virus by PCR NEGATIVE NEGATIVE Final    Comment: (NOTE) Fact Sheet for Patients: EntrepreneurPulse.com.au  Fact Sheet for Healthcare Providers: IncredibleEmployment.be  This test is not yet approved or cleared by the [place] FDA and has been authorized for detection and/or diagnosis of SARS-CoV-2 by FDA under an Emergency Use Authorization (EUA). This EUA will remain in effect (meaning this test can be used) for the duration of the COVID-19 declaration under Section 564(b)(1) of the Act, 21 U.S.C. section 360bbb-3(b)(1), unless the authorization is terminated or revoked.  Performed at [facility], [place] [address] [place], [place] [address]          Radiology Studies: CT Head Wo Contrast  Result Date: 08/15/2022 CLINICAL DATA:  Mental status change, unknown cause. Syncope. Subdural hematoma. EXAM: CT HEAD WITHOUT CONTRAST TECHNIQUE: Contiguous axial images were obtained from the base of the skull through the vertex without intravenous contrast. RADIATION DOSE REDUCTION: This exam was performed according to the departmental dose-optimization program which includes automated exposure control, adjustment of the mA and/or kV according to patient size and/or use of iterative reconstruction technique. COMPARISON:  Head CT earlier today FINDINGS: Brain: Subdural hematoma over the left lateral cerebral convexity, falx, and left tentorium measures up to 6 mm in thickness, unchanged. Subdural hematoma component inferior to the left temporal lobe  measures up to 17 mm in thickness, also unchanged. 3 mm of rightward midline shift is unchanged. There is the suggestion of increasing hypoattenuation within cortex and white matter of the anterior left temporal lobe, however assessment is limited by motion and skull base streak artifact. No new intracranial hemorrhage is identified. There is mild cerebral atrophy. Hypodensities elsewhere in the cerebral white matter bilaterally are nonspecific but compatible with mild chronic small vessel ischemic disease. Vascular: No hyperdense vessel. Skull: No acute fracture or suspicious osseous lesion. Sinuses/Orbits: Chronic right sphenoid sinusitis. No significant mastoid fluid. Bilateral cataract extraction. Other: None. IMPRESSION: 1. Unchanged left-sided subdural hematoma and 3 mm of rightward midline shift. 2. Increased hypodensity in the anterior left temporal lobe, indeterminate for worsening edema (such as from infarct or infection) versus artifact. Consider brain MRI for further evaluation. Electronically Signed   By: Logan Bores M.D.   On: 08/15/2022 16:35   CT HEAD WO CONTRAST (5MM)  Result Date: 08/15/2022 CLINICAL DATA:  Subdural hematoma Subdural hemorrhage subdural hematoma follow up EXAM: CT HEAD WITHOUT CONTRAST TECHNIQUE: Contiguous axial images were obtained from the base of the skull through the vertex without intravenous contrast. RADIATION DOSE REDUCTION: This exam was performed according to the departmental dose-optimization program which includes automated exposure control, adjustment of the mA and/or kV according to patient size and/or use of iterative reconstruction technique. COMPARISON:  CT  head 08/14/2022 6:13 p.m. FINDINGS: Brain: Cerebral ventricle sizes are concordant with the degree of cerebral volume loss. Patchy and confluent areas of decreased attenuation are noted throughout the deep and periventricular white matter of the cerebral hemispheres bilaterally, compatible with chronic  microvascular ischemic disease. No evidence of large-territorial acute infarction. No parenchymal hemorrhage. No mass lesion. Stable left subdural hematoma along the left calvarial convexity with extension along the left tentorium and falx cerebri measuring up to 6 mm. Subdural blood inferior to the left temporal lobe measures up to 17 mm (6:46). No new extra-axial collection. Stable 3 mm left-to-right midline shift. No hydrocephalus. Basilar cisterns are patent. Vascular: No hyperdense vessel. Skull: No acute fracture or focal lesion. Sinuses/Orbits: Right sphenoid sinus complete opacification with associated calcifications suggestive of chronic or fungal sinusitis. Otherwise paranasal sinuses and mastoid air cells are clear. Bilateral lens replacement. The orbits are unremarkable. Other: None. IMPRESSION: 1. Stable left subdural hematoma with extension along the left tentorium, falx cerebri, and inferior to the temporal lobe. Stable 3 mm left-to-right midline shift. 2. No new acute intracranial abnormality. Electronically Signed   By: Iven Finn M.D.   On: 08/15/2022 00:43   CT HEAD WO CONTRAST  Result Date: 08/14/2022 CLINICAL DATA:  Fall, head trauma EXAM: CT HEAD WITHOUT CONTRAST CT CERVICAL SPINE WITHOUT CONTRAST TECHNIQUE: Multidetector CT imaging of the head and cervical spine was performed following the standard protocol without intravenous contrast. Multiplanar CT image reconstructions of the cervical spine were also generated. RADIATION DOSE REDUCTION: This exam was performed according to the departmental dose-optimization program which includes automated exposure control, adjustment of the mA and/or kV according to patient size and/or use of iterative reconstruction technique. COMPARISON:  CT head and cervical spine 07/11/2022 FINDINGS: CT HEAD FINDINGS Brain: Extensive subdural hematoma around the left hemisphere. This measures 4 mm in thickness in the left lateral frontal lobe. There is  subdural blood inferior to the left temporal lobe measuring 14 mm in thickness. Subdural hematoma along the left tentorium and posterior falx measuring approximately 5 mm no subarachnoid hemorrhage Generalized atrophy. No acute infarct or mass. Mild chronic microvascular ischemic change in the white matter. No midline shift. Vascular: Negative for hyperdense vessel Skull: Negative Sinuses/Orbits: Mucosal edema paranasal sinuses. Complete opacification right sphenoid sinus with bony thickening. Mastoid sinus clear bilaterally. Other: None CT CERVICAL SPINE FINDINGS Alignment: Mild anterolisthesis C4-5. Skull base and vertebrae: Negative for fracture Soft tissues and spinal canal: No soft tissue mass or contusion. Atherosclerotic calcification carotid bifurcation bilaterally. Disc levels: Cervical spondylosis with disc and facet degeneration throughout the cervical spine Upper chest: Lung apices clear bilaterally Other: None IMPRESSION: 1. Extensive subdural hematoma around the left hemisphere. This measures 4 mm in thickness in the left lateral frontal lobe. There is subdural blood inferior to the left temporal lobe measuring 14 mm in thickness. Subdural hematoma along the left tentorium and posterior falx measuring 5 mm. 2. Atrophy and chronic microvascular ischemic change. No acute infarct. 3. Negative for cervical spine fracture. 4. These results were called by telephone at the time of interpretation on 08/14/2022 at 6:24 pm to provider [facility] , who verbally acknowledged these results. Electronically Signed   By: Franchot Gallo M.D.   On: 08/14/2022 18:24   CT CERVICAL SPINE WO CONTRAST  Result Date: 08/14/2022 CLINICAL DATA:  Fall, head trauma EXAM: CT HEAD WITHOUT CONTRAST CT CERVICAL SPINE WITHOUT CONTRAST TECHNIQUE: Multidetector CT imaging of the head and cervical spine was performed following the standard protocol  without intravenous contrast. Multiplanar CT image reconstructions of the cervical  spine were also generated. RADIATION DOSE REDUCTION: This exam was performed according to the departmental dose-optimization program which includes automated exposure control, adjustment of the mA and/or kV according to patient size and/or use of iterative reconstruction technique. COMPARISON:  CT head and cervical spine 07/11/2022 FINDINGS: CT HEAD FINDINGS Brain: Extensive subdural hematoma around the left hemisphere. This measures 4 mm in thickness in the left lateral frontal lobe. There is subdural blood inferior to the left temporal lobe measuring 14 mm in thickness. Subdural hematoma along the left tentorium and posterior falx measuring approximately 5 mm no subarachnoid hemorrhage Generalized atrophy. No acute infarct or mass. Mild chronic microvascular ischemic change in the white matter. No midline shift. Vascular: Negative for hyperdense vessel Skull: Negative Sinuses/Orbits: Mucosal edema paranasal sinuses. Complete opacification right sphenoid sinus with bony thickening. Mastoid sinus clear bilaterally. Other: None CT CERVICAL SPINE FINDINGS Alignment: Mild anterolisthesis C4-5. Skull base and vertebrae: Negative for fracture Soft tissues and spinal canal: No soft tissue mass or contusion. Atherosclerotic calcification carotid bifurcation bilaterally. Disc levels: Cervical spondylosis with disc and facet degeneration throughout the cervical spine Upper chest: Lung apices clear bilaterally Other: None IMPRESSION: 1. Extensive subdural hematoma around the left hemisphere. This measures 4 mm in thickness in the left lateral frontal lobe. There is subdural blood inferior to the left temporal lobe measuring 14 mm in thickness. Subdural hematoma along the left tentorium and posterior falx measuring 5 mm. 2. Atrophy and chronic microvascular ischemic change. No acute infarct. 3. Negative for cervical spine fracture. 4. These results were called by telephone at the time of interpretation on 08/14/2022 at 6:24 pm  to provider [facility] , who verbally acknowledged these results. Electronically Signed   By: Franchot Gallo M.D.   On: 08/14/2022 18:24   DG Pelvis Portable  Result Date: 08/14/2022 CLINICAL DATA:  [place], syncope EXAM: PORTABLE PELVIS 1-2 VIEWS COMPARISON:  11/17/2017 FINDINGS: Supine frontal view of the pelvis includes both hips. There are no acute displaced fractures. Mild symmetrical bilateral hip osteoarthritis. Sacroiliac joints are normal. Soft tissues are normal. IMPRESSION: 1. No acute displaced fracture. Electronically Signed   By: Randa Ngo M.D.   On: 08/14/2022 18:19   DG Chest Port 1 View  Result Date: 08/14/2022 CLINICAL DATA:  [place], syncope EXAM: PORTABLE CHEST 1 VIEW COMPARISON:  08/21/2014 FINDINGS: Single frontal view of the chest demonstrates stable postsurgical changes from CABG. The cardiac silhouette is unremarkable. No airspace disease, effusion, or pneumothorax. Prominent vascular shadow in the right paratracheal region. No acute bony abnormalities. IMPRESSION: 1. No acute intrathoracic process. Electronically Signed   By: Randa Ngo M.D.   On: 08/14/2022 18:18   DG Hand Complete Right  Result Date: 08/14/2022 CLINICAL DATA:  Fall. EXAM: RIGHT HAND - COMPLETE 3+ VIEW COMPARISON:  None Available. FINDINGS: There is diffuse decreased bone mineralization. Severe index finger PIP joint space narrowing and peripheral osteophytosis. There is also severe joint space narrowing with moderate peripheral osteophytosis of the second and third and fifth DIP joints. Moderate thumb interphalangeal joint space narrowing and peripheral osteophytosis. Moderate joint space narrowing of the rest of the interphalangeal joints. Moderate thumb metacarpophalangeal joint space narrowing and peripheral osteophytosis. Moderate to severe thumb carpometacarpal joint space narrowing and peripheral osteophytosis. No acute fracture is seen.  No dislocation. IMPRESSION: 1. No acute fracture is seen. 2.  Moderate-to-severe osteoarthritis. Electronically Signed   By: Yvonne Kendall M.D.   On: 08/14/2022  18:15        Scheduled Meds:  atorvastatin  40 mg Oral Daily   digoxin  0.125 mg Intravenous Daily   diltiazem  60 mg Oral Q8H   Continuous Infusions:        Aline August, MD [facility] 08/16/2022, 11:30 AM

## 2022-08-16 NOTE — Evaluation (Signed)
Clinical/Bedside Swallow Evaluation Patient Details  Name: Adam Kane MRN: [id number] Date of Birth: Dec 06, 1932  Today's Date: 08/16/2022 Time: SLP Start Time (ACUTE ONLY): 53 SLP Stop Time (ACUTE ONLY): 1330 SLP Time Calculation (min) (ACUTE ONLY): 30 min  Past Medical History:  Past Medical History:  Diagnosis Date   A-fib ([place])    Abdominal aortic aneurysm (AAA), 30-34 mm diameter (HCC)    Arthritis    CAD (coronary artery disease)    Cancer (HCC)    prostate cancer 2009   Chronic kidney disease    kidney stone   Complication of anesthesia    if given anesthesia too quickly experiences nausea and vomiting    Diverticulosis of colon without hemorrhage    Dyslipidemia    Dysrhythmia    Early-onset Parkinson's disease    GERD (gastroesophageal reflux disease)    Heart murmur    childhood   History of kidney stones    PONV (postoperative nausea and vomiting)    S/P CABG (coronary artery bypass graft) 2001   Shortness of breath    mild exertion due to afib   Systemic hypertension    Urinary leakage    Past Surgical History:  Past Surgical History:  Procedure Laterality Date   APPENDECTOMY     CARDIAC CATHETERIZATION  02/20/94   CATARACT EXTRACTION, BILATERAL     CHOLECYSTECTOMY     CORONARY ARTERY BYPASS GRAFT  2001   LIMA to LAD,SVG to PDA   CORONARY ARTERY BYPASS GRAFT     2 vessel 2001   CYSTOSCOPY WITH RETROGRADE PYELOGRAM, URETEROSCOPY AND STENT PLACEMENT Left 05/18/2014   Procedure: CYSTOSCOPY WITH RETROGRADE PYELOGRAM, URETEROSCOPY AND STENT PLACEMENT;  Surgeon: Raynelle Bring, MD;  Location: [facility];  Service: Urology;  Laterality: Left;   CYSTOSCOPY WITH RETROGRADE PYELOGRAM, URETEROSCOPY AND STENT PLACEMENT Right 08/21/2014   Procedure: CYSTOSCOPY WITH RETROGRADE PYELOGRAM, URETEROSCOPY AND STENT PLACEMENT WITH STONE EXTRACTION basket;  Surgeon: Malka So, MD;  Location: [facility];  Service: Urology;  Laterality: Right;   HERNIA REPAIR     HOLMIUM  LASER APPLICATION Left 9/89/2119   Procedure: HOLMIUM LASER APPLICATION;  Surgeon: Raynelle Bring, MD;  Location: [facility];  Service: Urology;  Laterality: Left;   LUMBAR LAMINECTOMY/DECOMPRESSION MICRODISCECTOMY Left 03/07/2013   Procedure: LUMBAR FOUR-FIVE EXTRAFORAMINAL LUMBAR LAMINECTOMY/DECOMPRESSION MICRODISCECTOMY 1 LEVEL;  Surgeon: Charlie Pitter, MD;  Location: [facility];  Service: Neurosurgery;  Laterality: Left;  Left lumbar four-five extraforaminal microdiskectomy   LUMBAR LAMINECTOMY/DECOMPRESSION MICRODISCECTOMY Left 01/15/2018   Procedure: Microdiscectomy - left - Lumbar Four -Lumbar Five extraforaminal;  Surgeon: Earnie Larsson, MD;  Location: [facility];  Service: Neurosurgery;  Laterality: Left;  Microdiscectomy - left - Lumbar Four -Lumbar Five extraforaminal   PROSTATECTOMY     TONSILLECTOMY     HPI:  88 year old male with history of atrial fibrillation on Eliquis, CAD status post CABG, hypertension, hyperlipidemia, Parkinson's disease, hospitalization for UTI last month presented after a fall and altered mental status.  On presentation, CT of the head showed subdural hematoma around the left hemisphere.  Patient was admitted to ICU service.  Neurosurgery was consulted and recommended conservative management.  Patient received adnexa.  Repeat CT head was stable.  He was started on Keppra.  He was transferred to [facility] service from 08/16/2022 onwards. Unfortunately, ongoing AMS and reduced alertness.    Assessment / Plan / Recommendation  Clinical Impression  Adam Kane presents with an acute exacerbation (d/t level of alertness) on chronic dysphagia (r/t Parkinson's disease).  Daughter present and aids as historian. She reports he has intermittent coughing with intake at baseline d/t his PD. He is presently very lethargic, requiring cues for participation. He is partially elevated, on his side at family request (he is uncomfortable sitting on his buttocks). He had limited participation in oral  mech exam, but woke up more for PO trials. He consumed water via tsp with some mild anterior spillage noted. Pureed was consumed with no s/s aspiration or dysphagia. Graham cracker resulted in oral residue. Thin liquid via cup resulted in immediate strong and prolonged cough response with oxygen desaturation down to 72%, quickly rising back into the 90s. He had shaking versus tremors of arms/upper body and is known to be febrile.   Pt is recommended for NPO status pending improvement in alertness. He may have ice chips for oral comfort and purees for meds/pleasure ONLY ONCE ALERT. SLP service to re-evaluate once more alert. RN present at end of evaluation and in agreement with plan.    SLP Visit Diagnosis: Dysphagia, oropharyngeal phase (R13.12)    Aspiration Risk  Severe aspiration risk;Risk for inadequate nutrition/hydration    Diet Recommendation NPO except meds;Ice chips PRN after oral care   Medication Administration: Crushed with puree Supervision: Full supervision/cueing for compensatory strategies     Recommendations for follow up therapy are one component of a multi-disciplinary discharge planning process, led by the attending physician.  Recommendations may be updated based on patient status, additional functional criteria and insurance authorization.  Follow up Recommendations Other (comment) (TBD)      Functional Status Assessment Patient has had a recent decline in their functional status and demonstrates the ability to make significant improvements in function in a reasonable and predictable amount of time.  Frequency and Duration min 2x/week          Prognosis Prognosis for Safe Diet Advancement: Fair      Swallow Study   General Date of Onset: 08/14/22 HPI: 88 year old male with history of atrial fibrillation on Eliquis, CAD status post CABG, hypertension, hyperlipidemia, Parkinson's disease, hospitalization for UTI last month presented after a fall and altered  mental status.  On presentation, CT of the head showed subdural hematoma around the left hemisphere.  Patient was admitted to ICU service.  Neurosurgery was consulted and recommended conservative management.  Patient received adnexa.  Repeat CT head was stable.  He was started on Keppra.  He was transferred to [facility] service from 08/16/2022 onwards. Unfortunately, ongoing AMS and reduced alertness. Type of Study: Bedside Swallow Evaluation Previous Swallow Assessment: prior hospitalization in 11/23, which was grossly normal, recommending thin and regular Diet Prior to this Study: Regular;Thin liquids Temperature Spikes Noted: Yes Respiratory Status: Room air History of Recent Intubation: No Behavior/Cognition: Lethargic/Drowsy;Requires cueing Oral Cavity Assessment: Within Functional Limits Oral Care Completed by SLP: No Oral Cavity - Dentition: Adequate natural dentition Self-Feeding Abilities: Total assist Patient Positioning: Upright in bed Baseline Vocal Quality: Low vocal intensity Volitional Cough: Weak Volitional Swallow: Unable to elicit    Oral/Motor/Sensory Function Overall Oral Motor/Sensory Function: Generalized oral weakness   Ice Chips Ice chips: Impaired Presentation: Spoon Oral Phase Impairments: Reduced labial seal   Thin Liquid Thin Liquid: Impaired Presentation: Cup Pharyngeal  Phase Impairments: Cough - Immediate;Change in Vital Signs    Puree Puree: Within functional limits Presentation: Spoon   Solid     Solid: Impaired Oral Phase Functional Implications: Prolonged oral transit;Oral residue     Montee Tallman P. Nivan Melendrez, M.S., CCC-SLP Speech-Language Pathologist Acute Rehabilitation  Services Pager: [place] 08/16/2022,1:44 PM

## 2022-08-17 DIAGNOSIS — M2570 Osteophyte, unspecified joint: Secondary | ICD-10-CM

## 2022-08-17 DIAGNOSIS — Z515 Encounter for palliative care: Secondary | ICD-10-CM

## 2022-08-17 DIAGNOSIS — S065XAA Traumatic subdural hemorrhage with loss of consciousness status unknown, initial encounter: Secondary | ICD-10-CM | POA: Diagnosis not present

## 2022-08-17 LAB — COMPREHENSIVE METABOLIC PANEL
ALT: 19 U/L (ref 0–44)
AST: 19 U/L (ref 15–41)
Albumin: 2.9 g/dL — ABNORMAL LOW (ref 3.5–5.0)
Alkaline Phosphatase: 73 U/L (ref 38–126)
Anion gap: 7 (ref 5–15)
BUN: 20 mg/dL (ref 8–23)
CO2: 23 mmol/L (ref 22–32)
Calcium: 9.2 mg/dL (ref 8.9–10.3)
Chloride: 105 mmol/L (ref 98–111)
Creatinine, Ser: 0.95 mg/dL (ref 0.61–1.24)
GFR, Estimated: >60 mL/min (ref >60)
Glucose, Bld: 114 mg/dL — ABNORMAL HIGH (ref 70–99)
Potassium: 3.4 mmol/L — ABNORMAL LOW (ref 3.5–5.1)
Sodium: 135 mmol/L (ref 135–145)
Total Bilirubin: 0.8 mg/dL (ref 0.3–1.2)
Total Protein: 5.5 g/dL — ABNORMAL LOW (ref 6.5–8.1)

## 2022-08-17 LAB — GLUCOSE, CAPILLARY
Glucose-Capillary: 145 mg/dL — ABNORMAL HIGH (ref 70–99)
Glucose-Capillary: 101 mg/dL — ABNORMAL HIGH (ref 70–99)

## 2022-08-17 LAB — CBC WITH DIFFERENTIAL/PLATELET
Abs Immature Granulocytes: 0.05 10*3/uL (ref 0.00–0.07)
Basophils Absolute: 0 10*3/uL (ref 0.0–0.1)
Basophils Relative: 0 %
Eosinophils Absolute: 0 10*3/uL (ref 0.0–0.5)
Eosinophils Relative: 1 %
HCT: 32.6 % — ABNORMAL LOW (ref 39.0–52.0)
Hemoglobin: 9.8 g/dL — ABNORMAL LOW (ref 13.0–17.0)
Immature Granulocytes: 1 %
Lymphocytes Relative: 18 %
Lymphs Abs: 1.5 10*3/uL (ref 0.7–4.0)
MCH: 24.4 pg — ABNORMAL LOW (ref 26.0–34.0)
MCHC: 30.1 g/dL (ref 30.0–36.0)
MCV: 81.1 fL (ref 80.0–100.0)
Monocytes Absolute: 1.2 10*3/uL — ABNORMAL HIGH (ref 0.1–1.0)
Monocytes Relative: 15 %
Neutro Abs: 5.4 10*3/uL (ref 1.7–7.7)
Neutrophils Relative %: 65 %
Platelets: 200 10*3/uL (ref 150–400)
RBC: 4.02 MIL/uL — ABNORMAL LOW (ref 4.22–5.81)
RDW: 16.5 % — ABNORMAL HIGH (ref 11.5–15.5)
WBC: 8.1 10*3/uL (ref 4.0–10.5)
nRBC: 0 % (ref 0.0–0.2)

## 2022-08-17 LAB — MAGNESIUM: Magnesium: 1.8 mg/dL (ref 1.7–2.4)

## 2022-08-17 MED ORDER — SODIUM CHLORIDE 0.9 % IV SOLN
500.0000 mg | INTRAVENOUS | Status: DC
  Administered 2022-08-17 – 2022-08-21 (×5): 500 mg via INTRAVENOUS
  Filled 2022-08-17 (×8): qty 5

## 2022-08-17 NOTE — Progress Notes (Signed)
     Referral received for Adam Kane re: goals of care discussion. Chart reviewed. Pt unable to engage in complex decision making independently.   I was able to speak with patient's daughter Adam Kane over the phone. [place] meeting scheduled for 12/18 @ 2pm. Family is aware that we will meet at patient's bedside.   Detailed note and recommendations to follow once GOC has been completed.   Thank you for your referral and allowing PMT to assist in Adam Kane's care.   Jordan Hawks, FNP-BC Palliative Medicine Team  Phone: [phone]  NO CHARGE

## 2022-08-17 NOTE — Progress Notes (Signed)
PROGRESS NOTE    Adam Kane  YQM:[id number] DOB: 1933/08/18 DOA: 08/14/2022 PCP: Crist Infante, MD   Brief Narrative:  88 year old male with history of atrial fibrillation on Eliquis, CAD status post CABG, hypertension, hyperlipidemia, Parkinson's disease, hospitalization for UTI last month presented after a fall and altered mental status.  On presentation, CT of the head showed subdural hematoma around the left hemisphere.  Patient was admitted to ICU service.  Neurosurgery was consulted and recommended conservative management.  Patient received adnexa.  Repeat CT head was stable.  He was started on Keppra.  He was transferred to [facility] service from 08/16/2022 onwards.  Assessment & Plan:   Subdural hematoma after a fall -Was on Eliquis prior to presentation: Received adnexxa -Repeat CT head stable.  Neurosurgery evaluation appreciated: Recommended conservative management with follow-up CT head without contrast in 2 weeks.  He has been started on Keppra twice daily for 7 days.  Continue to hold Eliquis till reevaluation by neurosurgery as an outpatient -Will hold off on chemical DVT prophylaxis because of persistent encephalopathy  Acute metabolic encephalopathy -Possibly from above.  Still drowsy this morning. -Repeat CT of the head done yesterday afternoon showed stable subdural hematoma.  If mental status does not improve, might need MRI of brain. -Keppra can also lead to drowsiness. -Monitor mental status.  PT/OT eval. -SLP currently recommending n.p.o. status  Chronic atrial fibrillation -Continue digoxin IV for now.  Continue Cardizem if able to swallow orally.  Eliquis plan as above.  Rate controlled currently.  Fever Possible UTI -Continue Rocephin.  Tmax of 100.6 in the last 24 hours.  Chest x-ray negative on 08/16/2022 showed left lung base patchy opacity due to atelectasis or pneumonia.  Add Zithromax.  Check procalcitonin and -Follow cultures  Hyperlipidemia -Continue  statin if able to swallow  Hypertension -monitor blood pressure.  Intermittently on the lower side.  Parkinson's disease -Resume carbidopa levodopa if able to swallow clinically  Anemia of chronic disease -from chronic illnesses.  Goals of care -Palliative care consultation for goals of care discussion.   DVT prophylaxis: SCDs Code Status: DNR Family Communication: Daughter on phone on 08/16/2022 Disposition Plan: Status is: Inpatient Remains inpatient appropriate because: Of severity of illness   Consultants: PCCM/neurosurgery.  Palliative care  Procedures: None  Antimicrobials: Rocephin from 08/16/2022   Subjective: Patient seen and examined at bedside.  Poor historian.  Wakes up very slightly, hardly answers any questions.  No seizures, vomiting, agitation reported. Objective: Vitals:   08/16/22 2313 08/17/22 0314 08/17/22 0500 08/17/22 0720  BP: 119/78 95/70  107/76  Pulse: 94 78    Resp: '20 13 17   '$ Temp: 99.9 F (37.7 C) 98.8 F (37.1 C)  98.1 F (36.7 C)  TempSrc: Axillary Axillary  Oral  SpO2: 90% 95%      Intake/Output Summary (Last 24 hours) at 08/17/2022 0751 Last data filed at 08/17/2022 0500 Gross per 24 hour  Intake 1541.09 ml  Output 1150 ml  Net 391.09 ml    There were no vitals filed for this visit.  Examination:  General: On room air.  No distress.  Looks chronically ill and deconditioned. ENT/neck: No thyromegaly.  JVD is not elevated  respiratory: Decreased breath sounds at bases bilaterally with some crackles; no wheezing  CVS: S1-S2 heard, rate controlled currently Abdominal: Soft, nontender, slightly distended; no organomegaly, bowel sounds are heard Extremities: Trace lower extremity edema; no cyanosis  CNS: Wakes up very slightly, hardly answers any questions.  No focal neurologic  deficit.  Moves extremities Lymph: No obvious lymphadenopathy Skin: No obvious ecchymosis/lesions  psych: Extremely flat affect.  Not agitated.    Musculoskeletal: No obvious joint swelling/deformity     Data Reviewed: I have personally reviewed following labs and imaging studies  CBC: Recent Labs  Lab 08/14/22 1900 08/15/22 0646 08/16/22 0321 08/17/22 0325  WBC 9.0 6.4 8.6 8.1  NEUTROABS  --   --   --  5.4  HGB 10.3* 10.4* 10.9* 9.8*  HCT 34.2* 33.6* 34.8* 32.6*  MCV 82.6 80.6 79.5* 81.1  PLT 214 204 215 703    Basic Metabolic Panel: Recent Labs  Lab 08/14/22 1900 08/15/22 0646 08/16/22 0321 08/17/22 0325  NA 138 138 136 135  K 3.7 3.5 3.7 3.4*  CL 109 107 104 105  CO2 '23 22 22 23  '$ GLUCOSE 196* 115* 141* 114*  BUN 30* '20 21 20  '$ CREATININE 1.24 1.09 1.01 0.95  CALCIUM 9.7 9.4 9.5 9.2  MG  --   --  1.9 1.8    GFR: CrCl cannot be calculated (Unknown ideal weight.). Liver Function Tests: Recent Labs  Lab 08/14/22 1900 08/17/22 0325  AST 17 19  ALT 7 19  ALKPHOS 86 73  BILITOT 0.6 0.8  PROT 6.5 5.5*  ALBUMIN 3.7 2.9*    No results for input(s): "LIPASE", "AMYLASE" in the last 168 hours. No results for input(s): "AMMONIA" in the last 168 hours. Coagulation Profile: Recent Labs  Lab 08/14/22 1900  INR 1.3*    Cardiac Enzymes: No results for input(s): "CKTOTAL", "CKMB", "CKMBINDEX", "TROPONINI" in the last 168 hours. BNP (last 3 results) No results for input(s): "PROBNP" in the last 8760 hours. HbA1C: No results for input(s): "HGBA1C" in the last 72 hours. CBG: Recent Labs  Lab 08/14/22 1826  GLUCAP 166*    Lipid Profile: No results for input(s): "CHOL", "HDL", "LDLCALC", "TRIG", "CHOLHDL", "LDLDIRECT" in the last 72 hours. Thyroid Function Tests: No results for input(s): "TSH", "T4TOTAL", "FREET4", "T3FREE", "THYROIDAB" in the last 72 hours. Anemia Panel: No results for input(s): "VITAMINB12", "FOLATE", "FERRITIN", "TIBC", "IRON", "RETICCTPCT" in the last 72 hours. Sepsis Labs: Recent Labs  Lab 08/14/22 1900  LATICACIDVEN 2.0*     Recent Results (from the past 240 hour(s))   Resp panel by RT-PCR (RSV, Flu A&B, Covid) Anterior Nasal Swab     Status: None   Collection Time: 08/14/22  8:13 PM   Specimen: Anterior Nasal Swab  Result Value Ref Range Status   SARS Coronavirus 2 by RT PCR NEGATIVE NEGATIVE Final    Comment: (NOTE) SARS-CoV-2 target nucleic acids are NOT DETECTED.  The SARS-CoV-2 RNA is generally detectable in upper respiratory specimens during the acute phase of infection. The lowest concentration of SARS-CoV-2 viral copies this assay can detect is 138 copies/mL. A negative result does not preclude SARS-Cov-2 infection and should not be used as the sole basis for treatment or other patient management decisions. A negative result may occur with  improper specimen collection/handling, submission of specimen other than nasopharyngeal swab, presence of viral mutation(s) within the areas targeted by this assay, and inadequate number of viral copies(<138 copies/mL). A negative result must be combined with clinical observations, patient history, and epidemiological information. The expected result is Negative.  Fact Sheet for Patients:  EntrepreneurPulse.com.au  Fact Sheet for Healthcare Providers:  IncredibleEmployment.be  This test is no t yet approved or cleared by the [place] FDA and  has been authorized for detection and/or diagnosis of SARS-CoV-2 by FDA under  an Emergency Use Authorization (EUA). This EUA will remain  in effect (meaning this test can be used) for the duration of the COVID-19 declaration under Section 564(b)(1) of the Act, 21 U.S.C.section 360bbb-3(b)(1), unless the authorization is terminated  or revoked sooner.       Influenza A by PCR NEGATIVE NEGATIVE Final   Influenza B by PCR NEGATIVE NEGATIVE Final    Comment: (NOTE) The Xpert Xpress SARS-CoV-2/FLU/RSV plus assay is intended as an aid in the diagnosis of influenza from Nasopharyngeal swab specimens and should not be used  as a sole basis for treatment. Nasal washings and aspirates are unacceptable for Xpert Xpress SARS-CoV-2/FLU/RSV testing.  Fact Sheet for Patients: EntrepreneurPulse.com.au  Fact Sheet for Healthcare Providers: IncredibleEmployment.be  This test is not yet approved or cleared by the [place] FDA and has been authorized for detection and/or diagnosis of SARS-CoV-2 by FDA under an Emergency Use Authorization (EUA). This EUA will remain in effect (meaning this test can be used) for the duration of the COVID-19 declaration under Section 564(b)(1) of the Act, 21 U.S.C. section 360bbb-3(b)(1), unless the authorization is terminated or revoked.     Resp Syncytial Virus by PCR NEGATIVE NEGATIVE Final    Comment: (NOTE) Fact Sheet for Patients: EntrepreneurPulse.com.au  Fact Sheet for Healthcare Providers: IncredibleEmployment.be  This test is not yet approved or cleared by the [place] FDA and has been authorized for detection and/or diagnosis of SARS-CoV-2 by FDA under an Emergency Use Authorization (EUA). This EUA will remain in effect (meaning this test can be used) for the duration of the COVID-19 declaration under Section 564(b)(1) of the Act, 21 U.S.C. section 360bbb-3(b)(1), unless the authorization is terminated or revoked.  Performed at [facility], [place] [address], [place], [place] [address]          Radiology Studies: DG CHEST PORT 1 VIEW  Result Date: 08/16/2022 CLINICAL DATA:  Dyspnea EXAM: PORTABLE CHEST 1 VIEW COMPARISON:  08/14/2022 FINDINGS: Post sternotomy changes. Cardiomegaly. Mild patchy opacity at the left lung base. Aortic atherosclerosis. No pleural effusion or pneumothorax. IMPRESSION: Mild patchy opacity at the left lung base may be due to atelectasis or pneumonia. Cardiomegaly. Electronically Signed   By: Donavan Foil M.D.   On: 08/16/2022 17:38   CT  Head Wo Contrast  Result Date: 08/15/2022 CLINICAL DATA:  Mental status change, unknown cause. Syncope. Subdural hematoma. EXAM: CT HEAD WITHOUT CONTRAST TECHNIQUE: Contiguous axial images were obtained from the base of the skull through the vertex without intravenous contrast. RADIATION DOSE REDUCTION: This exam was performed according to the departmental dose-optimization program which includes automated exposure control, adjustment of the mA and/or kV according to patient size and/or use of iterative reconstruction technique. COMPARISON:  Head CT earlier today FINDINGS: Brain: Subdural hematoma over the left lateral cerebral convexity, falx, and left tentorium measures up to 6 mm in thickness, unchanged. Subdural hematoma component inferior to the left temporal lobe measures up to 17 mm in thickness, also unchanged. 3 mm of rightward midline shift is unchanged. There is the suggestion of increasing hypoattenuation within cortex and white matter of the anterior left temporal lobe, however assessment is limited by motion and skull base streak artifact. No new intracranial hemorrhage is identified. There is mild cerebral atrophy. Hypodensities elsewhere in the cerebral white matter bilaterally are nonspecific but compatible with mild chronic small vessel ischemic disease. Vascular: No hyperdense vessel. Skull: No acute fracture or suspicious osseous lesion. Sinuses/Orbits: Chronic right sphenoid sinusitis. No significant mastoid  fluid. Bilateral cataract extraction. Other: None. IMPRESSION: 1. Unchanged left-sided subdural hematoma and 3 mm of rightward midline shift. 2. Increased hypodensity in the anterior left temporal lobe, indeterminate for worsening edema (such as from infarct or infection) versus artifact. Consider brain MRI for further evaluation. Electronically Signed   By: Logan Bores M.D.   On: 08/15/2022 16:35        Scheduled Meds:  atorvastatin  40 mg Oral Daily   Carbidopa-Levodopa ER  2  tablet Oral TID   digoxin  0.125 mg Intravenous Daily   diltiazem  60 mg Oral Q8H   Continuous Infusions:  sodium chloride 75 mL/hr at 08/17/22 0053   sodium chloride     cefTRIAXone (ROCEPHIN)  IV 200 mL/hr at 08/17/22 0053   levETIRAcetam 400 mL/hr at 08/17/22 2376          Aline August, MD [facility] 08/17/2022, 7:51 AM

## 2022-08-17 NOTE — Progress Notes (Signed)
Speech Language Pathology Treatment: Dysphagia  Patient Details Name: Adam Kane MRN: [id number] DOB: 19-Jul-1933 Today's Date: 08/17/2022 Time: 1561-5379 SLP Time Calculation (min) (ACUTE ONLY): 9 min  Assessment / Plan / Recommendation Clinical Impression  Pt seen for ongoing dysphagia management. Pt much more alert today as compared to evaluation 12/16.  Pt tolerated all consistencies trialed with no clinical s/s of aspiration.  Daughter reports some coughing with PO intake at home, but consumes regular diet at baseline.  Pt has not had recent hx of pneumonia. Voluntary cough strength adequate. CXR 12/16 with hazy opacity, ?atelectasis v pneumonia.  If there is specific concern for silent aspiration, consider MBSS. SLP to follow clinically for diet tolerance v need for instrumental assessment.  Recommend regular texture diet with thin liquid.     HPI HPI: 88 year old male with history of atrial fibrillation on Eliquis, CAD status post CABG, hypertension, hyperlipidemia, Parkinson's disease, hospitalization for UTI last month presented after a fall and altered mental status.  On presentation, CT of the head showed subdural hematoma around the left hemisphere.  Patient was admitted to ICU service.  Neurosurgery was consulted and recommended conservative management.  Patient received adnexa.  Repeat CT head was stable.  He was started on Keppra.  He was transferred to [facility] service from 08/16/2022 onwards. Unfortunately, ongoing AMS and reduced alertness.      SLP Plan  Continue with current plan of care      Recommendations for follow up therapy are one component of a multi-disciplinary discharge planning process, led by the attending physician.  Recommendations may be updated based on patient status, additional functional criteria and insurance authorization.    Recommendations  Diet recommendations: Regular;Thin liquid Liquids provided via: Straw;Cup Medication Administration:  (As  tolerated; no specific precautions) Supervision: Staff to assist with self feeding Compensations: Slow rate;Small sips/bites Postural Changes and/or Swallow Maneuvers: Seated upright 90 degrees                Oral Care Recommendations: Oral care BID Follow Up Recommendations: No SLP follow up Assistance recommended at discharge: None SLP Visit Diagnosis: Dysphagia, oropharyngeal phase (R13.12) Plan: Continue with current plan of care           Celedonio Savage, [place], [place] Office: [phone] 08/17/2022, 11:37 AM

## 2022-08-18 DIAGNOSIS — S065XAA Traumatic subdural hemorrhage with loss of consciousness status unknown, initial encounter: Secondary | ICD-10-CM | POA: Diagnosis not present

## 2022-08-18 LAB — GLUCOSE, CAPILLARY
Glucose-Capillary: 98 mg/dL (ref 70–99)
Glucose-Capillary: 104 mg/dL — ABNORMAL HIGH (ref 70–99)
Glucose-Capillary: 100 mg/dL — ABNORMAL HIGH (ref 70–99)
Glucose-Capillary: 130 mg/dL — ABNORMAL HIGH (ref 70–99)
Glucose-Capillary: 163 mg/dL — ABNORMAL HIGH (ref 70–99)
Glucose-Capillary: 121 mg/dL — ABNORMAL HIGH (ref 70–99)

## 2022-08-18 LAB — PROCALCITONIN: Procalcitonin: <0.1 ng/mL

## 2022-08-18 MED ORDER — MELATONIN 3 MG PO TABS
3.0000 mg | ORAL_TABLET | Freq: Every day | ORAL | Status: DC
  Administered 2022-08-18 – 2022-08-26 (×9): 3 mg via ORAL
  Filled 2022-08-18 (×9): qty 1

## 2022-08-18 MED ORDER — ACETAMINOPHEN 325 MG PO TABS
650.0000 mg | ORAL_TABLET | Freq: Four times a day (QID) | ORAL | Status: DC | PRN
  Administered 2022-08-18 – 2022-08-27 (×9): 650 mg via ORAL
  Filled 2022-08-18 (×9): qty 2

## 2022-08-18 MED ORDER — DIGOXIN 125 MCG PO TABS
0.1250 mg | ORAL_TABLET | Freq: Every day | ORAL | Status: DC
  Administered 2022-08-19 – 2022-08-26 (×8): 0.125 mg via ORAL
  Filled 2022-08-18 (×9): qty 1

## 2022-08-18 NOTE — Care Management Important Message (Signed)
Important Message  Patient Details  Name: Adam Kane MRN: [id number] Date of Birth: 1933-03-31   Medicare Important Message Given:  Yes     Orbie Pyo 08/18/2022, 3:37 PM

## 2022-08-18 NOTE — Progress Notes (Signed)
PROGRESS NOTE    Adam Kane  DPO:[id number] DOB: October 04, 1932 DOA: 08/14/2022 PCP: Crist Infante, MD   Brief Narrative:  88 year old male with history of atrial fibrillation on Eliquis, CAD status post CABG, hypertension, hyperlipidemia, Parkinson's disease, hospitalization for UTI last month presented after a fall and altered mental status.  On presentation, CT of the head showed subdural hematoma around the left hemisphere.  Patient was admitted to ICU service.  Neurosurgery was consulted and recommended conservative management.  Patient received adnexa.  Repeat CT head was stable.  He was started on Keppra.  He was transferred to [facility] service from 08/16/2022 onwards.  Assessment & Plan:   Subdural hematoma after a fall -Was on Eliquis prior to presentation: Received adnexxa -Repeat CT head stable.  Neurosurgery evaluation appreciated: Recommended conservative management with follow-up CT head without contrast in 2 weeks.  He has been started on Keppra twice daily for 7 days.  Continue to hold Eliquis till reevaluation by neurosurgery as an outpatient  Acute metabolic encephalopathy -Possibly from above.   -Repeat CT of the head done yesterday afternoon showed stable subdural hematoma.   -More awake this morning -Monitor mental status.  PT/OT eval. -Patient has been started on diet as per SLP recommendations.  Chronic atrial fibrillation -Switch to oral digoxin.  Continue Cardizem.  Eliquis plan as above.  Rate controlled currently.  Fever Possible UTI Possible left lower lobe pneumonia -Continue Rocephin and Zithromax.  No temperature spikes over the last 24 hours.  Chest x-ray negative on 08/16/2022 showed left lung base patchy opacity due to atelectasis or pneumonia.  Procalcitonin less than 0.1 -Cultures negative so far  Hyperlipidemia -Continue statin  Hypertension -monitor blood pressure.  Intermittently on the lower side.  Parkinson's disease -Continue carbidopa  levodopa   Anemia of chronic disease -from chronic illnesses.  Goals of care -Palliative care consultation for goals of care discussion.  Hypokalemia -Labs pending for today   DVT prophylaxis: SCDs Code Status: DNR Family Communication: Son-in-law at bedside Disposition Plan: Status is: Inpatient Remains inpatient appropriate because: Of severity of illness   Consultants: PCCM/neurosurgery.  Palliative care  Procedures: None  Antimicrobials:  Anti-infectives (From admission, onward)    Start     Dose/Rate Route Frequency Ordered Stop   08/17/22 1115  azithromycin (ZITHROMAX) 500 mg in sodium chloride 0.9 % 250 mL IVPB        500 mg 250 mL/hr over 60 Minutes Intravenous Every 24 hours 08/17/22 1018     08/16/22 1230  cefTRIAXone (ROCEPHIN) 2 g in sodium chloride 0.9 % 100 mL IVPB        2 g 200 mL/hr over 30 Minutes Intravenous Daily 08/16/22 1146          Subjective: Patient seen and examined at bedside.  Poor historian.  More awake this morning but confused.  No agitation, fever, seizures or vomiting reported.  Son-in-law at bedside and stated that he ate his breakfast today.  objective: Vitals:   08/18/22 0310 08/18/22 0500 08/18/22 0512 08/18/22 0800  BP: 98/63  122/69 121/83  Pulse: 91  85 95  Resp: 20  (!) 21 13  Temp: 98.3 F (36.8 C)   98.1 F (36.7 C)  TempSrc: Oral   Oral  SpO2: 96%  95% 98%  Weight:  100.2 kg      Intake/Output Summary (Last 24 hours) at 08/18/2022 0834 Last data filed at 08/18/2022 0306 Gross per 24 hour  Intake 2109.67 ml  Output 1050 ml  Net 1059.67 ml    Filed Weights   08/18/22 0500  Weight: 100.2 kg    Examination:  General: No acute distress.  Currently on room air.  Looks chronically ill and deconditioned. ENT/neck: No palpable neck masses or JVD elevation noted  respiratory: Bilateral decreased breath sounds at bases with some scattered crackles, intermittent tachypnea present CVS: Mostly rate controlled; S1  and S2 are heard  abdominal: Soft, nontender, still slightly distended; no organomegaly, normal bowel sounds heard  extremities: No clubbing; mild lower extremity edema present CNS: Awake, answers some questions but confused to time.  Still slow to respond.  No focal neurologic deficit.  Moves extremities  lymph: No cervical lymphadenopathy noted Skin: No obvious rashes/petechiae psych: Currently not agitated.  Affect is extremely flat  musculoskeletal: No obvious joint erythema/swelling/deformity     Data Reviewed: I have personally reviewed following labs and imaging studies  CBC: Recent Labs  Lab 08/14/22 1900 08/15/22 0646 08/16/22 0321 08/17/22 0325  WBC 9.0 6.4 8.6 8.1  NEUTROABS  --   --   --  5.4  HGB 10.3* 10.4* 10.9* 9.8*  HCT 34.2* 33.6* 34.8* 32.6*  MCV 82.6 80.6 79.5* 81.1  PLT 214 204 215 672    Basic Metabolic Panel: Recent Labs  Lab 08/14/22 1900 08/15/22 0646 08/16/22 0321 08/17/22 0325  NA 138 138 136 135  K 3.7 3.5 3.7 3.4*  CL 109 107 104 105  CO2 '23 22 22 23  '$ GLUCOSE 196* 115* 141* 114*  BUN 30* '20 21 20  '$ CREATININE 1.24 1.09 1.01 0.95  CALCIUM 9.7 9.4 9.5 9.2  MG  --   --  1.9 1.8    GFR: Estimated Creatinine Clearance: 67.3 mL/min (by C-G formula based on SCr of 0.95 mg/dL). Liver Function Tests: Recent Labs  Lab 08/14/22 1900 08/17/22 0325  AST 17 19  ALT 7 19  ALKPHOS 86 73  BILITOT 0.6 0.8  PROT 6.5 5.5*  ALBUMIN 3.7 2.9*    No results for input(s): "LIPASE", "AMYLASE" in the last 168 hours. No results for input(s): "AMMONIA" in the last 168 hours. Coagulation Profile: Recent Labs  Lab 08/14/22 1900  INR 1.3*    Cardiac Enzymes: No results for input(s): "CKTOTAL", "CKMB", "CKMBINDEX", "TROPONINI" in the last 168 hours. BNP (last 3 results) No results for input(s): "PROBNP" in the last 8760 hours. HbA1C: No results for input(s): "HGBA1C" in the last 72 hours. CBG: Recent Labs  Lab 08/14/22 1826 08/17/22 2025  08/17/22 2255 08/18/22 0314 08/18/22 0742  GLUCAP 166* 145* 101* 98 104*    Lipid Profile: No results for input(s): "CHOL", "HDL", "LDLCALC", "TRIG", "CHOLHDL", "LDLDIRECT" in the last 72 hours. Thyroid Function Tests: No results for input(s): "TSH", "T4TOTAL", "FREET4", "T3FREE", "THYROIDAB" in the last 72 hours. Anemia Panel: No results for input(s): "VITAMINB12", "FOLATE", "FERRITIN", "TIBC", "IRON", "RETICCTPCT" in the last 72 hours. Sepsis Labs: Recent Labs  Lab 08/14/22 1900 08/18/22 0455  PROCALCITON  --  <0.10  LATICACIDVEN 2.0*  --      Recent Results (from the past 240 hour(s))  Resp panel by RT-PCR (RSV, Flu A&B, Covid) Anterior Nasal Swab     Status: None   Collection Time: 08/14/22  8:13 PM   Specimen: Anterior Nasal Swab  Result Value Ref Range Status   SARS Coronavirus 2 by RT PCR NEGATIVE NEGATIVE Final    Comment: (NOTE) SARS-CoV-2 target nucleic acids are NOT DETECTED.  The SARS-CoV-2 RNA is generally detectable in upper respiratory specimens during  the acute phase of infection. The lowest concentration of SARS-CoV-2 viral copies this assay can detect is 138 copies/mL. A negative result does not preclude SARS-Cov-2 infection and should not be used as the sole basis for treatment or other patient management decisions. A negative result may occur with  improper specimen collection/handling, submission of specimen other than nasopharyngeal swab, presence of viral mutation(s) within the areas targeted by this assay, and inadequate number of viral copies(<138 copies/mL). A negative result must be combined with clinical observations, patient history, and epidemiological information. The expected result is Negative.  Fact Sheet for Patients:  EntrepreneurPulse.com.au  Fact Sheet for Healthcare Providers:  IncredibleEmployment.be  This test is no t yet approved or cleared by the [place] FDA and  has been authorized  for detection and/or diagnosis of SARS-CoV-2 by FDA under an Emergency Use Authorization (EUA). This EUA will remain  in effect (meaning this test can be used) for the duration of the COVID-19 declaration under Section 564(b)(1) of the Act, 21 U.S.C.section 360bbb-3(b)(1), unless the authorization is terminated  or revoked sooner.       Influenza A by PCR NEGATIVE NEGATIVE Final   Influenza B by PCR NEGATIVE NEGATIVE Final    Comment: (NOTE) The Xpert Xpress SARS-CoV-2/FLU/RSV plus assay is intended as an aid in the diagnosis of influenza from Nasopharyngeal swab specimens and should not be used as a sole basis for treatment. Nasal washings and aspirates are unacceptable for Xpert Xpress SARS-CoV-2/FLU/RSV testing.  Fact Sheet for Patients: EntrepreneurPulse.com.au  Fact Sheet for Healthcare Providers: IncredibleEmployment.be  This test is not yet approved or cleared by the [place] FDA and has been authorized for detection and/or diagnosis of SARS-CoV-2 by FDA under an Emergency Use Authorization (EUA). This EUA will remain in effect (meaning this test can be used) for the duration of the COVID-19 declaration under Section 564(b)(1) of the Act, 21 U.S.C. section 360bbb-3(b)(1), unless the authorization is terminated or revoked.     Resp Syncytial Virus by PCR NEGATIVE NEGATIVE Final    Comment: (NOTE) Fact Sheet for Patients: EntrepreneurPulse.com.au  Fact Sheet for Healthcare Providers: IncredibleEmployment.be  This test is not yet approved or cleared by the [place] FDA and has been authorized for detection and/or diagnosis of SARS-CoV-2 by FDA under an Emergency Use Authorization (EUA). This EUA will remain in effect (meaning this test can be used) for the duration of the COVID-19 declaration under Section 564(b)(1) of the Act, 21 U.S.C. section 360bbb-3(b)(1), unless the authorization is  terminated or revoked.  Performed at [facility], [place] [address], [place], [place] [address]   Culture, blood (Routine X 2) w Reflex to ID Panel     Status: None (Preliminary result)   Collection Time: 08/16/22  1:16 PM   Specimen: BLOOD  Result Value Ref Range Status   Specimen Description BLOOD SITE NOT SPECIFIED  Final   Special Requests   Final    BOTTLES DRAWN AEROBIC AND ANAEROBIC Blood Culture results may not be optimal due to an inadequate volume of blood received in culture bottles   Culture   Final    NO GROWTH 2 DAYS Performed at [facility], [place] [address] [place], [place] [address]    Report Status PENDING  Incomplete  Culture, blood (Routine X 2) w Reflex to ID Panel     Status: None (Preliminary result)   Collection Time: 08/16/22  1:22 PM   Specimen: BLOOD  Result Value Ref Range Status  Specimen Description BLOOD SITE NOT SPECIFIED  Final   Special Requests   Final    BOTTLES DRAWN AEROBIC AND ANAEROBIC Blood Culture results may not be optimal due to an inadequate volume of blood received in culture bottles   Culture   Final    NO GROWTH 2 DAYS Performed at [facility], [place] [address], [place], [place] [address]    Report Status PENDING  Incomplete         Radiology Studies: DG CHEST PORT 1 VIEW  Result Date: 08/16/2022 CLINICAL DATA:  Dyspnea EXAM: PORTABLE CHEST 1 VIEW COMPARISON:  08/14/2022 FINDINGS: Post sternotomy changes. Cardiomegaly. Mild patchy opacity at the left lung base. Aortic atherosclerosis. No pleural effusion or pneumothorax. IMPRESSION: Mild patchy opacity at the left lung base may be due to atelectasis or pneumonia. Cardiomegaly. Electronically Signed   By: Donavan Foil M.D.   On: 08/16/2022 17:38        Scheduled Meds:  atorvastatin  40 mg Oral Daily   Carbidopa-Levodopa ER  2 tablet Oral TID   digoxin  0.125 mg Intravenous Daily   diltiazem  60 mg Oral Q8H   Continuous Infusions:  sodium  chloride 75 mL/hr at 08/18/22 0305   sodium chloride     azithromycin Stopped (08/17/22 1320)   cefTRIAXone (ROCEPHIN)  IV Stopped (08/17/22 0933)   levETIRAcetam Stopped (08/17/22 2217)          Aline August, MD [facility] 08/18/2022, 8:34 AM

## 2022-08-19 DIAGNOSIS — W19XXXA Unspecified fall, initial encounter: Secondary | ICD-10-CM | POA: Diagnosis not present

## 2022-08-19 DIAGNOSIS — Z66 Do not resuscitate: Secondary | ICD-10-CM

## 2022-08-19 DIAGNOSIS — Z7901 Long term (current) use of anticoagulants: Secondary | ICD-10-CM | POA: Diagnosis not present

## 2022-08-19 DIAGNOSIS — S065XAA Traumatic subdural hemorrhage with loss of consciousness status unknown, initial encounter: Secondary | ICD-10-CM | POA: Diagnosis not present

## 2022-08-19 DIAGNOSIS — G20A1 Parkinson's disease without dyskinesia, without mention of fluctuations: Secondary | ICD-10-CM

## 2022-08-19 DIAGNOSIS — M2570 Osteophyte, unspecified joint: Secondary | ICD-10-CM | POA: Diagnosis not present

## 2022-08-19 LAB — CBC WITH DIFFERENTIAL/PLATELET
Abs Immature Granulocytes: 0.09 10*3/uL — ABNORMAL HIGH (ref 0.00–0.07)
Basophils Absolute: 0 10*3/uL (ref 0.0–0.1)
Basophils Relative: 0 %
Eosinophils Absolute: 0.1 10*3/uL (ref 0.0–0.5)
Eosinophils Relative: 1 %
HCT: 35.3 % — ABNORMAL LOW (ref 39.0–52.0)
Hemoglobin: 10.8 g/dL — ABNORMAL LOW (ref 13.0–17.0)
Immature Granulocytes: 1 %
Lymphocytes Relative: 19 %
Lymphs Abs: 1.5 10*3/uL (ref 0.7–4.0)
MCH: 24.9 pg — ABNORMAL LOW (ref 26.0–34.0)
MCHC: 30.6 g/dL (ref 30.0–36.0)
MCV: 81.5 fL (ref 80.0–100.0)
Monocytes Absolute: 1.1 10*3/uL — ABNORMAL HIGH (ref 0.1–1.0)
Monocytes Relative: 15 %
Neutro Abs: 4.8 10*3/uL (ref 1.7–7.7)
Neutrophils Relative %: 64 %
Platelets: 221 10*3/uL (ref 150–400)
RBC: 4.33 MIL/uL (ref 4.22–5.81)
RDW: 16.7 % — ABNORMAL HIGH (ref 11.5–15.5)
WBC: 7.6 10*3/uL (ref 4.0–10.5)
nRBC: 0 % (ref 0.0–0.2)

## 2022-08-19 LAB — BASIC METABOLIC PANEL
Anion gap: 11 (ref 5–15)
BUN: 16 mg/dL (ref 8–23)
CO2: 22 mmol/L (ref 22–32)
Calcium: 9.4 mg/dL (ref 8.9–10.3)
Chloride: 104 mmol/L (ref 98–111)
Creatinine, Ser: 0.9 mg/dL (ref 0.61–1.24)
GFR, Estimated: >60 mL/min (ref >60)
Glucose, Bld: 108 mg/dL — ABNORMAL HIGH (ref 70–99)
Potassium: 3.5 mmol/L (ref 3.5–5.1)
Sodium: 137 mmol/L (ref 135–145)

## 2022-08-19 LAB — MAGNESIUM: Magnesium: 1.7 mg/dL (ref 1.7–2.4)

## 2022-08-19 LAB — GLUCOSE, CAPILLARY
Glucose-Capillary: 123 mg/dL — ABNORMAL HIGH (ref 70–99)
Glucose-Capillary: 128 mg/dL — ABNORMAL HIGH (ref 70–99)
Glucose-Capillary: 100 mg/dL — ABNORMAL HIGH (ref 70–99)
Glucose-Capillary: 100 mg/dL — ABNORMAL HIGH (ref 70–99)
Glucose-Capillary: 192 mg/dL — ABNORMAL HIGH (ref 70–99)
Glucose-Capillary: 119 mg/dL — ABNORMAL HIGH (ref 70–99)

## 2022-08-19 LAB — PROCALCITONIN: Procalcitonin: <0.1 ng/mL

## 2022-08-19 NOTE — Progress Notes (Signed)
PROGRESS NOTE    Adam Kane  DXA:[id number] DOB: 02/26/1933 DOA: 08/14/2022 PCP: Crist Infante, MD   Brief Narrative:  88 year old male with history of atrial fibrillation on Eliquis, CAD status post CABG, hypertension, hyperlipidemia, Parkinson's disease, hospitalization for UTI last month presented after a fall and altered mental status.  On presentation, CT of the head showed subdural hematoma around the left hemisphere.  Patient was admitted to ICU service.  Neurosurgery was consulted and recommended conservative management.  Patient received adnexa.  Repeat CT head was stable.  He was started on Keppra.  He was transferred to [facility] service from 08/16/2022 onwards.  Assessment & Plan:   Subdural hematoma after a fall -Was on Eliquis prior to presentation: Received adnexxa -Repeat CT head stable.  Neurosurgery evaluation appreciated: Recommended conservative management with follow-up CT head without contrast in 2 weeks.  He has been started on Keppra twice daily for 7 days.  Continue to hold Eliquis till reevaluation by neurosurgery as an outpatient  Acute metabolic encephalopathy -Possibly from above.   -Repeat CT of the head done yesterday afternoon showed stable subdural hematoma.   -Awake this morning, still slow to respond but answers questions appropriately. -Monitor mental status.  PT/OT eval. -Patient has been started on diet as per SLP recommendations.  Chronic atrial fibrillation -Continue oral digoxin and Cardizem.  Eliquis plan as above.  Rate controlled currently.  Fever Possible UTI Possible left lower lobe pneumonia -Continue Rocephin and Zithromax.  No temperature spikes over the last 48 hours.  Chest x-ray negative on 08/16/2022 showed left lung base patchy opacity due to atelectasis or pneumonia.  Procalcitonin less than 0.1 -Cultures negative so far  Hyperlipidemia -Continue statin  Hypertension -monitor blood pressure.  Blood pressure has much  improved.  Parkinson's disease -Continue carbidopa levodopa   Anemia of chronic disease -from chronic illnesses.  Goals of care -Palliative care consultation for goals of care discussion.  Hypokalemia -Improved   DVT prophylaxis: SCDs Code Status: DNR Family Communication: Son-in-law at bedside on 08/18/2022 Disposition Plan: Status is: Inpatient Remains inpatient appropriate because: Of severity of illness.  PT eval pending.  Might need rehab placement.   Consultants: PCCM/neurosurgery.  Palliative care  Procedures: None  Antimicrobials:  Anti-infectives (From admission, onward)    Start     Dose/Rate Route Frequency Ordered Stop   08/17/22 1115  azithromycin (ZITHROMAX) 500 mg in sodium chloride 0.9 % 250 mL IVPB        500 mg 250 mL/hr over 60 Minutes Intravenous Every 24 hours 08/17/22 1018     08/16/22 1230  cefTRIAXone (ROCEPHIN) 2 g in sodium chloride 0.9 % 100 mL IVPB        2 g 200 mL/hr over 30 Minutes Intravenous Daily 08/16/22 1146          Subjective: Patient seen and examined at bedside.  Poor historian.  Awake, answers some questions.  Slow to respond.  No seizures, vomiting, fever or agitation reported.   Objective: Vitals:   08/18/22 2300 08/19/22 0300 08/19/22 0500 08/19/22 0831  BP: 117/82 136/82    Pulse: 90 (!) 104    Resp: 20 20    Temp: 98.5 F (36.9 C) 98.3 F (36.8 C)  98.5 F (36.9 C)  TempSrc: Oral Oral  Oral  SpO2: 97% 96%    Weight:   101.5 kg     Intake/Output Summary (Last 24 hours) at 08/19/2022 0836 Last data filed at 08/19/2022 0530 Gross per 24 hour  Intake --  Output 1300 ml  Net -1300 ml    Filed Weights   08/18/22 0500 08/19/22 0500  Weight: 100.2 kg 101.5 kg    Examination:  General: On room air currently.  No distress.  Looks chronically ill and deconditioned. ENT/neck: No JVD elevation noted; no palpable thyromegaly noted  respiratory: Decreased breath sounds at bases bilaterally with scattered  crackles  CVS: S1-S2 heard; mild intermittent tachycardia present  abdominal: Soft, nontender, distended; no organomegaly, bowel sounds heard normally  extremities: Trace lower extremity edema present; no cyanosis  DJM:EQAST, answers some questions.  Slow to respond.   No focal neurologic deficit.  Moving extremities lymph: No palpable cervical lymphadenopathy noted Skin: No obvious/lesions  psych: Mostly flat affect.  Not agitated currently.   Musculoskeletal: No obvious joint erythema/swelling/deformity     Data Reviewed: I have personally reviewed following labs and imaging studies  CBC: Recent Labs  Lab 08/14/22 1900 08/15/22 0646 08/16/22 0321 08/17/22 0325 08/19/22 0452  WBC 9.0 6.4 8.6 8.1 7.6  NEUTROABS  --   --   --  5.4 4.8  HGB 10.3* 10.4* 10.9* 9.8* 10.8*  HCT 34.2* 33.6* 34.8* 32.6* 35.3*  MCV 82.6 80.6 79.5* 81.1 81.5  PLT 214 204 215 200 419    Basic Metabolic Panel: Recent Labs  Lab 08/14/22 1900 08/15/22 0646 08/16/22 0321 08/17/22 0325 08/19/22 0452  NA 138 138 136 135 137  K 3.7 3.5 3.7 3.4* 3.5  CL 109 107 104 105 104  CO2 '23 22 22 23 22  '$ GLUCOSE 196* 115* 141* 114* 108*  BUN 30* '20 21 20 16  '$ CREATININE 1.24 1.09 1.01 0.95 0.90  CALCIUM 9.7 9.4 9.5 9.2 9.4  MG  --   --  1.9 1.8 1.7    GFR: Estimated Creatinine Clearance: 71 mL/min (by C-G formula based on SCr of 0.9 mg/dL). Liver Function Tests: Recent Labs  Lab 08/14/22 1900 08/17/22 0325  AST 17 19  ALT 7 19  ALKPHOS 86 73  BILITOT 0.6 0.8  PROT 6.5 5.5*  ALBUMIN 3.7 2.9*    No results for input(s): "LIPASE", "AMYLASE" in the last 168 hours. No results for input(s): "AMMONIA" in the last 168 hours. Coagulation Profile: Recent Labs  Lab 08/14/22 1900  INR 1.3*    Cardiac Enzymes: No results for input(s): "CKTOTAL", "CKMB", "CKMBINDEX", "TROPONINI" in the last 168 hours. BNP (last 3 results) No results for input(s): "PROBNP" in the last 8760 hours. HbA1C: No results  for input(s): "HGBA1C" in the last 72 hours. CBG: Recent Labs  Lab 08/18/22 1118 08/18/22 1554 08/18/22 1922 08/18/22 2301 08/19/22 0308  GLUCAP 100* 130* 163* 121* 123*    Lipid Profile: No results for input(s): "CHOL", "HDL", "LDLCALC", "TRIG", "CHOLHDL", "LDLDIRECT" in the last 72 hours. Thyroid Function Tests: No results for input(s): "TSH", "T4TOTAL", "FREET4", "T3FREE", "THYROIDAB" in the last 72 hours. Anemia Panel: No results for input(s): "VITAMINB12", "FOLATE", "FERRITIN", "TIBC", "IRON", "RETICCTPCT" in the last 72 hours. Sepsis Labs: Recent Labs  Lab 08/14/22 1900 08/18/22 0455  PROCALCITON  --  <0.10  LATICACIDVEN 2.0*  --      Recent Results (from the past 240 hour(s))  Resp panel by RT-PCR (RSV, Flu A&B, Covid) Anterior Nasal Swab     Status: None   Collection Time: 08/14/22  8:13 PM   Specimen: Anterior Nasal Swab  Result Value Ref Range Status   SARS Coronavirus 2 by RT PCR NEGATIVE NEGATIVE Final    Comment: (NOTE) SARS-CoV-2 target nucleic acids  are NOT DETECTED.  The SARS-CoV-2 RNA is generally detectable in upper respiratory specimens during the acute phase of infection. The lowest concentration of SARS-CoV-2 viral copies this assay can detect is 138 copies/mL. A negative result does not preclude SARS-Cov-2 infection and should not be used as the sole basis for treatment or other patient management decisions. A negative result may occur with  improper specimen collection/handling, submission of specimen other than nasopharyngeal swab, presence of viral mutation(s) within the areas targeted by this assay, and inadequate number of viral copies(<138 copies/mL). A negative result must be combined with clinical observations, patient history, and epidemiological information. The expected result is Negative.  Fact Sheet for Patients:  EntrepreneurPulse.com.au  Fact Sheet for Healthcare Providers:   IncredibleEmployment.be  This test is no t yet approved or cleared by the [place] FDA and  has been authorized for detection and/or diagnosis of SARS-CoV-2 by FDA under an Emergency Use Authorization (EUA). This EUA will remain  in effect (meaning this test can be used) for the duration of the COVID-19 declaration under Section 564(b)(1) of the Act, 21 U.S.C.section 360bbb-3(b)(1), unless the authorization is terminated  or revoked sooner.       Influenza A by PCR NEGATIVE NEGATIVE Final   Influenza B by PCR NEGATIVE NEGATIVE Final    Comment: (NOTE) The Xpert Xpress SARS-CoV-2/FLU/RSV plus assay is intended as an aid in the diagnosis of influenza from Nasopharyngeal swab specimens and should not be used as a sole basis for treatment. Nasal washings and aspirates are unacceptable for Xpert Xpress SARS-CoV-2/FLU/RSV testing.  Fact Sheet for Patients: EntrepreneurPulse.com.au  Fact Sheet for Healthcare Providers: IncredibleEmployment.be  This test is not yet approved or cleared by the [place] FDA and has been authorized for detection and/or diagnosis of SARS-CoV-2 by FDA under an Emergency Use Authorization (EUA). This EUA will remain in effect (meaning this test can be used) for the duration of the COVID-19 declaration under Section 564(b)(1) of the Act, 21 U.S.C. section 360bbb-3(b)(1), unless the authorization is terminated or revoked.     Resp Syncytial Virus by PCR NEGATIVE NEGATIVE Final    Comment: (NOTE) Fact Sheet for Patients: EntrepreneurPulse.com.au  Fact Sheet for Healthcare Providers: IncredibleEmployment.be  This test is not yet approved or cleared by the [place] FDA and has been authorized for detection and/or diagnosis of SARS-CoV-2 by FDA under an Emergency Use Authorization (EUA). This EUA will remain in effect (meaning this test can be used) for  the duration of the COVID-19 declaration under Section 564(b)(1) of the Act, 21 U.S.C. section 360bbb-3(b)(1), unless the authorization is terminated or revoked.  Performed at [facility], [place] [address], [place], [place] [address]   Culture, blood (Routine X 2) w Reflex to ID Panel     Status: None (Preliminary result)   Collection Time: 08/16/22  1:16 PM   Specimen: BLOOD  Result Value Ref Range Status   Specimen Description BLOOD SITE NOT SPECIFIED  Final   Special Requests   Final    BOTTLES DRAWN AEROBIC AND ANAEROBIC Blood Culture results may not be optimal due to an inadequate volume of blood received in culture bottles   Culture   Final    NO GROWTH 3 DAYS Performed at [facility], [place] [address] [place], [place] [address]    Report Status PENDING  Incomplete  Culture, blood (Routine X 2) w Reflex to ID Panel     Status: None (Preliminary result)   Collection Time:  08/16/22  1:22 PM   Specimen: BLOOD  Result Value Ref Range Status   Specimen Description BLOOD SITE NOT SPECIFIED  Final   Special Requests   Final    BOTTLES DRAWN AEROBIC AND ANAEROBIC Blood Culture results may not be optimal due to an inadequate volume of blood received in culture bottles   Culture   Final    NO GROWTH 3 DAYS Performed at [facility], [place] [address], [place], [place] [address]    Report Status PENDING  Incomplete         Radiology Studies: No results found.      Scheduled Meds:  atorvastatin  40 mg Oral Daily   Carbidopa-Levodopa ER  2 tablet Oral TID   digoxin  0.125 mg Oral Daily   diltiazem  60 mg Oral Q8H   melatonin  3 mg Oral QHS   Continuous Infusions:  sodium chloride     azithromycin 500 mg (08/18/22 1246)   cefTRIAXone (ROCEPHIN)  IV 2 g (08/18/22 6203)   levETIRAcetam 500 mg (08/18/22 2119)          Aline August, MD [facility] 08/19/2022, 8:36 AM

## 2022-08-19 NOTE — Consult Note (Signed)
Consultation Note Date: 08/19/2022 at 1000  Patient Name: Adam Kane  DOB: 1932-09-07  MRN: [id number]  Age / Sex: 88 y.o., male  PCP: Crist Infante, MD Referring Physician: Aline August, MD  Reason for Consultation: Establishing goals of care  HPI/Patient Profile: 88 y.o. male  with past medical history of CAD (Eliquis, HLD, HTN, Parkinson's, recent hospitalization for UTI and CAD (s/p CABG) admitted on 08/14/2022 with a fall and AMS.  On admission, CT of the head revealed subdural hematoma of left hemisphere.  Neurosurgery was consulted and has recommended conservative management.  Repeat CT of head on 12/15 revealed stable and unchanged left subdural hematoma with 3 mm left right shift.  PMT was consulted to discuss goals of care.  Clinical Assessment and Goals of Care: I have reviewed medical records including EPIC notes, labs and imaging, assessed the patient and then met with patient, his daughter [name], and her husband [name] to discuss diagnosis prognosis, GOC, EOL wishes, disposition and options.  I introduced Palliative Medicine as specialized medical care for people living with serious illness. It focuses on providing relief from the symptoms and stress of a serious illness. The goal is to improve quality of life for both the patient and the family.  We discussed a brief life review of the patient.  Patient is married and lives at home with his wife.  He has 2 children, [name] and Diane.  His wife recently went to a rehab facility herself [facility]).  As far as functional and nutritional status family endorses patient has had difficulty with walking and performing ADLs independently in the weeks leading up to admission.  He has had multiple falls.  We discussed patient's current illness and what it means in the larger context of patient's on-going co-morbidities.  Natural disease trajectory was  discussed.   I attempted to elicit values and goals of care important to the patient.  Family shares that patient has already cleared himself a DNR.  [name] shares that patient has named she and her husband [name] is healthcare power of attorney.  She does not have this formal documentation with her today.  [name] is balancing a lot and trying to take care of both her mother in a rehab facility and now her father in the hospital.  We discussed that PT evaluation of patient is pending.  Discussed that patient might need rehab placement.  Family is open to this.  In light of patient likely needing to go to rehab, introduced the concept of a MOST form.  [name] shares she is familiar with a MOST form as she has 1 for her special needs daughter.  She has not completed 1 for her father.  She shares she would like to discuss it with him.  Copy of MOST left at bedside.  Family is facing treatment option, advanced care planning, and anticipatory care needs.  Concepts specific to artificial nutrition and hydration and rehospitalization discussed.  Hard choices for loving people booklet also left at bedside for family to review.  PMT will continue to follow the patient and support he and family throughout this hospitalization.  Primary Decision Maker NEXT OF KIN  Physical Exam Vitals reviewed.  Constitutional:      General: He is not in acute distress.    Appearance: Normal appearance. He is not ill-appearing.  HENT:     Head: Normocephalic.     Mouth/Throat:     Mouth: Mucous membranes are moist.  Eyes:     Pupils: Pupils are equal, round, and reactive to light.  Cardiovascular:     Rate and Rhythm: Normal rate. Rhythm irregular.     Pulses: Normal pulses.  Pulmonary:     Effort: Pulmonary effort is normal.  Abdominal:     Palpations: Abdomen is soft.  Musculoskeletal:        General: Normal range of motion.     Comments: MAETC, generalized weakness  Neurological:     Mental Status: He is alert.      Comments: Oriented to self  Psychiatric:        Mood and Affect: Mood normal.        Behavior: Behavior normal.     Palliative Assessment/Data: 50%     Thank you for this consult. Palliative medicine will continue to follow and assist holistically.   Time Total: 75 minutes Greater than 50%  of this time was spent counseling and coordinating care related to the above assessment and plan.  Signed by:  , DNP, FNP-BC Palliative Medicine    Please contact Palliative Medicine Team phone at [phone] for questions and concerns.  For individual provider: See Amion               

## 2022-08-19 NOTE — Progress Notes (Signed)
Speech Language Pathology Treatment: Dysphagia  Patient Details Name: Adam Kane MRN: [id number] DOB: Jan 19, 1933 Today's Date: 08/19/2022 Time: 8948-3475 SLP Time Calculation (min) (ACUTE ONLY): 10 min  Assessment / Plan / Recommendation Clinical Impression  Pt is alert and restless, but consumed regular solids, thin liquids, and pills administered by RN with no overt signs concerning for aspiration. He would continue to benefit from assistance including repositioning before meals, as well as assist during meals due to cognitive status. He seems appropriate to remain on regular solids and thin liquids which is his baseline. SLP to sign off acutely for swallowing.    HPI HPI: 88 year old male with history of atrial fibrillation on Eliquis, CAD status post CABG, hypertension, hyperlipidemia, Parkinson's disease, hospitalization for UTI last month presented after a fall and altered mental status.  On presentation, CT of the head showed subdural hematoma around the left hemisphere.  Patient was admitted to ICU service.  Neurosurgery was consulted and recommended conservative management.  Patient received adnexa.  Repeat CT head was stable.  He was started on Keppra.  He was transferred to [facility] service from 08/16/2022 onwards. Unfortunately, ongoing AMS and reduced alertness.      SLP Plan  All goals met      Recommendations for follow up therapy are one component of a multi-disciplinary discharge planning process, led by the attending physician.  Recommendations may be updated based on patient status, additional functional criteria and insurance authorization.    Recommendations  Diet recommendations: Regular;Thin liquid Liquids provided via: Straw;Cup Medication Administration: Whole meds with puree Supervision: Staff to assist with self feeding Compensations: Slow rate;Small sips/bites Postural Changes and/or Swallow Maneuvers: Seated upright 90 degrees                Oral Care  Recommendations: Oral care BID Follow Up Recommendations: No SLP follow up Assistance recommended at discharge: None SLP Visit Diagnosis: Dysphagia, oropharyngeal phase (R13.12) Plan: All goals met           Osie Bond., M.A. [place] Office [phone]  Secure chat preferred   08/19/2022, 3:50 PM

## 2022-08-19 NOTE — Evaluation (Signed)
Occupational Therapy Evaluation Patient Details Name: Adam Kane MRN: [id number] DOB: 08/23/1933 Today's Date: 08/19/2022   History of Present Illness Pt is a 88 year old male who presented to the [facility] ER after a fall and altered mentation.CT showed subdural hematoma around the left hemisphere.Acute metabolic encephalopathy,Possible UTI  Possible left lower lobe pneumonia  PHMx:atrial fibrillation on eliquis, CAD s/p CABG, CKD, hypertension, hyperlidpidemia and parkinson's disease   Clinical Impression   This 88 yo male admitted with above presents to acute OT with PLOF per pt of having gotten back to walking by himself with RW and doing most of his dressing. Currently he is set-up total A for basic ADLs and Max A for all mobility (bed, sit<>stand, and lateral side stepping). He will continue to benefit from acute OT with follow up OT at SNF.      Recommendations for follow up therapy are one component of a multi-disciplinary discharge planning process, led by the attending physician.  Recommendations may be updated based on patient status, additional functional criteria and insurance authorization.   Follow Up Recommendations  Skilled nursing-short term rehab (<3 hours/day)     Assistance Recommended at Discharge Frequent or constant Supervision/Assistance  Patient can return home with the following Two people to help with walking and/or transfers;A lot of help with bathing/dressing/bathroom;Assistance with cooking/housework;Assist for transportation;Assistance with feeding;Help with stairs or ramp for entrance;Direct supervision/assist for financial management;Direct supervision/assist for medications management    Functional Status Assessment  Patient has had a recent decline in their functional status and demonstrates the ability to make significant improvements in function in a reasonable and predictable amount of time.  Equipment Recommendations  Other (comment) (TBD next venue)        Precautions / Restrictions Precautions Precautions: Fall Restrictions Weight Bearing Restrictions: No      Mobility Bed Mobility Overal bed mobility: Needs Assistance Bed Mobility: Supine to Sit     Supine to sit: Max assist, HOB elevated     General bed mobility comments: VCs for sequencing, A for legs, trunk, and to scoot forward to EOB    Transfers Overall transfer level: Needs assistance Equipment used: Rolling walker (2 wheels) Transfers: Sit to/from Stand Sit to Stand: Max assist, From elevated surface           General transfer comment: Once in standing, min-mod A to maintain standing for back pericare      Balance Overall balance assessment: Needs assistance Sitting-balance support: Bilateral upper extremity supported, Feet supported Sitting balance-Leahy Scale: Poor Sitting balance - Comments: tendency for posteior lean, cues and A to correct Postural control: Posterior lean Standing balance support: Bilateral upper extremity supported Standing balance-Leahy Scale: Poor                             ADL either performed or assessed with clinical judgement   ADL Overall ADL's : Needs assistance/impaired Eating/Feeding: Set up;Supervision/ safety;Bed level   Grooming: Moderate assistance;Bed level   Upper Body Bathing: Moderate assistance;Bed level   Lower Body Bathing: Total assistance;Bed level   Upper Body Dressing : Total assistance;Bed level   Lower Body Dressing: Total assistance;Bed level   Toilet Transfer: Maximal assistance;Rolling walker (2 wheels) Toilet Transfer Details (indicate cue type and reason): side step to [place] Manipulation and Hygiene: Total assistance Toileting - Clothing Manipulation Details (indicate cue type and reason): Max A sit<>stand from EOB (raised)  Vision Patient Visual Report: No change from baseline              Pertinent Vitals/Pain Pain Assessment Pain  Assessment: Faces Faces Pain Scale: Hurts little more Pain Location: Right hand thenar emience with pressure (ie: using RW) Pain Descriptors / Indicators: Sore Pain Intervention(s): Limited activity within patient's tolerance, Monitored during session     Hand Dominance Right   Extremity/Trunk Assessment Upper Extremity Assessment Upper Extremity Assessment: Generalized weakness           Communication Communication Communication: No difficulties   Cognition Arousal/Alertness: Awake/alert Behavior During Therapy: WFL for tasks assessed/performed Overall Cognitive Status: Impaired/Different from baseline Area of Impairment: Orientation, Following commands, Safety/judgement, Awareness, Problem solving                 Orientation Level: Time (2003)     Following Commands: Follows one step commands with increased time Safety/Judgement: Decreased awareness of safety, Decreased awareness of deficits Awareness: Intellectual Problem Solving: Difficulty sequencing, Requires verbal cues, Requires tactile cues General Comments: He asked me for a chocolate milk shake, I went and made one for him, when I walked back into the room he asked what I had in my hand (cup with chocolate milkshake). When I told him he said "oh" like he didn't know I was bringing him one.                Home Living Family/patient expects to be discharged to:: Skilled nursing facility                                 Additional Comments: Wife currently at [facility] for re[facility]--per RN in speaking with family, wife is should be coming home soon      Prior Functioning/Environment Prior Level of Function : Needs assist             Mobility Comments: Pt reports he could ambulate in house with RW ADLs Comments: Pt reports he can normally dress self except for socks and shoes. He was going to [facility] 3x/week for 1/2 days. Dtr does IADLs        OT Problem List: Decreased activity  tolerance;Decreased strength;Impaired balance (sitting and/or standing);Decreased cognition;Decreased safety awareness;Pain      OT Treatment/Interventions: Self-care/ADL training;DME and/or AE instruction;Patient/family education;Balance training    OT Goals(Current goals can be found in the care plan section) Acute Rehab OT Goals Patient Stated Goal: to have a chocolate milkshake OT Goal Formulation: With patient Time For Goal Achievement: 09/02/22 Potential to Achieve Goals: Fair  OT Frequency: Min 2X/week       AM-PAC OT "6 Clicks" Daily Activity     Outcome Measure Help from another person eating meals?: A Lot Help from another person taking care of personal grooming?: A Lot Help from another person toileting, which includes using toliet, bedpan, or urinal?: Total Help from another person bathing (including washing, rinsing, drying)?: Total Help from another person to put on and taking off regular upper body clothing?: Total Help from another person to put on and taking off regular lower body clothing?: Total 6 Click Score: 8   End of Session Equipment Utilized During Treatment: Gait belt;Rolling walker (2 wheels) Nurse Communication: Mobility status  Activity Tolerance: Patient tolerated treatment well Patient left: in bed;with call bell/phone within reach;with bed alarm set  OT Visit Diagnosis: Unsteadiness on feet (R26.81);Other abnormalities of gait and mobility (R26.89);Repeated falls (R29.6);History of  falling (Z91.81);Muscle weakness (generalized) (M62.81);Pain Pain - Right/Left: Right Pain - part of body: Hand                Time: 9437-0052 OT Time Calculation (min): 27 min Charges:  OT General Charges $OT Visit: 1 Visit OT Evaluation $OT Eval Moderate Complexity: 1 Mod OT Treatments $Self Care/Home Management : 8-22 mins  [address], OTR/L Acute Re[facility] [phone] Office [phone]    Almon Register 08/19/2022,  8:34 PM

## 2022-08-20 DIAGNOSIS — S065XAA Traumatic subdural hemorrhage with loss of consciousness status unknown, initial encounter: Secondary | ICD-10-CM | POA: Diagnosis not present

## 2022-08-20 LAB — BASIC METABOLIC PANEL
Anion gap: 8 (ref 5–15)
BUN: 13 mg/dL (ref 8–23)
CO2: 24 mmol/L (ref 22–32)
Calcium: 9.3 mg/dL (ref 8.9–10.3)
Chloride: 106 mmol/L (ref 98–111)
Creatinine, Ser: 0.87 mg/dL (ref 0.61–1.24)
GFR, Estimated: >60 mL/min (ref >60)
Glucose, Bld: 116 mg/dL — ABNORMAL HIGH (ref 70–99)
Potassium: 3.4 mmol/L — ABNORMAL LOW (ref 3.5–5.1)
Sodium: 138 mmol/L (ref 135–145)

## 2022-08-20 LAB — CBC WITH DIFFERENTIAL/PLATELET
Abs Immature Granulocytes: 0.04 10*3/uL (ref 0.00–0.07)
Basophils Absolute: 0 10*3/uL (ref 0.0–0.1)
Basophils Relative: 0 %
Eosinophils Absolute: 0.1 10*3/uL (ref 0.0–0.5)
Eosinophils Relative: 1 %
HCT: 32.2 % — ABNORMAL LOW (ref 39.0–52.0)
Hemoglobin: 10.1 g/dL — ABNORMAL LOW (ref 13.0–17.0)
Immature Granulocytes: 1 %
Lymphocytes Relative: 20 %
Lymphs Abs: 1.4 10*3/uL (ref 0.7–4.0)
MCH: 25.2 pg — ABNORMAL LOW (ref 26.0–34.0)
MCHC: 31.4 g/dL (ref 30.0–36.0)
MCV: 80.3 fL (ref 80.0–100.0)
Monocytes Absolute: 0.9 10*3/uL (ref 0.1–1.0)
Monocytes Relative: 13 %
Neutro Abs: 4.6 10*3/uL (ref 1.7–7.7)
Neutrophils Relative %: 65 %
Platelets: 242 10*3/uL (ref 150–400)
RBC: 4.01 MIL/uL — ABNORMAL LOW (ref 4.22–5.81)
RDW: 16.6 % — ABNORMAL HIGH (ref 11.5–15.5)
WBC: 7 10*3/uL (ref 4.0–10.5)
nRBC: 0 % (ref 0.0–0.2)

## 2022-08-20 LAB — GLUCOSE, CAPILLARY
Glucose-Capillary: 120 mg/dL — ABNORMAL HIGH (ref 70–99)
Glucose-Capillary: 140 mg/dL — ABNORMAL HIGH (ref 70–99)
Glucose-Capillary: 100 mg/dL — ABNORMAL HIGH (ref 70–99)
Glucose-Capillary: 162 mg/dL — ABNORMAL HIGH (ref 70–99)
Glucose-Capillary: 140 mg/dL — ABNORMAL HIGH (ref 70–99)
Glucose-Capillary: 166 mg/dL — ABNORMAL HIGH (ref 70–99)
Glucose-Capillary: 117 mg/dL — ABNORMAL HIGH (ref 70–99)

## 2022-08-20 LAB — MAGNESIUM: Magnesium: 1.7 mg/dL (ref 1.7–2.4)

## 2022-08-20 LAB — PROLACTIN: Prolactin: 30.1 ng/mL (ref 3.6–32.0)

## 2022-08-20 MED ORDER — POTASSIUM CHLORIDE CRYS ER 20 MEQ PO TBCR
40.0000 meq | EXTENDED_RELEASE_TABLET | Freq: Once | ORAL | Status: AC
  Administered 2022-08-20: 40 meq via ORAL
  Filled 2022-08-20: qty 2

## 2022-08-20 MED FILL — Coagulation Fact Xa (Recomb) Inact-zhzo For IV Soln 200 MG: INTRAVENOUS | Qty: 10 | Status: AC

## 2022-08-20 NOTE — Progress Notes (Signed)
PROGRESS NOTE    Adam Kane  WJX:[id number] DOB: [birth year] DOA: 08/14/2022 PCP: Crist Infante, MD   Brief Narrative:  88 year old male with history of atrial fibrillation on Eliquis, CAD status post CABG, hypertension, hyperlipidemia, Parkinson's disease, hospitalization for UTI last month presented after a fall and altered mental status.  On presentation, CT of the head showed subdural hematoma around the left hemisphere.  Patient was admitted to ICU service.  Neurosurgery was consulted and recommended conservative management.  Patient received adnexa.  Repeat CT head was stable.  He was started on Keppra.  He was transferred to [facility] service from 08/16/2022 onwards.  Assessment & Plan:   Subdural hematoma after a fall -Was on Eliquis prior to presentation: Received adnexxa -Repeat CT head stable.  Neurosurgery evaluation appreciated: Recommended conservative management with follow-up CT head without contrast in 2 weeks.  He has been started on Keppra twice daily for 7 days.  Continue to hold Eliquis till reevaluation by neurosurgery as an outpatient  Acute metabolic encephalopathy -Possibly from above.   -Repeat CT of the head done showed stable subdural hematoma.   -Mental status is improved over the last few days: Still slow to respond -Monitor mental status.  OT recommends SNF placement.  Consult TOC. -Patient has been started on diet as per SLP recommendations.  Chronic atrial fibrillation -Continue oral digoxin and Cardizem.  Eliquis plan as above.  Rate controlled currently.  Fever Possible UTI Possible left lower lobe pneumonia -Continue Rocephin and Zithromax: Finish 5-day course of therapy.  No temperature spikes over the last few days.  Chest x-ray negative on 08/16/2022 showed left lung base patchy opacity due to atelectasis or pneumonia.  Procalcitonin less than 0.1 -Cultures negative so far  Hyperlipidemia -Continue statin  Hypertension -monitor blood pressure.   Blood pressure has much improved.  Parkinson's disease -Continue carbidopa levodopa   Anemia of chronic disease -from chronic illnesses.  Goals of care -Palliative care following.  Hypokalemia -Mild.  Replace.   DVT prophylaxis: SCDs Code Status: DNR Family Communication: Son-in-law at bedside on 08/18/2022 Disposition Plan: Status is: Inpatient Remains inpatient appropriate because: Need for SNF placement  Consultants: PCCM/neurosurgery.  Palliative care  Procedures: None  Antimicrobials:  Anti-infectives (From admission, onward)    Start     Dose/Rate Route Frequency Ordered Stop   08/17/22 1115  azithromycin (ZITHROMAX) 500 mg in sodium chloride 0.9 % 250 mL IVPB        500 mg 250 mL/hr over 60 Minutes Intravenous Every 24 hours 08/17/22 1018     08/16/22 1230  cefTRIAXone (ROCEPHIN) 2 g in sodium chloride 0.9 % 100 mL IVPB        2 g 200 mL/hr over 30 Minutes Intravenous Daily 08/16/22 1146          Subjective: Patient seen and examined at bedside.  Poor historian.  Awake, answers some questions.  Still slow to respond.  No agitation, fever, vomiting or seizures reported.   Objective: Vitals:   08/20/22 0310 08/20/22 0500 08/20/22 0525 08/20/22 0810  BP: (!) 150/83  (!) 156/103   Pulse: 99     Resp: 17     Temp: 97.9 F (36.6 C)   98.4 F (36.9 C)  TempSrc: Oral   Oral  SpO2: 93%     Weight:  101.5 kg      Intake/Output Summary (Last 24 hours) at 08/20/2022 0813 Last data filed at 08/20/2022 2130 Gross per 24 hour  Intake 2480 ml  Output  2375 ml  Net 105 ml    Filed Weights   08/18/22 0500 08/19/22 0500 08/20/22 0500  Weight: 100.2 kg 101.5 kg 101.5 kg    Examination:  General: No acute distress.  Still on room air.  Looks chronically ill and deconditioned. ENT/neck: No obvious neck masses or JVD elevation noted  respiratory: Bilateral decreased breath sounds at bases with some crackles CVS: Currently rate controlled; S1 and S2 are  heard abdominal: Soft, nontender, slightly distended; no organomegaly, normal bowel sounds heard  extremities: No clubbing; mild lower extremity edema present CNS: Awake, still slow to respond but able to answer some questions.  No focal neurologic deficit.  Moves extremities lymph: No cervical lymphadenopathy palpable  skin: No obvious rashes/petechiae psych: Not agitated.  Flat affect. Musculoskeletal: No obvious joint tenderness/deformity     Data Reviewed: I have personally reviewed following labs and imaging studies  CBC: Recent Labs  Lab 08/15/22 0646 08/16/22 0321 08/17/22 0325 08/19/22 0452 08/20/22 0410  WBC 6.4 8.6 8.1 7.6 7.0  NEUTROABS  --   --  5.4 4.8 4.6  HGB 10.4* 10.9* 9.8* 10.8* 10.1*  HCT 33.6* 34.8* 32.6* 35.3* 32.2*  MCV 80.6 79.5* 81.1 81.5 80.3  PLT 204 215 200 221 250    Basic Metabolic Panel: Recent Labs  Lab 08/15/22 0646 08/16/22 0321 08/17/22 0325 08/19/22 0452 08/20/22 0410  NA 138 136 135 137 138  K 3.5 3.7 3.4* 3.5 3.4*  CL 107 104 105 104 106  CO2 '22 22 23 22 24  '$ GLUCOSE 115* 141* 114* 108* 116*  BUN '20 21 20 16 13  '$ CREATININE 1.09 1.01 0.95 0.90 0.87  CALCIUM 9.4 9.5 9.2 9.4 9.3  MG  --  1.9 1.8 1.7 1.7    GFR: Estimated Creatinine Clearance: 73.4 mL/min (by C-G formula based on SCr of 0.87 mg/dL). Liver Function Tests: Recent Labs  Lab 08/14/22 1900 08/17/22 0325  AST 17 19  ALT 7 19  ALKPHOS 86 73  BILITOT 0.6 0.8  PROT 6.5 5.5*  ALBUMIN 3.7 2.9*    No results for input(s): "LIPASE", "AMYLASE" in the last 168 hours. No results for input(s): "AMMONIA" in the last 168 hours. Coagulation Profile: Recent Labs  Lab 08/14/22 1900  INR 1.3*    Cardiac Enzymes: No results for input(s): "CKTOTAL", "CKMB", "CKMBINDEX", "TROPONINI" in the last 168 hours. BNP (last 3 results) No results for input(s): "PROBNP" in the last 8760 hours. HbA1C: No results for input(s): "HGBA1C" in the last 72 hours. CBG: Recent Labs   Lab 08/19/22 1132 08/19/22 1553 08/19/22 1928 08/19/22 2319 08/20/22 0310  GLUCAP 100* 100* 192* 119* 120*    Lipid Profile: No results for input(s): "CHOL", "HDL", "LDLCALC", "TRIG", "CHOLHDL", "LDLDIRECT" in the last 72 hours. Thyroid Function Tests: No results for input(s): "TSH", "T4TOTAL", "FREET4", "T3FREE", "THYROIDAB" in the last 72 hours. Anemia Panel: No results for input(s): "VITAMINB12", "FOLATE", "FERRITIN", "TIBC", "IRON", "RETICCTPCT" in the last 72 hours. Sepsis Labs: Recent Labs  Lab 08/14/22 1900 08/18/22 0455 08/19/22 0705  PROCALCITON  --  <0.10 <0.10  LATICACIDVEN 2.0*  --   --      Recent Results (from the past 240 hour(s))  Resp panel by RT-PCR (RSV, Flu A&B, Covid) Anterior Nasal Swab     Status: None   Collection Time: 08/14/22  8:13 PM   Specimen: Anterior Nasal Swab  Result Value Ref Range Status   SARS Coronavirus 2 by RT PCR NEGATIVE NEGATIVE Final    Comment: (  NOTE) SARS-CoV-2 target nucleic acids are NOT DETECTED.  The SARS-CoV-2 RNA is generally detectable in upper respiratory specimens during the acute phase of infection. The lowest concentration of SARS-CoV-2 viral copies this assay can detect is 138 copies/mL. A negative result does not preclude SARS-Cov-2 infection and should not be used as the sole basis for treatment or other patient management decisions. A negative result may occur with  improper specimen collection/handling, submission of specimen other than nasopharyngeal swab, presence of viral mutation(s) within the areas targeted by this assay, and inadequate number of viral copies(<138 copies/mL). A negative result must be combined with clinical observations, patient history, and epidemiological information. The expected result is Negative.  Fact Sheet for Patients:  EntrepreneurPulse.com.au  Fact Sheet for Healthcare Providers:  IncredibleEmployment.be  This test is no t yet  approved or cleared by the [place] FDA and  has been authorized for detection and/or diagnosis of SARS-CoV-2 by FDA under an Emergency Use Authorization (EUA). This EUA will remain  in effect (meaning this test can be used) for the duration of the COVID-19 declaration under Section 564(b)(1) of the Act, 21 U.S.C.section 360bbb-3(b)(1), unless the authorization is terminated  or revoked sooner.       Influenza A by PCR NEGATIVE NEGATIVE Final   Influenza B by PCR NEGATIVE NEGATIVE Final    Comment: (NOTE) The Xpert Xpress SARS-CoV-2/FLU/RSV plus assay is intended as an aid in the diagnosis of influenza from Nasopharyngeal swab specimens and should not be used as a sole basis for treatment. Nasal washings and aspirates are unacceptable for Xpert Xpress SARS-CoV-2/FLU/RSV testing.  Fact Sheet for Patients: EntrepreneurPulse.com.au  Fact Sheet for Healthcare Providers: IncredibleEmployment.be  This test is not yet approved or cleared by the [place] FDA and has been authorized for detection and/or diagnosis of SARS-CoV-2 by FDA under an Emergency Use Authorization (EUA). This EUA will remain in effect (meaning this test can be used) for the duration of the COVID-19 declaration under Section 564(b)(1) of the Act, 21 U.S.C. section 360bbb-3(b)(1), unless the authorization is terminated or revoked.     Resp Syncytial Virus by PCR NEGATIVE NEGATIVE Final    Comment: (NOTE) Fact Sheet for Patients: EntrepreneurPulse.com.au  Fact Sheet for Healthcare Providers: IncredibleEmployment.be  This test is not yet approved or cleared by the [place] FDA and has been authorized for detection and/or diagnosis of SARS-CoV-2 by FDA under an Emergency Use Authorization (EUA). This EUA will remain in effect (meaning this test can be used) for the duration of the COVID-19 declaration under Section 564(b)(1) of  the Act, 21 U.S.C. section 360bbb-3(b)(1), unless the authorization is terminated or revoked.  Performed at [facility], [place] [address], [place], [place] [address]   Culture, blood (Routine X 2) w Reflex to ID Panel     Status: None (Preliminary result)   Collection Time: 08/16/22  1:16 PM   Specimen: BLOOD  Result Value Ref Range Status   Specimen Description BLOOD SITE NOT SPECIFIED  Final   Special Requests   Final    BOTTLES DRAWN AEROBIC AND ANAEROBIC Blood Culture results may not be optimal due to an inadequate volume of blood received in culture bottles   Culture   Final    NO GROWTH 4 DAYS Performed at [facility], [place] [address] [place], [place] [address]    Report Status PENDING  Incomplete  Culture, blood (Routine X 2) w Reflex to ID Panel     Status: None (Preliminary  result)   Collection Time: 08/16/22  1:22 PM   Specimen: BLOOD  Result Value Ref Range Status   Specimen Description BLOOD SITE NOT SPECIFIED  Final   Special Requests   Final    BOTTLES DRAWN AEROBIC AND ANAEROBIC Blood Culture results may not be optimal due to an inadequate volume of blood received in culture bottles   Culture   Final    NO GROWTH 4 DAYS Performed at [facility], [place] [address] [place], [place] [address]    Report Status PENDING  Incomplete         Radiology Studies: No results found.      Scheduled Meds:  atorvastatin  40 mg Oral Daily   Carbidopa-Levodopa ER  2 tablet Oral TID   digoxin  0.125 mg Oral Daily   diltiazem  60 mg Oral Q8H   melatonin  3 mg Oral QHS   Continuous Infusions:  sodium chloride     azithromycin Stopped (08/19/22 1325)   cefTRIAXone (ROCEPHIN)  IV Stopped (08/19/22 1042)   levETIRAcetam Stopped (08/19/22 2223)          Aline August, MD [facility] 08/20/2022, 8:13 AM

## 2022-08-20 NOTE — Evaluation (Signed)
Physical Therapy Evaluation Patient Details Name: Adam Kane MRN: [id number] DOB: Jul 15, 1933 Today's Date: 08/20/2022  History of Present Illness  Pt is a 88 year old male who presented to the [facility] ER after a fall and altered mentation.CT showed subdural hematoma around the left hemisphere.Acute metabolic encephalopathy,Possible UTI  Possible left lower lobe pneumonia  PHMx:atrial fibrillation on eliquis, CAD s/p CABG, CKD, hypertension, hyperlidpidemia and parkinson's disease  Clinical Impression  Pt admitted with/for fall and AMS, found to hand SDH.  Mobility is limited, needing moderate assist, most likely due to parkinsons more than SDH.Marland Kitchen  Pt currently limited functionally due to the problems listed. ( See problems list.)   Pt will benefit from PT to maximize function and safety in order to get ready for next venue listed below.        Recommendations for follow up therapy are one component of a multi-disciplinary discharge planning process, led by the attending physician.  Recommendations may be updated based on patient status, additional functional criteria and insurance authorization.  Follow Up Recommendations Skilled nursing-short term rehab (<3 hours/day) Can patient physically be transported by private vehicle: No    Assistance Recommended at Discharge Frequent or constant Supervision/Assistance  Patient can return home with the following  A little help with walking and/or transfers;A little help with bathing/dressing/bathroom;Assistance with cooking/housework;Assist for transportation;Help with stairs or ramp for entrance    Equipment Recommendations Other (comment) (TBD)  Recommendations for Other Services       Functional Status Assessment Patient has had a recent decline in their functional status and demonstrates the ability to make significant improvements in function in a reasonable and predictable amount of time.     Precautions / Restrictions  Precautions Precautions: Fall Restrictions Weight Bearing Restrictions: No      Mobility  Bed Mobility Overal bed mobility: Needs Assistance Bed Mobility: Supine to Sit     Supine to sit: HOB elevated, Mod assist, Max assist     General bed mobility comments: VCs for sequencing, A for legs, trunk, and to scoot forward to EOB    Transfers Overall transfer level: Needs assistance Equipment used: Rolling walker (2 wheels) Transfers: Sit to/from Stand Sit to Stand: From elevated surface, Mod assist           General transfer comment: Once in standing, min assist to maintain standing holding to the IV pole    Ambulation/Gait Ambulation/Gait assistance: Mod assist Gait Distance (Feet): 18 Feet Assistive device: Rolling walker (2 wheels) Gait Pattern/deviations: Step-to pattern, Step-through pattern, Decreased step length - right, Decreased step length - left, Decreased stride length, Scissoring   Gait velocity interpretation: <1.31 ft/sec, indicative of household ambulator   General Gait Details: halted gait, mild instability, scissoring, mild lock up with changes in direction/turns/ backing up.  Stairs            Wheelchair Mobility    Modified Rankin (Stroke Patients Only)       Balance Overall balance assessment: Needs assistance Sitting-balance support: Bilateral upper extremity supported, Feet supported, No upper extremity supported Sitting balance-Leahy Scale: Poor Sitting balance - Comments: tendency for posteior lean.  pt does not fall backward, but lists posteriorly with feet coming off the floor until assisted to sit back forward. Postural control: Posterior lean Standing balance support: Bilateral upper extremity supported Standing balance-Leahy Scale: Poor  Pertinent Vitals/Pain Pain Assessment Pain Assessment: Faces Faces Pain Scale: Hurts a little bit Pain Location: Right hand thenar emience with  pressure (ie: using RW) Pain Descriptors / Indicators: Sore Pain Intervention(s): Monitored during session    Home Living Family/patient expects to be discharged to:: Skilled nursing facility                   Additional Comments: Wife currently at [facility] for rehab--per RN in speaking with family, wife is should be coming home soon    Prior Function Prior Level of Function : Needs assist             Mobility Comments: Pt reports he could ambulate in house with RW ADLs Comments: Pt reports he can normally dress self except for socks and shoes. He was going to [facility] 3x/week for 1/2 days. Dtr does IADLs     Hand Dominance   Dominant Hand: Right    Extremity/Trunk Assessment   Upper Extremity Assessment Upper Extremity Assessment: Defer to OT evaluation    Lower Extremity Assessment Lower Extremity Assessment: Overall WFL for tasks assessed (limited coordination mostly due to parkinsons)    Cervical / Trunk Assessment Cervical / Trunk Assessment: Normal  Communication   Communication: No difficulties  Cognition Arousal/Alertness: Awake/alert Behavior During Therapy: WFL for tasks assessed/performed Overall Cognitive Status: Impaired/Different from baseline Area of Impairment: Orientation, Following commands, Safety/judgement, Awareness, Problem solving                 Orientation Level: Time (2003)     Following Commands: Follows one step commands with increased time Safety/Judgement: Decreased awareness of safety, Decreased awareness of deficits Awareness: Intellectual Problem Solving: Difficulty sequencing, Requires verbal cues, Requires tactile cues          General Comments      Exercises     Assessment/Plan    PT Assessment Patient needs continued PT services  PT Problem List Decreased activity tolerance;Decreased balance;Decreased mobility;Decreased coordination;Decreased cognition;Decreased safety awareness       PT  Treatment Interventions Gait training;Functional mobility training;DME instruction;Therapeutic activities;Therapeutic exercise;Balance training;Neuromuscular re-education;Patient/family education    PT Goals (Current goals can be found in the Care Plan section)  Acute Rehab PT Goals Patient Stated Goal: pt did not participate with goals PT Goal Formulation: Patient unable to participate in goal setting Time For Goal Achievement: 09/03/22 Potential to Achieve Goals: Good    Frequency Min 3X/week     Co-evaluation               AM-PAC PT "6 Clicks" Mobility  Outcome Measure Help needed turning from your back to your side while in a flat bed without using bedrails?: A Lot Help needed moving from lying on your back to sitting on the side of a flat bed without using bedrails?: A Lot Help needed moving to and from a bed to a chair (including a wheelchair)?: A Lot Help needed standing up from a chair using your arms (e.g., wheelchair or bedside chair)?: A Lot Help needed to walk in hospital room?: A Lot Help needed climbing 3-5 steps with a railing? : A Lot 6 Click Score: 12    End of Session   Activity Tolerance: Patient tolerated treatment well Patient left: in chair;with call bell/phone within reach;with chair alarm set Nurse Communication: Mobility status PT Visit Diagnosis: Unsteadiness on feet (R26.81);Other abnormalities of gait and mobility (R26.89);Difficulty in walking, not elsewhere classified (R26.2)    Time: 1610-9604 PT Time Calculation (min) (  ACUTE ONLY): 36 min   Charges:   PT Evaluation $PT Eval Moderate Complexity: 1 Mod PT Treatments $Gait Training: 8-22 mins        08/20/2022  [name], PT Acute Rehabilitation Services [phone]  (office)  Tessie Fass Renny Gunnarson 08/20/2022, 12:44 PM

## 2022-08-20 NOTE — Care Management Important Message (Signed)
Important Message  Patient Details  Name: Adam Kane MRN: [id number] Date of Birth: 1933-01-17   Medicare Important Message Given:  Yes     Hannah Beat 08/20/2022, 3:08 PM

## 2022-08-21 ENCOUNTER — Inpatient Hospital Stay (HOSPITAL_COMMUNITY): Payer: Medicare Other

## 2022-08-21 DIAGNOSIS — S065XAA Traumatic subdural hemorrhage with loss of consciousness status unknown, initial encounter: Secondary | ICD-10-CM | POA: Diagnosis not present

## 2022-08-21 DIAGNOSIS — R4182 Altered mental status, unspecified: Secondary | ICD-10-CM

## 2022-08-21 LAB — CULTURE, BLOOD (ROUTINE X 2)
Culture: NO GROWTH
Culture: NO GROWTH

## 2022-08-21 LAB — GLUCOSE, CAPILLARY
Glucose-Capillary: 131 mg/dL — ABNORMAL HIGH (ref 70–99)
Glucose-Capillary: 110 mg/dL — ABNORMAL HIGH (ref 70–99)
Glucose-Capillary: 147 mg/dL — ABNORMAL HIGH (ref 70–99)
Glucose-Capillary: 136 mg/dL — ABNORMAL HIGH (ref 70–99)
Glucose-Capillary: 140 mg/dL — ABNORMAL HIGH (ref 70–99)
Glucose-Capillary: 115 mg/dL — ABNORMAL HIGH (ref 70–99)

## 2022-08-21 MED ORDER — FOLIC ACID 1 MG PO TABS
1.0000 mg | ORAL_TABLET | Freq: Every day | ORAL | Status: DC
  Administered 2022-08-21 – 2022-08-27 (×7): 1 mg via ORAL
  Filled 2022-08-21 (×7): qty 1

## 2022-08-21 NOTE — TOC Initial Note (Signed)
Transition of Care [facility]) - Initial/Assessment Note    Patient Details  Name: Adam Kane MRN: [id number] Date of Birth: [birth year]  [facility] CM/SW Contact:    Vinie Sill, LCSW Phone Number: 08/21/2022, 12:38 PM  Clinical Narrative:          CSW spoke with patient's daughter, [name] by phone- CSW introduced self and explained role. [name] states patient lives at home and his spouse is currently at [facility]. She is trying to find ALF. CSW informed of  recommendations for short term rehab at [facility] and explained the SNF process. She states she agrees with the recommendations and CSW was given permission to send out SNF referrals. Preferred SNF is [facility]. She inquired about patients medication and expressed concerns about being medically stable for discharge. CSW deferred to Attending.   MD updated- daughter requested call TOC will provide bed offers once available TOC will continue to follow and assist with discharge planning.  Thurmond Butts, MSW, LCSW Clinical Social Worker          Planned Disposition: Skilled Nursing Facility Barriers to Discharge: Ship broker, SNF Pending bed offer   Patient Goals and CMS Choice            Expected Discharge Plan and Services Planned Disposition: [place] In-house Referral: Clinical Social Work     Living arrangements for the past 2 months: Single Family Home                                      Prior Living Arrangements/Services Living arrangements for the past 2 months: Single Family Home Lives with:: Self, Spouse Patient language and need for interpreter reviewed:: No Do you feel safe going back to the place where you live?: No   family wants SNF placement  Need for Family Participation in Patient Care: Yes (Comment) Care giver support system in place?: Yes (comment)   Criminal Activity/Legal Involvement Pertinent to Current Situation/Hospitalization: No -  Comment as needed  Activities of Daily Living      Permission Sought/Granted         Permission granted to share info w AGENCY: SNFs        Emotional Assessment           Psych Involvement: No (comment)  Admission diagnosis:  Subdural hematoma ([place]) [S06.5XAA] Anticoagulated [Z79.01] Osteophytosis [M25.70] Fall, initial encounter [W19.XXXA] Patient Active Problem List   Diagnosis Date Noted   Subdural hematoma ([place]) 08/14/2022   UTI (urinary tract infection) 07/12/2022   Dyslipidemia (high LDL; low HDL) 07/15/2018   Long term current use of anticoagulant 07/15/2018   Lumbar disc herniation 01/15/2018   Parkinson's disease 08/01/2017   Coronary artery disease involving native coronary artery of native heart without angina pectoris 07/10/2017   Acute renal insufficiency 08/22/2014   AAA (abdominal aortic aneurysm) without rupture ([place]) 08/22/2014   Diverticulosis of colon without hemorrhage 08/22/2014   Right ureteral stone 08/21/2014   Hyperlipidemia 05/31/2013   Essential hypertension 03/03/2013   Leukocytosis, unspecified 03/03/2013   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

## 2022-08-21 NOTE — Consult Note (Addendum)
Neurology Consultation  Reason for Consult: Confusion  Referring Physician: Dr. Starla Link  CC: fall  History is obtained from:medical record and patient   HPI: Adam Kane is a 88 y.o. male with past medical history of A fib on Eliquis, CAD, CKD, HTN, HLD, parkinsons, s/p CABG, who presented to the hospital on 12/14 after experiencing a fall and hitting his head. He was reversed with andexxa, Neurosurgery consulted no surgical intervention at this time with medical management. He was placed on Keppra for 7 days for seizure ppx.  CT head 12/14 with left SDH, repeat CT head was stable.  MRI brain with slight increase in size of L SDH, with left temporal lobe SDH and posterior interhemispheric SDH improved and left tentorial SDH unchanged. Routine EEG was normal.  Neurology consulted for intermittent confusion   Daughter and daughters husband at the bedside. She was concerned about intermittent confusion and some blank staring spells and had wanted neurology to see him due to the SDH. She had lots of concerns related to his Eliquis being held for 2 weeks and being lost to follow up since he will be going to rehab center after discharge. She had questions related to follow with his neurologist, Dr. Carles Collet    ROS: Full ROS was performed and is negative except as noted in the HPI.    Past Medical History:  Diagnosis Date   A-fib [facility])    Abdominal aortic aneurysm (AAA), 30-34 mm diameter (HCC)    Arthritis    CAD (coronary artery disease)    Cancer (HCC)    prostate cancer 2009   Chronic kidney disease    kidney stone   Complication of anesthesia    if given anesthesia too quickly experiences nausea and vomiting    Diverticulosis of colon without hemorrhage    Dyslipidemia    Dysrhythmia    Early-onset Parkinson's disease    GERD (gastroesophageal reflux disease)    Heart murmur    childhood   History of kidney stones    PONV (postoperative nausea and vomiting)    S/P CABG (coronary  artery bypass graft) 2001   Shortness of breath    mild exertion due to afib   Systemic hypertension    Urinary leakage      Family History  Problem Relation Age of Onset   Cancer Mother        bone   Breast cancer Mother    AAA (abdominal aortic aneurysm) Father    Throat cancer Father        mouth     Social History:   reports that he quit smoking about 49 years ago. His smoking use included cigarettes. He has never used smokeless tobacco. He reports that he does not drink alcohol and does not use drugs.  Medications  Current Facility-Administered Medications:    0.9 %  sodium chloride infusion, , Intravenous, PRN, Starla Link, Kshitiz, MD   acetaminophen (TYLENOL) tablet 650 mg, 650 mg, Oral, Q6H PRN, Starla Link, Kshitiz, MD, 650 mg at 08/21/22 0901   atorvastatin (LIPITOR) tablet 40 mg, 40 mg, Oral, Daily, Jennelle Human B, NP, 40 mg at 08/21/22 0901   benzonatate (TESSALON) capsule 200 mg, 200 mg, Oral, TID PRN, Freddi Starr, MD   Carbidopa-Levodopa ER (SINEMET CR) 25-100 MG tablet controlled release 2 tablet, 2 tablet, Oral, TID, Starla Link, Kshitiz, MD, 2 tablet at 08/21/22 1541   dextromethorphan (DELSYM) 30 MG/5ML liquid 30 mg, 30 mg, Oral, BID PRN, Freddi Starr, MD,  30 mg at 08/14/22 2351   digoxin (LANOXIN) tablet 0.125 mg, 0.125 mg, Oral, Daily, Alekh, Kshitiz, MD, 0.125 mg at 08/21/22 0901   diltiazem (CARDIZEM) tablet 60 mg, 60 mg, Oral, Q8H, Simpson, Paula B, NP, 60 mg at 08/21/22 1541   docusate sodium (COLACE) capsule 100 mg, 100 mg, Oral, BID PRN, Freddi Starr, MD   folic acid (FOLVITE) tablet 1 mg, 1 mg, Oral, Daily, Alekh, Kshitiz, MD, 1 mg at 08/21/22 1540   haloperidol lactate (HALDOL) injection 1-2 mg, 1-2 mg, Intravenous, Q6H PRN, Starla Link, Kshitiz, MD   labetalol (NORMODYNE) injection 20 mg, 20 mg, Intravenous, Q2H PRN, Icard, Bradley L, DO, 20 mg at 08/15/22 1845   levETIRAcetam (KEPPRA) IVPB 500 mg/100 mL premix, 500 mg, Intravenous, BID, Starla Link, Kshitiz,  MD, Stopped at 08/21/22 1146   melatonin tablet 3 mg, 3 mg, Oral, QHS, Alekh, Kshitiz, MD, 3 mg at 08/20/22 2113   polyethylene glycol (MIRALAX / GLYCOLAX) packet 17 g, 17 g, Oral, Daily PRN, Freddi Starr, MD   Exam: Current vital signs: BP 109/82 (BP Location: Left Arm)   Pulse 100   Temp 98.3 F (36.8 C) (Axillary)   Resp (!) 21   Wt 103.8 kg   SpO2 98%   BMI 27.85 kg/m  Vital signs in last 24 hours: Temp:  [97.7 F (36.5 C)-98.8 F (37.1 C)] 98.3 F (36.8 C) (12/21 1545) Pulse Rate:  [83-100] 100 (12/21 1545) Resp:  [16-21] 21 (12/21 1545) BP: (104-155)/(64-92) 109/82 (12/21 1545) SpO2:  [90 %-99 %] 98 % (12/21 1545) Weight:  [103.8 kg] 103.8 kg (12/21 0347)  GENERAL: Awake, alert in NAD HEENT: - Normocephalic and atraumatic, dry mm LUNGS - Clear to auscultation bilaterally with no wheezes CV - S1S2 RRR, no m/r/g, equal pulses bilaterally. ABDOMEN - Soft, nontender, nondistended with normoactive BS Ext: multiple bruising on right arm and right leg, warm, well perfused, intact peripheral pulses, no edema  NEURO:  Mental Status: AA&Ox4 Language: speech is dysarthric .  Naming, repetition, fluency, and comprehension intact. Cranial Nerves: PERRL EOMI, visual fields full, no facial asymmetry, facial sensation intact, hearing intact, tongue/uvula/soft palate midline, normal sternocleidomastoid and trapezius muscle strength. No evidence of tongue atrophy or fibrillations Motor: 4/5 in all 4 extremities Tone: is normal and bulk is normal Sensation- Intact to light touch bilaterally Coordination: FTN intact bilaterally, no ataxia in BLE. Gait- deferred   Labs I have reviewed labs in epic and the results pertinent to this consultation are:  CBC    Component Value Date/Time   WBC 7.0 08/20/2022 0410   RBC 4.01 (L) 08/20/2022 0410   HGB 10.1 (L) 08/20/2022 0410   HCT 32.2 (L) 08/20/2022 0410   PLT 242 08/20/2022 0410   MCV 80.3 08/20/2022 0410   MCH 25.2 (L)  08/20/2022 0410   MCHC 31.4 08/20/2022 0410   RDW 16.6 (H) 08/20/2022 0410   LYMPHSABS 1.4 08/20/2022 0410   MONOABS 0.9 08/20/2022 0410   EOSABS 0.1 08/20/2022 0410   BASOSABS 0.0 08/20/2022 0410    CMP     Component Value Date/Time   NA 138 08/20/2022 0410   K 3.4 (L) 08/20/2022 0410   CL 106 08/20/2022 0410   CO2 24 08/20/2022 0410   GLUCOSE 116 (H) 08/20/2022 0410   BUN 13 08/20/2022 0410   CREATININE 0.87 08/20/2022 0410   CALCIUM 9.3 08/20/2022 0410   PROT 5.5 (L) 08/17/2022 0325   ALBUMIN 2.9 (L) 08/17/2022 0325   AST 19 08/17/2022 0325  ALT 19 08/17/2022 0325   ALKPHOS 73 08/17/2022 0325   BILITOT 0.8 08/17/2022 0325   GFRNONAA >60 08/20/2022 0410   GFRAA >60 01/12/2018 1605    Lipid Panel  No results found for: "CHOL", "TRIG", "HDL", "CHOLHDL", "VLDL", "LDLCALC", "LDLDIRECT"   Imaging I have reviewed the images obtained:  CT-head 12/14: 1. Extensive subdural hematoma around the left hemisphere. This measures 4 mm in thickness in the left lateral frontal lobe. There is subdural blood inferior to the left temporal lobe measuring 14 mm in thickness. Subdural hematoma along the left tentorium and posterior falx measuring 5 mm. 2. Atrophy and chronic microvascular ischemic change. No acute infarct. 3. Negative for cervical spine fracture.  Repeat CT head 12/15: 1. Stable left subdural hematoma with extension along the left tentorium, falx cerebri, and inferior to the temporal lobe. Stable 3 mm left-to-right midline shift. 2. No new acute intracranial abnormality.  Repeat CT head 12/15: 1. Unchanged left-sided subdural hematoma and 3 mm of rightward midline shift. 2. Increased hypodensity in the anterior left temporal lobe, indeterminate for worsening edema (such as from infarct or infection) versus artifact  MRI examination of the brain 12/21 1. Left hemispheric subdural hematoma has increased in size compared with the prior CT. There is mass-effect on the  left hemisphere and 3 mm midline shift to the right. 2. Subdural hematoma inferior to the left temporal lobe has mildly improved. Posterior interhemispheric subdural hematoma slightly improved. Left tentorial subdural hematoma unchanged. 3. Moderate atrophy. No acute infarct  Routine EEG 12/21: This study is within normal limits. No seizures or epileptiform discharges were seen throughout the recording.   Assessment:  88 y.o. male with past medical history of A fib on Eliquis, CAD, CKD, HTN, HLD, parkinsons, s/p CABG, who presented to the hospital on 12/14 after experiencing a fall and hitting his head. He was found to have a SDH and reversed with andexxa. Been off eliquis since and gradually improving. Still somewhat encephalopathic and thus neurology consulted.  Recommendations: - Follow up with outpatient Neurosurgery - Follow up with outpatient Neurology  - Continue to hold Eliquis and restart when OK with neurosurgery  - Neurology will sign off. Please call with questions or concerns   Beulah Gandy DNP, ACNPC-AG  [facility]   NEUROHOSPITALIST ADDENDUM Performed a face to face diagnostic evaluation.   I have reviewed the contents of history and physical exam as documented by PA/ARNP/Resident and agree with above documentation.  I have discussed and formulated the above plan as documented. Edits to the note have been made as needed.  Impression/Key exam findings/Plan: He looks good on my exam and oriented x 4 and knows he came to the hospital after a fall. His exam is non focal with intact strength. MRI Brain with ?increase in size of SDH vs just difference due to imaging modality and Dr. Starla Link to run by neurosurgery team. rEEG negative.  I spoke with daughter Adam Kane and answered all of her questions. No seizures seen here or noted by the daughter.  No further recs at this time. We will signoff. Can start eliquis if and when cleared by neurosurgery.  Also discussed with  Dr. Starla Link over secure chat.  Donnetta Simpers, MD [facility] [phone]   If 7pm to 7am, please call on call as listed on AMION.

## 2022-08-21 NOTE — NC FL2 (Signed)
[place] LEVEL OF CARE FORM     IDENTIFICATION  Patient Name: Adam Kane Birthdate: August 01, 1933 Sex: male Admission Date (Current Location): 08/14/2022  [facility] and [place] Number:  Herbalist and Address:  [facility] [place], [place] [address] [place], [place] [address]      Provider Number: [id number]  Attending Physician Name and Address:  Aline August, MD  Relative Name and Phone Number:       Current Level of Care: Hospital Recommended Level of Care: [place] Prior Approval Number:    Date Approved/Denied:   PASRR Number: [id number]  Discharge Plan: SNF    Current Diagnoses: Patient Active Problem List   Diagnosis Date Noted   Subdural hematoma ([place]) 08/14/2022   UTI (urinary tract infection) 07/12/2022   Dyslipidemia (high LDL; low HDL) 07/15/2018   Long term current use of anticoagulant 07/15/2018   Lumbar disc herniation 01/15/2018   Parkinson's disease 08/01/2017   Coronary artery disease involving native coronary artery of native heart without angina pectoris 07/10/2017   Acute renal insufficiency 08/22/2014   AAA (abdominal aortic aneurysm) without rupture ([place]) 08/22/2014   Diverticulosis of colon without hemorrhage 08/22/2014   Right ureteral stone 08/21/2014   Hyperlipidemia 05/31/2013   Essential hypertension 03/03/2013   Leukocytosis, unspecified 03/03/2013   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 03/02/2013   Chronic a-fib ([place]) 03/02/2013    Orientation RESPIRATION BLADDER Height & Weight     Self, Time, Situation, Place  Normal External catheter, Incontinent Weight: 228 lb 13.4 oz (103.8 kg) Height:     BEHAVIORAL SYMPTOMS/MOOD NEUROLOGICAL BOWEL NUTRITION STATUS      Incontinent Diet (please see discharge summary)  AMBULATORY STATUS COMMUNICATION OF NEEDS Skin   Limited Assist Verbally                         Personal Care Assistance  Level of Assistance  Bathing, Feeding, Dressing Bathing Assistance: Limited assistance Feeding assistance: Independent Dressing Assistance: Limited assistance     Functional Limitations Info  Sight, Hearing, Speech Sight Info: Adequate Hearing Info: Adequate Speech Info: Adequate    SPECIAL CARE FACTORS FREQUENCY  PT (By licensed PT), OT (By licensed OT)     PT Frequency: 5x per week OT Frequency: 5x per week            Contractures      Additional Factors Info  Code Status, Allergies Code Status Info: DNR Allergies Info: Dilaudid and morphine           Current Medications (08/21/2022):  This is the current hospital active medication list Current Facility-Administered Medications  Medication Dose Route Frequency Provider Last Rate Last Admin   0.9 %  sodium chloride infusion   Intravenous PRN Starla Link, Kshitiz, MD       acetaminophen (TYLENOL) tablet 650 mg  650 mg Oral Q6H PRN Aline August, MD   650 mg at 08/21/22 0901   atorvastatin (LIPITOR) tablet 40 mg  40 mg Oral Daily Jennelle Human B, NP   40 mg at 08/21/22 0901   benzonatate (TESSALON) capsule 200 mg  200 mg Oral TID PRN Freddi Starr, MD       Carbidopa-Levodopa ER (SINEMET CR) 25-100 MG tablet controlled release 2 tablet  2 tablet Oral TID Aline August, MD   2 tablet at 08/21/22 1225   dextromethorphan (DELSYM) 30 MG/5ML liquid 30 mg  30 mg Oral  BID PRN Freddi Starr, MD   30 mg at 08/14/22 2351   digoxin (LANOXIN) tablet 0.125 mg  0.125 mg Oral Daily Starla Link, Kshitiz, MD   0.125 mg at 08/21/22 0901   diltiazem (CARDIZEM) tablet 60 mg  60 mg Oral Q8H Jennelle Human B, NP   60 mg at 08/21/22 2947   docusate sodium (COLACE) capsule 100 mg  100 mg Oral BID PRN Freddi Starr, MD       haloperidol lactate (HALDOL) injection 1-2 mg  1-2 mg Intravenous Q6H PRN Aline August, MD       labetalol (NORMODYNE) injection 20 mg  20 mg Intravenous Q2H PRN Icard, Bradley L, DO   20 mg at 08/15/22 1845    levETIRAcetam (KEPPRA) IVPB 500 mg/100 mL premix  500 mg Intravenous BID Aline August, MD 400 mL/hr at 08/21/22 1125 500 mg at 08/21/22 1125   melatonin tablet 3 mg  3 mg Oral QHS Alekh, Kshitiz, MD   3 mg at 08/20/22 2113   polyethylene glycol (MIRALAX / GLYCOLAX) packet 17 g  17 g Oral Daily PRN Freddi Starr, MD         Discharge Medications: Please see discharge summary for a list of discharge medications.  Relevant Imaging Results:  Relevant Lab Results:   Additional Information [id number]  Vinie Sill, LCSW

## 2022-08-21 NOTE — Progress Notes (Addendum)
PROGRESS NOTE    Adam Kane  ZDG:[id number] DOB: 1933-08-12 DOA: 08/14/2022 PCP: Crist Infante, MD   Brief Narrative:  88 year old male with history of atrial fibrillation on Eliquis, CAD status post CABG, hypertension, hyperlipidemia, Parkinson's disease, hospitalization for UTI last month presented after a fall and altered mental status.  On presentation, CT of the head showed subdural hematoma around the left hemisphere.  Patient was admitted to ICU service.  Neurosurgery was consulted and recommended conservative management.  Patient received adnexa.  Repeat CT head was stable.  He was started on Keppra.  He was transferred to [facility] service from 08/16/2022 onwards.  Assessment & Plan:   Subdural hematoma after a fall -Was on Eliquis prior to presentation: Received adnexxa -Repeat CT head stable.  Neurosurgery evaluation appreciated: Recommended conservative management with follow-up CT head without contrast in 2 weeks.  He has been started on Keppra twice daily for 7 days.  Continue to hold Eliquis till reevaluation by neurosurgery as an outpatient  Acute metabolic encephalopathy -Possibly from above.   -Repeat CT of the head done showed stable subdural hematoma.   -Mental status is improved over the last few days: Still slow to respond -Monitor mental status.   -PT/OT recommend SNF placement.  TOC following. -Patient has been started on diet as per SLP recommendations.  Chronic atrial fibrillation -Continue oral digoxin and Cardizem.  Eliquis plan as above.  Rate controlled currently.  Fever Possible UTI Possible left lower lobe pneumonia -Completed 5-day course of Rocephin and Zithromax on 08/20/2022 -No temperature spikes over the last few days.   -Cultures negative so far  Hyperlipidemia -Continue statin  Hypertension -monitor blood pressure.  Blood pressure has much improved.  Continue Cardizem  Parkinson's disease -Continue carbidopa levodopa   Anemia of  chronic disease -from chronic illnesses.  Goals of care -Palliative care following.  Hypokalemia -No labs today.  DVT prophylaxis: SCDs Code Status: DNR Family Communication: spoke to daughter on phone Disposition Plan: Status is: Inpatient Remains inpatient appropriate because: Need for SNF placement.  Currently medically stable for discharge  Consultants: PCCM/neurosurgery.  Palliative care  Procedures: None  Antimicrobials:  Anti-infectives (From admission, onward)    Start     Dose/Rate Route Frequency Ordered Stop   08/17/22 1115  azithromycin (ZITHROMAX) 500 mg in sodium chloride 0.9 % 250 mL IVPB        500 mg 250 mL/hr over 60 Minutes Intravenous Every 24 hours 08/17/22 1018 08/23/22 0959   08/16/22 1230  cefTRIAXone (ROCEPHIN) 2 g in sodium chloride 0.9 % 100 mL IVPB  Status:  Discontinued        2 g 200 mL/hr over 30 Minutes Intravenous Daily 08/16/22 1146 08/20/22 1431        Subjective: Patient seen and examined at bedside.  Poor historian.  No seizures, fever, vomiting reported.  Slow to respond.   Objective: Vitals:   08/20/22 2315 08/21/22 0339 08/21/22 0347 08/21/22 0717  BP: 118/71 (!) 155/89  (!) 151/92  Pulse: 89 88  100  Resp: '18 20  16  '$ Temp: 98.2 F (36.8 C) 98.8 F (37.1 C)  97.7 F (36.5 C)  TempSrc: Oral Oral  Oral  SpO2: 92% 99%  90%  Weight:   103.8 kg     Intake/Output Summary (Last 24 hours) at 08/21/2022 0819 Last data filed at 08/21/2022 0540 Gross per 24 hour  Intake 1080 ml  Output 2900 ml  Net -1820 ml    [employer]  08/19/22 0500 08/20/22 0500 08/21/22 0347  Weight: 101.5 kg 101.5 kg 103.8 kg    Examination:  General: On room air.  No distress currently.  Looks chronically ill and deconditioned. ENT/neck: JVD is not elevated and no obvious thyromegaly palpable respiratory: Decreased breath sounds at bases with scattered rales  CVS: S1-S2 heard; intermittently tachycardic  abdominal: Soft, nontender,  distended mildly; no organomegaly, bowel sounds heard normally  extremities: Trace lower extremity edema present; no cyanosis  CNS: Awake, still extremely slow to respond but able to answer some questions.  No focal neurologic deficit.  Able to move extremities  lymph: No obvious lymphadenopathy palpable skin: No petechiae/lesions  psych: Extremely flat affect.  Currently not agitated Musculoskeletal: No obvious joint swelling/tenderness/deformity     Data Reviewed: I have personally reviewed following labs and imaging studies  CBC: Recent Labs  Lab 08/15/22 0646 08/16/22 0321 08/17/22 0325 08/19/22 0452 08/20/22 0410  WBC 6.4 8.6 8.1 7.6 7.0  NEUTROABS  --   --  5.4 4.8 4.6  HGB 10.4* 10.9* 9.8* 10.8* 10.1*  HCT 33.6* 34.8* 32.6* 35.3* 32.2*  MCV 80.6 79.5* 81.1 81.5 80.3  PLT 204 215 200 221 564    Basic Metabolic Panel: Recent Labs  Lab 08/15/22 0646 08/16/22 0321 08/17/22 0325 08/19/22 0452 08/20/22 0410  NA 138 136 135 137 138  K 3.5 3.7 3.4* 3.5 3.4*  CL 107 104 105 104 106  CO2 '22 22 23 22 24  '$ GLUCOSE 115* 141* 114* 108* 116*  BUN '20 21 20 16 13  '$ CREATININE 1.09 1.01 0.95 0.90 0.87  CALCIUM 9.4 9.5 9.2 9.4 9.3  MG  --  1.9 1.8 1.7 1.7    GFR: Estimated Creatinine Clearance: 73.4 mL/min (by C-G formula based on SCr of 0.87 mg/dL). Liver Function Tests: Recent Labs  Lab 08/14/22 1900 08/17/22 0325  AST 17 19  ALT 7 19  ALKPHOS 86 73  BILITOT 0.6 0.8  PROT 6.5 5.5*  ALBUMIN 3.7 2.9*    No results for input(s): "LIPASE", "AMYLASE" in the last 168 hours. No results for input(s): "AMMONIA" in the last 168 hours. Coagulation Profile: Recent Labs  Lab 08/14/22 1900  INR 1.3*    Cardiac Enzymes: No results for input(s): "CKTOTAL", "CKMB", "CKMBINDEX", "TROPONINI" in the last 168 hours. BNP (last 3 results) No results for input(s): "PROBNP" in the last 8760 hours. HbA1C: No results for input(s): "HGBA1C" in the last 72 hours. CBG: Recent  Labs  Lab 08/20/22 1932 08/20/22 2011 08/20/22 2326 08/21/22 0337 08/21/22 0749  GLUCAP 166* 140* 117* 131* 110*    Lipid Profile: No results for input(s): "CHOL", "HDL", "LDLCALC", "TRIG", "CHOLHDL", "LDLDIRECT" in the last 72 hours. Thyroid Function Tests: No results for input(s): "TSH", "T4TOTAL", "FREET4", "T3FREE", "THYROIDAB" in the last 72 hours. Anemia Panel: No results for input(s): "VITAMINB12", "FOLATE", "FERRITIN", "TIBC", "IRON", "RETICCTPCT" in the last 72 hours. Sepsis Labs: Recent Labs  Lab 08/14/22 1900 08/18/22 0455 08/19/22 0705  PROCALCITON  --  <0.10 <0.10  LATICACIDVEN 2.0*  --   --      Recent Results (from the past 240 hour(s))  Resp panel by RT-PCR (RSV, Flu A&B, Covid) Anterior Nasal Swab     Status: None   Collection Time: 08/14/22  8:13 PM   Specimen: Anterior Nasal Swab  Result Value Ref Range Status   SARS Coronavirus 2 by RT PCR NEGATIVE NEGATIVE Final    Comment: (NOTE) SARS-CoV-2 target nucleic acids are NOT DETECTED.  The SARS-CoV-2  RNA is generally detectable in upper respiratory specimens during the acute phase of infection. The lowest concentration of SARS-CoV-2 viral copies this assay can detect is 138 copies/mL. A negative result does not preclude SARS-Cov-2 infection and should not be used as the sole basis for treatment or other patient management decisions. A negative result may occur with  improper specimen collection/handling, submission of specimen other than nasopharyngeal swab, presence of viral mutation(s) within the areas targeted by this assay, and inadequate number of viral copies(<138 copies/mL). A negative result must be combined with clinical observations, patient history, and epidemiological information. The expected result is Negative.  Fact Sheet for Patients:  EntrepreneurPulse.com.au  Fact Sheet for Healthcare Providers:  IncredibleEmployment.be  This test is no t yet  approved or cleared by the [place] FDA and  has been authorized for detection and/or diagnosis of SARS-CoV-2 by FDA under an Emergency Use Authorization (EUA). This EUA will remain  in effect (meaning this test can be used) for the duration of the COVID-19 declaration under Section 564(b)(1) of the Act, 21 U.S.C.section 360bbb-3(b)(1), unless the authorization is terminated  or revoked sooner.       Influenza A by PCR NEGATIVE NEGATIVE Final   Influenza B by PCR NEGATIVE NEGATIVE Final    Comment: (NOTE) The Xpert Xpress SARS-CoV-2/FLU/RSV plus assay is intended as an aid in the diagnosis of influenza from Nasopharyngeal swab specimens and should not be used as a sole basis for treatment. Nasal washings and aspirates are unacceptable for Xpert Xpress SARS-CoV-2/FLU/RSV testing.  Fact Sheet for Patients: EntrepreneurPulse.com.au  Fact Sheet for Healthcare Providers: IncredibleEmployment.be  This test is not yet approved or cleared by the [place] FDA and has been authorized for detection and/or diagnosis of SARS-CoV-2 by FDA under an Emergency Use Authorization (EUA). This EUA will remain in effect (meaning this test can be used) for the duration of the COVID-19 declaration under Section 564(b)(1) of the Act, 21 U.S.C. section 360bbb-3(b)(1), unless the authorization is terminated or revoked.     Resp Syncytial Virus by PCR NEGATIVE NEGATIVE Final    Comment: (NOTE) Fact Sheet for Patients: EntrepreneurPulse.com.au  Fact Sheet for Healthcare Providers: IncredibleEmployment.be  This test is not yet approved or cleared by the [place] FDA and has been authorized for detection and/or diagnosis of SARS-CoV-2 by FDA under an Emergency Use Authorization (EUA). This EUA will remain in effect (meaning this test can be used) for the duration of the COVID-19 declaration under Section 564(b)(1) of  the Act, 21 U.S.C. section 360bbb-3(b)(1), unless the authorization is terminated or revoked.  Performed at [facility], [place] [address], [place], [place] [address]   Culture, blood (Routine X 2) w Reflex to ID Panel     Status: None   Collection Time: 08/16/22  1:16 PM   Specimen: BLOOD  Result Value Ref Range Status   Specimen Description BLOOD SITE NOT SPECIFIED  Final   Special Requests   Final    BOTTLES DRAWN AEROBIC AND ANAEROBIC Blood Culture results may not be optimal due to an inadequate volume of blood received in culture bottles   Culture   Final    NO GROWTH 5 DAYS Performed at [facility], [place] [address] [place], [place] [address]    Report Status 08/21/2022 FINAL  Final  Culture, blood (Routine X 2) w Reflex to ID Panel     Status: None   Collection Time: 08/16/22  1:22 PM   Specimen: BLOOD  Result Value Ref Range Status   Specimen Description BLOOD SITE NOT SPECIFIED  Final   Special Requests   Final    BOTTLES DRAWN AEROBIC AND ANAEROBIC Blood Culture results may not be optimal due to an inadequate volume of blood received in culture bottles   Culture   Final    NO GROWTH 5 DAYS Performed at [facility], [place] [address] [place], [place] [address]    Report Status 08/21/2022 FINAL  Final         Radiology Studies: No results found.      Scheduled Meds:  atorvastatin  40 mg Oral Daily   Carbidopa-Levodopa ER  2 tablet Oral TID   digoxin  0.125 mg Oral Daily   diltiazem  60 mg Oral Q8H   melatonin  3 mg Oral QHS   Continuous Infusions:  sodium chloride     azithromycin 500 mg (08/20/22 1002)   levETIRAcetam 500 mg (08/20/22 2120)          Aline August, MD [facility] 08/21/2022, 8:19 AM

## 2022-08-21 NOTE — Progress Notes (Signed)
EEG complete - results pending 

## 2022-08-21 NOTE — Procedures (Signed)
Patient Name: NEELY CECENA  MRN: [id number]  Epilepsy Attending: Lora Havens  Referring Physician/Provider: Aline August, MD  Date: 08/21/2022 Duration: 24.23 mins  Patient history: 88yo M with ams. EEG to evaluate for seizure.  Level of alertness: Awake, asleep  AEDs during EEG study: LEV  Technical aspects: This EEG study was done with scalp electrodes positioned according to the 10-20 International system of electrode placement. Electrical activity was reviewed with band pass filter of 1-'70Hz'$ , sensitivity of 7 uV/mm, display speed of 77m/sec with a '60Hz'$  notched filter applied as appropriate. EEG data were recorded continuously and digitally stored.  Video monitoring was available and reviewed as appropriate.  Description: The posterior dominant rhythm consists of 8 Hz activity of moderate voltage (25-35 uV) seen predominantly in posterior head regions, symmetric and reactive to eye opening and eye closing. Sleep was characterized by vertex waves, sleep spindles (12 to 14 Hz), maximal frontocentral region. Hyperventilation and photic stimulation were not performed.     IMPRESSION: This study is within normal limits. No seizures or epileptiform discharges were seen throughout the recording.  Joaquin Knebel OBarbra Sarks

## 2022-08-22 DIAGNOSIS — S065XAA Traumatic subdural hemorrhage with loss of consciousness status unknown, initial encounter: Secondary | ICD-10-CM | POA: Diagnosis not present

## 2022-08-22 LAB — CBC WITH DIFFERENTIAL/PLATELET
Abs Immature Granulocytes: 0.03 10*3/uL (ref 0.00–0.07)
Basophils Absolute: 0 10*3/uL (ref 0.0–0.1)
Basophils Relative: 0 %
Eosinophils Absolute: 0.1 10*3/uL (ref 0.0–0.5)
Eosinophils Relative: 1 %
HCT: 34.1 % — ABNORMAL LOW (ref 39.0–52.0)
Hemoglobin: 10.7 g/dL — ABNORMAL LOW (ref 13.0–17.0)
Immature Granulocytes: 0 %
Lymphocytes Relative: 16 %
Lymphs Abs: 1.3 10*3/uL (ref 0.7–4.0)
MCH: 25.2 pg — ABNORMAL LOW (ref 26.0–34.0)
MCHC: 31.4 g/dL (ref 30.0–36.0)
MCV: 80.2 fL (ref 80.0–100.0)
Monocytes Absolute: 0.9 10*3/uL (ref 0.1–1.0)
Monocytes Relative: 12 %
Neutro Abs: 5.6 10*3/uL (ref 1.7–7.7)
Neutrophils Relative %: 71 %
Platelets: 284 10*3/uL (ref 150–400)
RBC: 4.25 MIL/uL (ref 4.22–5.81)
RDW: 16.8 % — ABNORMAL HIGH (ref 11.5–15.5)
WBC: 8 10*3/uL (ref 4.0–10.5)
nRBC: 0 % (ref 0.0–0.2)

## 2022-08-22 LAB — GLUCOSE, CAPILLARY
Glucose-Capillary: 122 mg/dL — ABNORMAL HIGH (ref 70–99)
Glucose-Capillary: 138 mg/dL — ABNORMAL HIGH (ref 70–99)
Glucose-Capillary: 116 mg/dL — ABNORMAL HIGH (ref 70–99)
Glucose-Capillary: 155 mg/dL — ABNORMAL HIGH (ref 70–99)
Glucose-Capillary: 192 mg/dL — ABNORMAL HIGH (ref 70–99)
Glucose-Capillary: 114 mg/dL — ABNORMAL HIGH (ref 70–99)

## 2022-08-22 LAB — COMPREHENSIVE METABOLIC PANEL
ALT: 27 U/L (ref 0–44)
AST: 30 U/L (ref 15–41)
Albumin: 3.1 g/dL — ABNORMAL LOW (ref 3.5–5.0)
Alkaline Phosphatase: 87 U/L (ref 38–126)
Anion gap: 8 (ref 5–15)
BUN: 11 mg/dL (ref 8–23)
CO2: 25 mmol/L (ref 22–32)
Calcium: 10 mg/dL (ref 8.9–10.3)
Chloride: 105 mmol/L (ref 98–111)
Creatinine, Ser: 0.92 mg/dL (ref 0.61–1.24)
GFR, Estimated: >60 mL/min (ref >60)
Glucose, Bld: 131 mg/dL — ABNORMAL HIGH (ref 70–99)
Potassium: 3.8 mmol/L (ref 3.5–5.1)
Sodium: 138 mmol/L (ref 135–145)
Total Bilirubin: 0.8 mg/dL (ref 0.3–1.2)
Total Protein: 6 g/dL — ABNORMAL LOW (ref 6.5–8.1)

## 2022-08-22 LAB — VITAMIN B12: Vitamin B-12: 1014 pg/mL — ABNORMAL HIGH (ref 180–914)

## 2022-08-22 LAB — AMMONIA: Ammonia: 17 umol/L (ref 9–35)

## 2022-08-22 LAB — TSH: TSH: 5.553 u[IU]/mL — ABNORMAL HIGH (ref 0.350–4.500)

## 2022-08-22 LAB — MAGNESIUM: Magnesium: 1.9 mg/dL (ref 1.7–2.4)

## 2022-08-22 MED ORDER — METOPROLOL TARTRATE 5 MG/5ML IV SOLN
2.5000 mg | Freq: Four times a day (QID) | INTRAVENOUS | Status: DC | PRN
  Administered 2022-08-23: 2.5 mg via INTRAVENOUS
  Filled 2022-08-22: qty 5

## 2022-08-22 NOTE — TOC CAGE-AID Note (Signed)
Transition of Care [facility]) - CAGE-AID Screening   Patient Details  Name: Adam Kane MRN: [id number] Date of Birth: 03/02/1933  Transition of Care [facility]) CM/SW Contact:    Army Melia, RN Phone Number:[phone] 08/22/2022, 12:36 AM   Clinical Narrative:  No hx of drug/alcohol use, no resources indicated.  CAGE-AID Screening:    Have You Ever Felt You Ought to Cut Down on Your Drinking or Drug Use?: No Have People Annoyed You By Critizing Your Drinking Or Drug Use?: No Have You Felt Bad Or Guilty About Your Drinking Or Drug Use?: No Have You Ever Had a Drink or Used Drugs First Thing In The Morning to Steady Your Nerves or to Get Rid of a Hangover?: No CAGE-AID Score: 0  Substance Abuse Education Offered: No

## 2022-08-22 NOTE — Progress Notes (Signed)
PROGRESS NOTE    Adam Kane  YJE:[id number] DOB: Aug 16, 1933 DOA: 08/14/2022 PCP: Crist Infante, MD   Brief Narrative:  88 year old male with history of atrial fibrillation on Eliquis, CAD status post CABG, hypertension, hyperlipidemia, Parkinson's disease, hospitalization for UTI last month presented after a fall and altered mental status.  On presentation, CT of the head showed subdural hematoma around the left hemisphere.  Patient was admitted to ICU service.  Neurosurgery was consulted and recommended conservative management.  Patient received adnexa.  Repeat CT head was stable.  He was started on Keppra.  He was transferred to [facility] service from 08/16/2022 onwards.  He subsequently had fever and was treated with antibiotics for possible UTI/left lower lobe pneumonia and has completed antibiotic treatment.  Assessment & Plan:   Subdural hematoma after a fall -Was on Eliquis prior to presentation: Received adnexxa -Neurosurgery recommended conservative management with follow-up CT head without contrast in 2 weeks.  He has been started on Keppra twice daily for 7 days.  Continue to hold Eliquis till reevaluation by neurosurgery as an outpatient -Patient had MRI of brain on 08/21/2022 because of daughter's concern of intermittent lethargy and confusion which showed left hemispheric subdural hematoma increasing in size with some midline shift.  I have reached out to neurosurgery regarding the same: Follow recommendations.  Daughter would like to hear from neurosurgery regarding the same and follow-up plans and is very nervous about holding Eliquis for 2 weeks. Awaiting Neurosurgery recommendations (reached out to Dr. Reatha Armour via secure chat who will let Dr. Marcello Moores know about the same)  Acute metabolic encephalopathy -Possibly from above.   -Repeat CT of the head done showed stable subdural hematoma.   -Mental status has improved over the last few days: Daughter thinks that patient is  intermittently confused and lethargic.  Subsequently MRI brain and EEG done on 08/21/2022.  EEG did not show any seizures.  Neurology was consulted who recommended to monitor mental status with no further interventions needed and neurology signed off on 08/21/2022. -Monitor mental status.   -PT/OT recommend SNF placement.  TOC following. -Patient has been started on diet as per SLP recommendations. -Empirically started on oral folic acid  Chronic atrial fibrillation -Currently intermittently tachycardic.  Continue oral digoxin and Cardizem.  Eliquis plan as above.    Fever Possible UTI Possible left lower lobe pneumonia -Completed 5-day course of Rocephin and Zithromax on 08/20/2022 -No temperature spikes over the last few days.   -Cultures negative so far  Hyperlipidemia -Continue statin  Hypertension -monitor blood pressure.  Blood pressure has much improved.  Continue Cardizem  Parkinson's disease -Continue carbidopa levodopa   Anemia of chronic disease -from chronic illnesses.  Goals of care -Palliative care following intermittently.  Hypokalemia -Improved  DVT prophylaxis: SCDs Code Status: DNR Family Communication: spoke to daughter on phone on 08/21/2022.  Spoke to son at bedside on 08/22/2022  disposition Plan: Status is: Inpatient Remains inpatient appropriate because: Need for SNF placement.   Consultants: PCCM/neurosurgery.  Palliative care  Procedures: None  Antimicrobials:  Anti-infectives (From admission, onward)    Start     Dose/Rate Route Frequency Ordered Stop   08/17/22 1115  azithromycin (ZITHROMAX) 500 mg in sodium chloride 0.9 % 250 mL IVPB  Status:  Discontinued        500 mg 250 mL/hr over 60 Minutes Intravenous Every 24 hours 08/17/22 1018 08/21/22 1146   08/16/22 1230  cefTRIAXone (ROCEPHIN) 2 g in sodium chloride 0.9 % 100 mL IVPB  Status:  Discontinued        2 g 200 mL/hr over 30 Minutes Intravenous Daily 08/16/22 1146 08/20/22 1431         Subjective: Patient seen and examined at bedside.  Poor historian.  No agitation, seizures, vomiting reported.   Objective: Vitals:   08/21/22 2314 08/22/22 0315 08/22/22 0500 08/22/22 0627  BP: (!) 135/95 (!) 141/96  (!) 143/85  Pulse: (!) 103 (!) 106    Resp: 18 19    Temp: 98.7 F (37.1 C) 99 F (37.2 C)    TempSrc: Oral Axillary    SpO2: 94% 94%    Weight:   103.8 kg     Intake/Output Summary (Last 24 hours) at 08/22/2022 0810 Last data filed at 08/22/2022 0316 Gross per 24 hour  Intake 1747.5 ml  Output 1750 ml  Net -2.5 ml    Filed Weights   08/20/22 0500 08/21/22 0347 08/22/22 0500  Weight: 101.5 kg 103.8 kg 103.8 kg    Examination:  General: Currently on room air and in no acute distress.  Looks chronically ill and deconditioned. ENT/neck: No palpable neck masses or JVD elevation noted  respiratory: Bilateral decreased breath sounds at bases with scattered crackles CVS: Tachycardic; S1 and S2 heard  abdominal: Soft, nontender, still slightly distended; no organomegaly, normal bowel sounds are heard  extremities: No clubbing; mild lower extremity edema present  CNS: Slow to respond, alert.  No focal neurologic deficit.  Moving extremities lymph: No obvious cervical lymphadenopathy noted skin: No ecchymosis/rashes  psych: No agitation currently.  Flat affect.   Musculoskeletal: No obvious joint deformity/tenderness    Data Reviewed: I have personally reviewed following labs and imaging studies  CBC: Recent Labs  Lab 08/16/22 0321 08/17/22 0325 08/19/22 0452 08/20/22 0410 08/22/22 0349  WBC 8.6 8.1 7.6 7.0 8.0  NEUTROABS  --  5.4 4.8 4.6 5.6  HGB 10.9* 9.8* 10.8* 10.1* 10.7*  HCT 34.8* 32.6* 35.3* 32.2* 34.1*  MCV 79.5* 81.1 81.5 80.3 80.2  PLT 215 200 221 242 035    Basic Metabolic Panel: Recent Labs  Lab 08/16/22 0321 08/17/22 0325 08/19/22 0452 08/20/22 0410 08/22/22 0349  NA 136 135 137 138 138  K 3.7 3.4* 3.5 3.4* 3.8  CL  104 105 104 106 105  CO2 '22 23 22 24 25  '$ GLUCOSE 141* 114* 108* 116* 131*  BUN '21 20 16 13 11  '$ CREATININE 1.01 0.95 0.90 0.87 0.92  CALCIUM 9.5 9.2 9.4 9.3 10.0  MG 1.9 1.8 1.7 1.7 1.9    GFR: Estimated Creatinine Clearance: 69.5 mL/min (by C-G formula based on SCr of 0.92 mg/dL). Liver Function Tests: Recent Labs  Lab 08/17/22 0325 08/22/22 0349  AST 19 30  ALT 19 27  ALKPHOS 73 87  BILITOT 0.8 0.8  PROT 5.5* 6.0*  ALBUMIN 2.9* 3.1*    No results for input(s): "LIPASE", "AMYLASE" in the last 168 hours. Recent Labs  Lab 08/22/22 0349  AMMONIA 17   Coagulation Profile: No results for input(s): "INR", "PROTIME" in the last 168 hours.  Cardiac Enzymes: No results for input(s): "CKTOTAL", "CKMB", "CKMBINDEX", "TROPONINI" in the last 168 hours. BNP (last 3 results) No results for input(s): "PROBNP" in the last 8760 hours. HbA1C: No results for input(s): "HGBA1C" in the last 72 hours. CBG: Recent Labs  Lab 08/21/22 1111 08/21/22 1544 08/21/22 1945 08/21/22 2329 08/22/22 0318  GLUCAP 147* 136* 140* 115* 122*    Lipid Profile: No results for input(s): "CHOL", "HDL", "  [place]", "TRIG", "CHOLHDL", "LDLDIRECT" in the last 72 hours. Thyroid Function Tests: Recent Labs    08/22/22 0349  TSH 5.553*   Anemia Panel: Recent Labs    08/22/22 0349  VITAMINB12 1,014*   Sepsis Labs: Recent Labs  Lab 08/18/22 0455 08/19/22 0705  PROCALCITON <0.10 <0.10     Recent Results (from the past 240 hour(s))  Resp panel by RT-PCR (RSV, Flu A&B, Covid) Anterior Nasal Swab     Status: None   Collection Time: 08/14/22  8:13 PM   Specimen: Anterior Nasal Swab  Result Value Ref Range Status   SARS Coronavirus 2 by RT PCR NEGATIVE NEGATIVE Final    Comment: (NOTE) SARS-CoV-2 target nucleic acids are NOT DETECTED.  The SARS-CoV-2 RNA is generally detectable in upper respiratory specimens during the acute phase of infection. The lowest concentration of SARS-CoV-2 viral  copies this assay can detect is 138 copies/mL. A negative result does not preclude SARS-Cov-2 infection and should not be used as the sole basis for treatment or other patient management decisions. A negative result may occur with  improper specimen collection/handling, submission of specimen other than nasopharyngeal swab, presence of viral mutation(s) within the areas targeted by this assay, and inadequate number of viral copies(<138 copies/mL). A negative result must be combined with clinical observations, patient history, and epidemiological information. The expected result is Negative.  Fact Sheet for Patients:  EntrepreneurPulse.com.au  Fact Sheet for Healthcare Providers:  IncredibleEmployment.be  This test is no t yet approved or cleared by the [place] FDA and  has been authorized for detection and/or diagnosis of SARS-CoV-2 by FDA under an Emergency Use Authorization (EUA). This EUA will remain  in effect (meaning this test can be used) for the duration of the COVID-19 declaration under Section 564(b)(1) of the Act, 21 U.S.C.section 360bbb-3(b)(1), unless the authorization is terminated  or revoked sooner.       Influenza A by PCR NEGATIVE NEGATIVE Final   Influenza B by PCR NEGATIVE NEGATIVE Final    Comment: (NOTE) The Xpert Xpress SARS-CoV-2/FLU/RSV plus assay is intended as an aid in the diagnosis of influenza from Nasopharyngeal swab specimens and should not be used as a sole basis for treatment. Nasal washings and aspirates are unacceptable for Xpert Xpress SARS-CoV-2/FLU/RSV testing.  Fact Sheet for Patients: EntrepreneurPulse.com.au  Fact Sheet for Healthcare Providers: IncredibleEmployment.be  This test is not yet approved or cleared by the [place] FDA and has been authorized for detection and/or diagnosis of SARS-CoV-2 by FDA under an Emergency Use Authorization (EUA). This  EUA will remain in effect (meaning this test can be used) for the duration of the COVID-19 declaration under Section 564(b)(1) of the Act, 21 U.S.C. section 360bbb-3(b)(1), unless the authorization is terminated or revoked.     Resp Syncytial Virus by PCR NEGATIVE NEGATIVE Final    Comment: (NOTE) Fact Sheet for Patients: EntrepreneurPulse.com.au  Fact Sheet for Healthcare Providers: IncredibleEmployment.be  This test is not yet approved or cleared by the [place] FDA and has been authorized for detection and/or diagnosis of SARS-CoV-2 by FDA under an Emergency Use Authorization (EUA). This EUA will remain in effect (meaning this test can be used) for the duration of the COVID-19 declaration under Section 564(b)(1) of the Act, 21 U.S.C. section 360bbb-3(b)(1), unless the authorization is terminated or revoked.  Performed at [facility], [place] [address] [place], [place] [address]   Culture, blood (Routine X 2) w Reflex to ID Panel     Status:  None   Collection Time: 08/16/22  1:16 PM   Specimen: BLOOD  Result Value Ref Range Status   Specimen Description BLOOD SITE NOT SPECIFIED  Final   Special Requests   Final    BOTTLES DRAWN AEROBIC AND ANAEROBIC Blood Culture results may not be optimal due to an inadequate volume of blood received in culture bottles   Culture   Final    NO GROWTH 5 DAYS Performed at [facility], [place] [address], [place], [place] [address]    Report Status 09/10/2022 FINAL  Final  Culture, blood (Routine X 2) w Reflex to ID Panel     Status: None   Collection Time: 08/16/22  1:22 PM   Specimen: BLOOD  Result Value Ref Range Status   Specimen Description BLOOD SITE NOT SPECIFIED  Final   Special Requests   Final    BOTTLES DRAWN AEROBIC AND ANAEROBIC Blood Culture results may not be optimal due to an inadequate volume of blood received in culture bottles   Culture   Final    NO GROWTH 5  DAYS Performed at [facility], [place] [address] [place], [place] [address]    Report Status September 10, 2022 FINAL  Final         Radiology Studies: EEG adult  Result Date: 09-10-22 Lora Havens, MD     September 10, 2022  4:40 PM Patient Name: Adam Kane MRN: [id number] Epilepsy Attending: Lora Havens Referring Physician/Provider: Aline August, MD Date: 10-Sep-2022 Duration: 24.23 mins Patient history: 88yo M with ams. EEG to evaluate for seizure. Level of alertness: Awake, asleep AEDs during EEG study: LEV Technical aspects: This EEG study was done with scalp electrodes positioned according to the 10-20 International system of electrode placement. Electrical activity was reviewed with band pass filter of 1-'70Hz'$ , sensitivity of 7 uV/mm, display speed of 61m/sec with a '60Hz'$  notched filter applied as appropriate. EEG data were recorded continuously and digitally stored.  Video monitoring was available and reviewed as appropriate. Description: The posterior dominant rhythm consists of 8 Hz activity of moderate voltage (25-35 uV) seen predominantly in posterior head regions, symmetric and reactive to eye opening and eye closing. Sleep was characterized by vertex waves, sleep spindles (12 to 14 Hz), maximal frontocentral region. Hyperventilation and photic stimulation were not performed.   IMPRESSION: This study is within normal limits. No seizures or epileptiform discharges were seen throughout the recording. PLora Havens  MR BRAIN WO CONTRAST  Result Date: 101-10-2024CLINICAL DATA:  Delirium.  History of fall with subdural hematoma EXAM: MRI HEAD WITHOUT CONTRAST TECHNIQUE: Multiplanar, multiecho pulse sequences of the brain and surrounding structures were obtained without intravenous contrast. COMPARISON:  CT head 08/15/2022 FINDINGS: Brain: Left hemispheric subdural hematoma measures 6 mm in the frontal lobe and 6.7 mm in the parietal lobe. Subdural contains methemoglobin due to  breakdown of blood products. Left frontal and parietal subdural hematoma is larger compared to the prior CT. Subdural along the tentorium unchanged. Posterior interhemispheric subdural hematoma slightly improved. Subdural hematoma inferior to the left temporal lobe now measures 12.5 mm with mild improvement. There is mass-effect on the left hemisphere with effacement of the sulci in the temporoparietal lobe. 3 mm midline shift to the right is similar to the prior study. Moderate atrophy.  Negative for acute infarct or mass. Vascular: Normal arterial flow voids Skull and upper cervical spine: No focal skeletal abnormality Sinuses/Orbits: Mild mucosal edema paranasal sinuses. Bilateral cataract extraction Other: None IMPRESSION: 1.  Left hemispheric subdural hematoma has increased in size compared with the prior CT. There is mass-effect on the left hemisphere and 3 mm midline shift to the right. 2. Subdural hematoma inferior to the left temporal lobe has mildly improved. Posterior interhemispheric subdural hematoma slightly improved. Left tentorial subdural hematoma unchanged. 3. Moderate atrophy. No acute infarct. Electronically Signed   By: Franchot Gallo M.D.   On: 08/21/2022 13:31        Scheduled Meds:  atorvastatin  40 mg Oral Daily   Carbidopa-Levodopa ER  2 tablet Oral TID   digoxin  0.125 mg Oral Daily   diltiazem  60 mg Oral W8E   folic acid  1 mg Oral Daily   melatonin  3 mg Oral QHS   Continuous Infusions:  sodium chloride     levETIRAcetam 500 mg (08/21/22 2221)          Aline August, MD [facility] 08/22/2022, 8:10 AM

## 2022-08-22 NOTE — Progress Notes (Signed)
Pt's hr seen to be as high as 150's when sitting at edge of bed. Pt repositioned and encouraged to relax. HR settled in 110's to 120's. Call from central tele that pt in Afib RVR. Dr. Selinda Orion was notified. Orders to give scheduled Digoxin now and PRN Metoprolol added as well.   Justice Rocher, RN

## 2022-08-22 NOTE — Progress Notes (Signed)
Physical Therapy Treatment Patient Details Name: Adam Kane MRN: [id number] DOB: 29-Jul-1933 Today's Date: 08/22/2022   History of Present Illness Pt is a 88 year old male who presented to the [facility] ER after a fall and altered mentation.CT showed subdural hematoma around the left hemisphere.Acute metabolic encephalopathy,Possible UTI  Possible left lower lobe pneumonia  PHMx:atrial fibrillation on eliquis, CAD s/p CABG, CKD, hypertension, hyperlidpidemia and parkinson's disease    PT Comments    Pt more flat and slow to respond today.  Emphasis on mobility in general, work on sit to stand safety and progression of gait stability/quality/speed, but pt very halted and slow to respond to cues and input.  Pt left in the chair.    Recommendations for follow up therapy are one component of a multi-disciplinary discharge planning process, led by the attending physician.  Recommendations may be updated based on patient status, additional functional criteria and insurance authorization.  Follow Up Recommendations  Skilled nursing-short term rehab (<3 hours/day) Can patient physically be transported by private vehicle: No   Assistance Recommended at Discharge Frequent or constant Supervision/Assistance  Patient can return home with the following A little help with walking and/or transfers;A little help with bathing/dressing/bathroom;Assistance with cooking/housework;Assist for transportation;Help with stairs or ramp for entrance   Equipment Recommendations  Other (comment) (TBD)    Recommendations for Other Services       Precautions / Restrictions Precautions Precautions: Fall Restrictions Weight Bearing Restrictions: No     Mobility  Bed Mobility Overal bed mobility: Needs Assistance Bed Mobility: Supine to Sit     Supine to sit: HOB elevated, Mod assist, Max assist     General bed mobility comments: VCs for sequencing, A for legs, trunk, and to scoot forward to EOB     Transfers Overall transfer level: Needs assistance Equipment used: Rolling walker (2 wheels) Transfers: Sit to/from Stand Sit to Stand: From elevated surface, Mod assist           General transfer comment: Once in standing, min assist to maintain standing holding to the IV pole    Ambulation/Gait Ambulation/Gait assistance: Mod assist   Assistive device: Rolling walker (2 wheels) Gait Pattern/deviations: Step-to pattern, Step-through pattern, Decreased step length - right, Decreased step length - left, Decreased stride length, Scissoring   Gait velocity interpretation: <1.31 ft/sec, indicative of household ambulator   General Gait Details: more halted, needing verbal rhytmical cues to try keeping pt moving. 2 trials of gait.   Stairs             Wheelchair Mobility    Modified Rankin (Stroke Patients Only)       Balance Overall balance assessment: Needs assistance Sitting-balance support: Bilateral upper extremity supported, Feet supported, No upper extremity supported Sitting balance-Leahy Scale: Poor Sitting balance - Comments: tendency for posteior lean. Postural control: Posterior lean Standing balance support: Bilateral upper extremity supported Standing balance-Leahy Scale: Poor                              Cognition Arousal/Alertness: Awake/alert Behavior During Therapy: WFL for tasks assessed/performed Overall Cognitive Status: Impaired/Different from baseline Area of Impairment: Orientation, Following commands, Safety/judgement, Awareness, Problem solving                 Orientation Level: Time (2003)     Following Commands: Follows one step commands with increased time Safety/Judgement: Decreased awareness of safety, Decreased awareness of deficits Awareness: Intellectual Problem Solving:  Difficulty sequencing, Requires verbal cues, Requires tactile cues          Exercises      General Comments General comments  (skin integrity, edema, etc.): vss      Pertinent Vitals/Pain Pain Assessment Pain Assessment: Faces Faces Pain Scale: No hurt Pain Intervention(s): Monitored during session    Home Living Family/patient expects to be discharged to:: Skilled nursing facility                   Additional Comments: Wife currently at [facility] for rehab--per RN in speaking with family, wife is should be coming home soon    Prior Function            PT Goals (current goals can now be found in the care plan section) Acute Rehab PT Goals Patient Stated Goal: pt did not participate with goals PT Goal Formulation: Patient unable to participate in goal setting Time For Goal Achievement: 09/03/22 Potential to Achieve Goals: Good Progress towards PT goals: Progressing toward goals    Frequency    Min 3X/week      PT Plan Current plan remains appropriate    Co-evaluation              AM-PAC PT "6 Clicks" Mobility   Outcome Measure  Help needed turning from your back to your side while in a flat bed without using bedrails?: A Lot Help needed moving from lying on your back to sitting on the side of a flat bed without using bedrails?: A Lot Help needed moving to and from a bed to a chair (including a wheelchair)?: A Lot Help needed standing up from a chair using your arms (e.g., wheelchair or bedside chair)?: A Lot Help needed to walk in hospital room?: A Lot Help needed climbing 3-5 steps with a railing? : A Lot 6 Click Score: 12    End of Session   Activity Tolerance: Patient tolerated treatment well Patient left: in chair;with call bell/phone within reach;with chair alarm set Nurse Communication: Mobility status PT Visit Diagnosis: Unsteadiness on feet (R26.81);Other abnormalities of gait and mobility (R26.89);Difficulty in walking, not elsewhere classified (R26.2)     Time: 3612-2449 PT Time Calculation (min) (ACUTE ONLY): 31 min  Charges:  $Gait Training: 8-22  mins $Therapeutic Activity: 8-22 mins                     08/22/2022  [name], PT Acute Rehabilitation Services [phone]  (office)   [name] 08/22/2022, 5:43 PM

## 2022-08-23 DIAGNOSIS — I10 Essential (primary) hypertension: Secondary | ICD-10-CM

## 2022-08-23 DIAGNOSIS — G20A1 Parkinson's disease without dyskinesia, without mention of fluctuations: Secondary | ICD-10-CM | POA: Diagnosis not present

## 2022-08-23 DIAGNOSIS — S065XAA Traumatic subdural hemorrhage with loss of consciousness status unknown, initial encounter: Secondary | ICD-10-CM | POA: Diagnosis not present

## 2022-08-23 LAB — BASIC METABOLIC PANEL
Anion gap: 9 (ref 5–15)
BUN: 14 mg/dL (ref 8–23)
CO2: 24 mmol/L (ref 22–32)
Calcium: 9.9 mg/dL (ref 8.9–10.3)
Chloride: 104 mmol/L (ref 98–111)
Creatinine, Ser: 0.85 mg/dL (ref 0.61–1.24)
GFR, Estimated: >60 mL/min (ref >60)
Glucose, Bld: 126 mg/dL — ABNORMAL HIGH (ref 70–99)
Potassium: 3.7 mmol/L (ref 3.5–5.1)
Sodium: 137 mmol/L (ref 135–145)

## 2022-08-23 LAB — CBC WITH DIFFERENTIAL/PLATELET
Abs Immature Granulocytes: 0.05 10*3/uL (ref 0.00–0.07)
Basophils Absolute: 0 10*3/uL (ref 0.0–0.1)
Basophils Relative: 0 %
Eosinophils Absolute: 0.1 10*3/uL (ref 0.0–0.5)
Eosinophils Relative: 1 %
HCT: 34 % — ABNORMAL LOW (ref 39.0–52.0)
Hemoglobin: 10.5 g/dL — ABNORMAL LOW (ref 13.0–17.0)
Immature Granulocytes: 1 %
Lymphocytes Relative: 21 %
Lymphs Abs: 1.8 10*3/uL (ref 0.7–4.0)
MCH: 25.1 pg — ABNORMAL LOW (ref 26.0–34.0)
MCHC: 30.9 g/dL (ref 30.0–36.0)
MCV: 81.1 fL (ref 80.0–100.0)
Monocytes Absolute: 1 10*3/uL (ref 0.1–1.0)
Monocytes Relative: 12 %
Neutro Abs: 5.4 10*3/uL (ref 1.7–7.7)
Neutrophils Relative %: 65 %
Platelets: 301 10*3/uL (ref 150–400)
RBC: 4.19 MIL/uL — ABNORMAL LOW (ref 4.22–5.81)
RDW: 17 % — ABNORMAL HIGH (ref 11.5–15.5)
WBC: 8.4 10*3/uL (ref 4.0–10.5)
nRBC: 0 % (ref 0.0–0.2)

## 2022-08-23 LAB — GLUCOSE, CAPILLARY
Glucose-Capillary: 122 mg/dL — ABNORMAL HIGH (ref 70–99)
Glucose-Capillary: 124 mg/dL — ABNORMAL HIGH (ref 70–99)

## 2022-08-23 LAB — MAGNESIUM: Magnesium: 1.9 mg/dL (ref 1.7–2.4)

## 2022-08-23 NOTE — Progress Notes (Signed)
PROGRESS NOTE    Adam Kane  IRJ:[id number] DOB: Feb 09, 1933 DOA: 08/14/2022 PCP: Crist Infante, MD   Brief Narrative:  88 year old male with history of atrial fibrillation on Eliquis, CAD status post CABG, hypertension, hyperlipidemia, Parkinson's disease, hospitalization for UTI last month presented after a fall and altered mental status.  On presentation, CT of the head showed subdural hematoma around the left hemisphere.  Patient was admitted to ICU service.  Neurosurgery was consulted and recommended conservative management.  Patient received adnexa.  Repeat CT head was stable.  He was started on Keppra.  He was transferred to [facility] service from 08/16/2022 onwards.  He subsequently had fever and was treated with antibiotics for possible UTI/left lower lobe pneumonia and has completed antibiotic treatment.  Assessment & Plan:   Subdural hematoma after a fall -Was on Eliquis prior to presentation: Received adnexxa -Neurosurgery recommended conservative management with follow-up CT head without contrast in 2 weeks.  Patient was given Keppra for 7 days.   Continue to hold Eliquis till reevaluation by neurosurgery as an outpatient -Patient had MRI of brain on 08/21/2022 because of daughter's concern of intermittent lethargy and confusion which showed left hemispheric subdural hematoma increasing in size with some midline shift.  Daughter wanted to talk to neurosurgery again.  Previous rounding MD reached out to neurosurgery yesterday.  Waiting on their input.   Patient seems to be stable from a neurological standpoint.  Acute metabolic encephalopathy -Possibly from above.   -Repeat CT of the head done showed stable subdural hematoma.   -Daughter thinks that patient is intermittently confused and lethargic.  Subsequently MRI brain and EEG done on 08/21/2022.  EEG did not show any seizures.  Neurology was consulted who recommended to monitor mental status with no further interventions needed  and neurology signed off on 08/21/2022. -PT/OT recommend SNF placement.  TOC following. -Patient has been started on diet as per SLP recommendations. -Empirically started on oral folic acid Mentation seems to be stable.  He is following commands.  Chronic atrial fibrillation -Currently intermittently tachycardic.  Continue oral digoxin and Cardizem.  Eliquis plan as above.    Fever/Possible UTI/Possible left lower lobe pneumonia -Completed 5-day course of Rocephin and Zithromax on 08/20/2022 -No temperature spikes over the last few days.   -Cultures negative so far  Hyperlipidemia -Continue statin  Essential hypertension Blood pressure is reasonably well-controlled.  Continue diltiazem.  Parkinson's disease -Continue carbidopa levodopa   Anemia of chronic disease -from chronic illnesses.  No evidence of overt bleeding.  Goals of care -Palliative care following intermittently.  Hypokalemia -Improved  DVT prophylaxis: SCDs Code Status: DNR Family Communication: No family at bedside.  Will update daughter later today. Disposition Plan: Skilled nursing facility is recommended.  Status is: Inpatient Remains inpatient appropriate because: Need for SNF placement.   Consultants:  PCCM. Neurosurgery.  Palliative care.  Neurology  Procedures: EEG  Antimicrobials:  Anti-infectives (From admission, onward)    Start     Dose/Rate Route Frequency Ordered Stop   08/17/22 1115  azithromycin (ZITHROMAX) 500 mg in sodium chloride 0.9 % 250 mL IVPB  Status:  Discontinued        500 mg 250 mL/hr over 60 Minutes Intravenous Every 24 hours 08/17/22 1018 08/21/22 1146   08/16/22 1230  cefTRIAXone (ROCEPHIN) 2 g in sodium chloride 0.9 % 100 mL IVPB  Status:  Discontinued        2 g 200 mL/hr over 30 Minutes Intravenous Daily 08/16/22 1146 08/20/22 1431  Subjective: Patient slow to respond but does follow commands.  Complains of headache.  Denies any shortness of  breath.   Objective: Vitals:   08/23/22 0305 08/23/22 0500 08/23/22 0537 08/23/22 0815  BP: (!) 147/87  138/89 (!) 122/93  Pulse: 94   (!) 113  Resp: 20   17  Temp: 97.9 F (36.6 C)   98 F (36.7 C)  TempSrc: Axillary   Axillary  SpO2: 92%   95%  Weight:  103.8 kg      Intake/Output Summary (Last 24 hours) at 08/23/2022 0914 Last data filed at 08/23/2022 0855 Gross per 24 hour  Intake 580 ml  Output 775 ml  Net -195 ml    Filed Weights   08/21/22 0347 08/22/22 0500 08/23/22 0500  Weight: 103.8 kg 103.8 kg 103.8 kg    Examination:   General appearance: Awake alert.  In no distress.  Slow to respond. Resp: Clear to auscultation bilaterally.  Normal effort Cardio: S1-S2 is normal regular.  No S3-S4.  No rubs murmurs or bruit GI: Abdomen is soft.  Nontender nondistended.  Bowel sounds are present normal.  No masses organomegaly Extremities: No edema.  Moving all of his extremities.  Physical deconditioning is noted. Neurologic:  No focal neurological deficits.       Data Reviewed: I have personally reviewed following labs and imaging studies  CBC: Recent Labs  Lab 08/17/22 0325 08/19/22 0452 08/20/22 0410 08/22/22 0349 08/23/22 0338  WBC 8.1 7.6 7.0 8.0 8.4  NEUTROABS 5.4 4.8 4.6 5.6 5.4  HGB 9.8* 10.8* 10.1* 10.7* 10.5*  HCT 32.6* 35.3* 32.2* 34.1* 34.0*  MCV 81.1 81.5 80.3 80.2 81.1  PLT 200 221 242 284 163    Basic Metabolic Panel: Recent Labs  Lab 08/17/22 0325 08/19/22 0452 08/20/22 0410 08/22/22 0349 08/23/22 0338  NA 135 137 138 138 137  K 3.4* 3.5 3.4* 3.8 3.7  CL 105 104 106 105 104  CO2 '23 22 24 25 24  '$ GLUCOSE 114* 108* 116* 131* 126*  BUN '20 16 13 11 14  '$ CREATININE 0.95 0.90 0.87 0.92 0.85  CALCIUM 9.2 9.4 9.3 10.0 9.9  MG 1.8 1.7 1.7 1.9 1.9    GFR: Estimated Creatinine Clearance: 75.2 mL/min (by C-G formula based on SCr of 0.85 mg/dL).  Liver Function Tests: Recent Labs  Lab 08/17/22 0325 08/22/22 0349  AST 19 30  ALT  19 27  ALKPHOS 73 87  BILITOT 0.8 0.8  PROT 5.5* 6.0*  ALBUMIN 2.9* 3.1*    Recent Labs  Lab 08/22/22 0349  AMMONIA 17    CBG: Recent Labs  Lab 08/22/22 1604 08/22/22 1942 08/22/22 2308 08/23/22 0327 08/23/22 0803  GLUCAP 155* 192* 114* 122* 124*    Thyroid Function Tests: Recent Labs    08/22/22 0349  TSH 5.553*    Anemia Panel: Recent Labs    08/22/22 0349  VITAMINB12 1,014*    Sepsis Labs: Recent Labs  Lab 08/18/22 0455 08/19/22 0705  PROCALCITON <0.10 <0.10     Recent Results (from the past 240 hour(s))  Resp panel by RT-PCR (RSV, Flu A&B, Covid) Anterior Nasal Swab     Status: None   Collection Time: 08/14/22  8:13 PM   Specimen: Anterior Nasal Swab  Result Value Ref Range Status   SARS Coronavirus 2 by RT PCR NEGATIVE NEGATIVE Final    Comment: (NOTE) SARS-CoV-2 target nucleic acids are NOT DETECTED.  The SARS-CoV-2 RNA is generally detectable in upper respiratory specimens during the  acute phase of infection. The lowest concentration of SARS-CoV-2 viral copies this assay can detect is 138 copies/mL. A negative result does not preclude SARS-Cov-2 infection and should not be used as the sole basis for treatment or other patient management decisions. A negative result may occur with  improper specimen collection/handling, submission of specimen other than nasopharyngeal swab, presence of viral mutation(s) within the areas targeted by this assay, and inadequate number of viral copies(<138 copies/mL). A negative result must be combined with clinical observations, patient history, and epidemiological information. The expected result is Negative.  Fact Sheet for Patients:  EntrepreneurPulse.com.au  Fact Sheet for Healthcare Providers:  IncredibleEmployment.be  This test is no t yet approved or cleared by the [place] FDA and  has been authorized for detection and/or diagnosis of SARS-CoV-2 by FDA  under an Emergency Use Authorization (EUA). This EUA will remain  in effect (meaning this test can be used) for the duration of the COVID-19 declaration under Section 564(b)(1) of the Act, 21 U.S.C.section 360bbb-3(b)(1), unless the authorization is terminated  or revoked sooner.       Influenza A by PCR NEGATIVE NEGATIVE Final   Influenza B by PCR NEGATIVE NEGATIVE Final    Comment: (NOTE) The Xpert Xpress SARS-CoV-2/FLU/RSV plus assay is intended as an aid in the diagnosis of influenza from Nasopharyngeal swab specimens and should not be used as a sole basis for treatment. Nasal washings and aspirates are unacceptable for Xpert Xpress SARS-CoV-2/FLU/RSV testing.  Fact Sheet for Patients: EntrepreneurPulse.com.au  Fact Sheet for Healthcare Providers: IncredibleEmployment.be  This test is not yet approved or cleared by the [place] FDA and has been authorized for detection and/or diagnosis of SARS-CoV-2 by FDA under an Emergency Use Authorization (EUA). This EUA will remain in effect (meaning this test can be used) for the duration of the COVID-19 declaration under Section 564(b)(1) of the Act, 21 U.S.C. section 360bbb-3(b)(1), unless the authorization is terminated or revoked.     Resp Syncytial Virus by PCR NEGATIVE NEGATIVE Final    Comment: (NOTE) Fact Sheet for Patients: EntrepreneurPulse.com.au  Fact Sheet for Healthcare Providers: IncredibleEmployment.be  This test is not yet approved or cleared by the [place] FDA and has been authorized for detection and/or diagnosis of SARS-CoV-2 by FDA under an Emergency Use Authorization (EUA). This EUA will remain in effect (meaning this test can be used) for the duration of the COVID-19 declaration under Section 564(b)(1) of the Act, 21 U.S.C. section 360bbb-3(b)(1), unless the authorization is terminated or revoked.  Performed at [facility], [place] [address], [place], [place] [address]   Culture, blood (Routine X 2) w Reflex to ID Panel     Status: None   Collection Time: 08/16/22  1:16 PM   Specimen: BLOOD  Result Value Ref Range Status   Specimen Description BLOOD SITE NOT SPECIFIED  Final   Special Requests   Final    BOTTLES DRAWN AEROBIC AND ANAEROBIC Blood Culture results may not be optimal due to an inadequate volume of blood received in culture bottles   Culture   Final    NO GROWTH 5 DAYS Performed at [facility], [place] [address] [place], [place] [address]    Report Status 08/21/2022 FINAL  Final  Culture, blood (Routine X 2) w Reflex to ID Panel     Status: None   Collection Time: 08/16/22  1:22 PM   Specimen: BLOOD  Result Value Ref Range Status   Specimen Description BLOOD  SITE NOT SPECIFIED  Final   Special Requests   Final    BOTTLES DRAWN AEROBIC AND ANAEROBIC Blood Culture results may not be optimal due to an inadequate volume of blood received in culture bottles   Culture   Final    NO GROWTH 5 DAYS Performed at [facility], [address] [place], [place] [address]    Report Status 09/02/22 FINAL  Final         Radiology Studies: EEG adult  Result Date: September 02, 2022 Lora Havens, MD     02-Sep-2022  4:40 PM Patient Name: Adam Kane MRN: [id number] Epilepsy Attending: Lora Havens Referring Physician/Provider: Aline August, MD Date: 09/02/22 Duration: 24.23 mins Patient history: 88yo M with ams. EEG to evaluate for seizure. Level of alertness: Awake, asleep AEDs during EEG study: LEV Technical aspects: This EEG study was done with scalp electrodes positioned according to the 10-20 International system of electrode placement. Electrical activity was reviewed with band pass filter of 1-'70Hz'$ , sensitivity of 7 uV/mm, display speed of 84m/sec with a '60Hz'$  notched filter applied as appropriate. EEG data were recorded continuously and digitally stored.   Video monitoring was available and reviewed as appropriate. Description: The posterior dominant rhythm consists of 8 Hz activity of moderate voltage (25-35 uV) seen predominantly in posterior head regions, symmetric and reactive to eye opening and eye closing. Sleep was characterized by vertex waves, sleep spindles (12 to 14 Hz), maximal frontocentral region. Hyperventilation and photic stimulation were not performed.   IMPRESSION: This study is within normal limits. No seizures or epileptiform discharges were seen throughout the recording. PLora Havens  MR BRAIN WO CONTRAST  Result Date: 101/02/24CLINICAL DATA:  Delirium.  History of fall with subdural hematoma EXAM: MRI HEAD WITHOUT CONTRAST TECHNIQUE: Multiplanar, multiecho pulse sequences of the brain and surrounding structures were obtained without intravenous contrast. COMPARISON:  CT head 08/15/2022 FINDINGS: Brain: Left hemispheric subdural hematoma measures 6 mm in the frontal lobe and 6.7 mm in the parietal lobe. Subdural contains methemoglobin due to breakdown of blood products. Left frontal and parietal subdural hematoma is larger compared to the prior CT. Subdural along the tentorium unchanged. Posterior interhemispheric subdural hematoma slightly improved. Subdural hematoma inferior to the left temporal lobe now measures 12.5 mm with mild improvement. There is mass-effect on the left hemisphere with effacement of the sulci in the temporoparietal lobe. 3 mm midline shift to the right is similar to the prior study. Moderate atrophy.  Negative for acute infarct or mass. Vascular: Normal arterial flow voids Skull and upper cervical spine: No focal skeletal abnormality Sinuses/Orbits: Mild mucosal edema paranasal sinuses. Bilateral cataract extraction Other: None IMPRESSION: 1. Left hemispheric subdural hematoma has increased in size compared with the prior CT. There is mass-effect on the left hemisphere and 3 mm midline shift to the right. 2.  Subdural hematoma inferior to the left temporal lobe has mildly improved. Posterior interhemispheric subdural hematoma slightly improved. Left tentorial subdural hematoma unchanged. 3. Moderate atrophy. No acute infarct. Electronically Signed   By: CFranchot GalloM.D.   On: 101-02-202413:31        Scheduled Meds:  atorvastatin  40 mg Oral Daily   Carbidopa-Levodopa ER  2 tablet Oral TID   digoxin  0.125 mg Oral Daily   diltiazem  60 mg Oral QY1E  folic acid  1 mg Oral Daily   melatonin  3 mg Oral QHS   Continuous Infusions:  sodium chloride  Bonnielee Haff, MD [facility] 08/23/2022, 9:14 AM

## 2022-08-24 DIAGNOSIS — G20A1 Parkinson's disease without dyskinesia, without mention of fluctuations: Secondary | ICD-10-CM | POA: Diagnosis not present

## 2022-08-24 DIAGNOSIS — S065XAA Traumatic subdural hemorrhage with loss of consciousness status unknown, initial encounter: Secondary | ICD-10-CM | POA: Diagnosis not present

## 2022-08-24 DIAGNOSIS — I10 Essential (primary) hypertension: Secondary | ICD-10-CM | POA: Diagnosis not present

## 2022-08-24 NOTE — Progress Notes (Signed)
PROGRESS NOTE    Adam Kane  XBJ:[id number] DOB: Aug 02, 1933 DOA: 08/14/2022 PCP: Crist Infante, MD   Brief Narrative:  88 year old male with history of atrial fibrillation on Eliquis, CAD status post CABG, hypertension, hyperlipidemia, Parkinson's disease, hospitalization for UTI last month presented after a fall and altered mental status.  On presentation, CT of the head showed subdural hematoma around the left hemisphere.  Patient was admitted to ICU service.  Neurosurgery was consulted and recommended conservative management.  Patient received adnexa.  Repeat CT head was stable.  He was started on Keppra.  He was transferred to [facility] service from 08/16/2022 onwards.  He subsequently had fever and was treated with antibiotics for possible UTI/left lower lobe pneumonia and has completed antibiotic treatment.  Assessment & Plan:   Subdural hematoma after a fall -Was on Eliquis prior to presentation: Received adnexxa -Neurosurgery recommended conservative management with follow-up CT head without contrast in 2 weeks.  Patient was given Keppra for 7 days.   Continue to hold Eliquis till reevaluation by neurosurgery as an outpatient -Patient had MRI of brain on 08/21/2022 because of daughter's concern of intermittent lethargy and confusion which showed left hemispheric subdural hematoma increasing in size with some midline shift.   Daughter wanted to talk to neurosurgery again.  This was discussed with Dr. Reatha Armour on 12/22 and with Dr. Annette Stable on 12/23.   Patient seems to be stable from a neurological standpoint.  Acute metabolic encephalopathy -Possibly from above.   -Repeat CT of the head done showed stable subdural hematoma.   -Daughter thinks that patient is intermittently confused and lethargic.  Subsequently MRI brain and EEG done on 08/21/2022.  EEG did not show any seizures.  Neurology was consulted who recommended to monitor mental status with no further interventions needed and  neurology signed off on 08/21/2022. -PT/OT recommend SNF placement.  TOC following. -Patient has been started on diet as per SLP recommendations. -Empirically started on oral folic acid Mentation seems to be gradually improving.  Chronic atrial fibrillation Currently intermittently tachycardic.  Continue oral digoxin and Cardizem.  Eliquis plan as above.    Fever/Possible UTI/Possible left lower lobe pneumonia -Completed 5-day course of Rocephin and Zithromax on 08/20/2022 -No temperature spikes over the last few days.   -Cultures negative so far  Hyperlipidemia -Continue statin  Essential hypertension Blood pressure is reasonably well-controlled.  Continue diltiazem.  Parkinson's disease -Continue carbidopa levodopa   Anemia of chronic disease -from chronic illnesses.  No evidence of overt bleeding.  Goals of care -Palliative care following intermittently.  Hypokalemia -Improved  DVT prophylaxis: SCDs Code Status: DNR Family Communication: No family at bedside.   Disposition Plan: Skilled nursing facility is recommended.  Status is: Inpatient Remains inpatient appropriate because: Need for SNF placement.   Consultants:  PCCM. Neurosurgery.  Palliative care.  Neurology  Procedures: EEG  Antimicrobials:  Anti-infectives (From admission, onward)    Start     Dose/Rate Route Frequency Ordered Stop   08/17/22 1115  azithromycin (ZITHROMAX) 500 mg in sodium chloride 0.9 % 250 mL IVPB  Status:  Discontinued        500 mg 250 mL/hr over 60 Minutes Intravenous Every 24 hours 08/17/22 1018 08/21/22 1146   08/16/22 1230  cefTRIAXone (ROCEPHIN) 2 g in sodium chloride 0.9 % 100 mL IVPB  Status:  Discontinued        2 g 200 mL/hr over 30 Minutes Intravenous Daily 08/16/22 1146 08/20/22 1431        Subjective:  Patient slow to respond to questions but denies any complaints.   Objective: Vitals:   08/23/22 2315 08/24/22 0314 08/24/22 0500 08/24/22 0526  BP: 139/86  124/82  128/77  Pulse: 96 100    Resp: 19 18    Temp: 98.8 F (37.1 C) 98 F (36.7 C)    TempSrc: Oral Oral    SpO2: 95% 95%    Weight:   103.8 kg     Intake/Output Summary (Last 24 hours) at 08/24/2022 1042 Last data filed at 08/24/2022 4132 Gross per 24 hour  Intake 480 ml  Output 1550 ml  Net -1070 ml    Filed Weights   08/22/22 0500 08/23/22 0500 08/24/22 0500  Weight: 103.8 kg 103.8 kg 103.8 kg    Examination:  General appearance: Awake alert.  In no distress Resp: Clear to auscultation bilaterally.  Normal effort Cardio: S1-S2 is normal regular.  No S3-S4.  No rubs murmurs or bruit GI: Abdomen is soft.  Nontender nondistended.  Bowel sounds are present normal.  No masses organomegaly Extremities: No edema.       Data Reviewed: I have personally reviewed following labs and imaging studies  CBC: Recent Labs  Lab 08/19/22 0452 08/20/22 0410 08/22/22 0349 08/23/22 0338  WBC 7.6 7.0 8.0 8.4  NEUTROABS 4.8 4.6 5.6 5.4  HGB 10.8* 10.1* 10.7* 10.5*  HCT 35.3* 32.2* 34.1* 34.0*  MCV 81.5 80.3 80.2 81.1  PLT 221 242 284 440    Basic Metabolic Panel: Recent Labs  Lab 08/19/22 0452 08/20/22 0410 08/22/22 0349 08/23/22 0338  NA 137 138 138 137  K 3.5 3.4* 3.8 3.7  CL 104 106 105 104  CO2 '22 24 25 24  '$ GLUCOSE 108* 116* 131* 126*  BUN '16 13 11 14  '$ CREATININE 0.90 0.87 0.92 0.85  CALCIUM 9.4 9.3 10.0 9.9  MG 1.7 1.7 1.9 1.9    GFR: Estimated Creatinine Clearance: 75.2 mL/min (by C-G formula based on SCr of 0.85 mg/dL).  Liver Function Tests: Recent Labs  Lab 08/22/22 0349  AST 30  ALT 27  ALKPHOS 87  BILITOT 0.8  PROT 6.0*  ALBUMIN 3.1*    Recent Labs  Lab 08/22/22 0349  AMMONIA 17    CBG: Recent Labs  Lab 08/22/22 1604 08/22/22 1942 08/22/22 2308 08/23/22 0327 08/23/22 0803  GLUCAP 155* 192* 114* 122* 124*    Thyroid Function Tests: Recent Labs    08/22/22 0349  TSH 5.553*    Anemia Panel: Recent Labs     08/22/22 0349  VITAMINB12 1,014*    Sepsis Labs: Recent Labs  Lab 08/18/22 0455 08/19/22 0705  PROCALCITON <0.10 <0.10     Recent Results (from the past 240 hour(s))  Resp panel by RT-PCR (RSV, Flu A&B, Covid) Anterior Nasal Swab     Status: None   Collection Time: 08/14/22  8:13 PM   Specimen: Anterior Nasal Swab  Result Value Ref Range Status   SARS Coronavirus 2 by RT PCR NEGATIVE NEGATIVE Final    Comment: (NOTE) SARS-CoV-2 target nucleic acids are NOT DETECTED.  The SARS-CoV-2 RNA is generally detectable in upper respiratory specimens during the acute phase of infection. The lowest concentration of SARS-CoV-2 viral copies this assay can detect is 138 copies/mL. A negative result does not preclude SARS-Cov-2 infection and should not be used as the sole basis for treatment or other patient management decisions. A negative result may occur with  improper specimen collection/handling, submission of specimen other than nasopharyngeal swab, presence of  viral mutation(s) within the areas targeted by this assay, and inadequate number of viral copies(<138 copies/mL). A negative result must be combined with clinical observations, patient history, and epidemiological information. The expected result is Negative.  Fact Sheet for Patients:  EntrepreneurPulse.com.au  Fact Sheet for Healthcare Providers:  IncredibleEmployment.be  This test is no t yet approved or cleared by the [place] FDA and  has been authorized for detection and/or diagnosis of SARS-CoV-2 by FDA under an Emergency Use Authorization (EUA). This EUA will remain  in effect (meaning this test can be used) for the duration of the COVID-19 declaration under Section 564(b)(1) of the Act, 21 U.S.C.section 360bbb-3(b)(1), unless the authorization is terminated  or revoked sooner.       Influenza A by PCR NEGATIVE NEGATIVE Final   Influenza B by PCR NEGATIVE NEGATIVE Final     Comment: (NOTE) The Xpert Xpress SARS-CoV-2/FLU/RSV plus assay is intended as an aid in the diagnosis of influenza from Nasopharyngeal swab specimens and should not be used as a sole basis for treatment. Nasal washings and aspirates are unacceptable for Xpert Xpress SARS-CoV-2/FLU/RSV testing.  Fact Sheet for Patients: EntrepreneurPulse.com.au  Fact Sheet for Healthcare Providers: IncredibleEmployment.be  This test is not yet approved or cleared by the [place] FDA and has been authorized for detection and/or diagnosis of SARS-CoV-2 by FDA under an Emergency Use Authorization (EUA). This EUA will remain in effect (meaning this test can be used) for the duration of the COVID-19 declaration under Section 564(b)(1) of the Act, 21 U.S.C. section 360bbb-3(b)(1), unless the authorization is terminated or revoked.     Resp Syncytial Virus by PCR NEGATIVE NEGATIVE Final    Comment: (NOTE) Fact Sheet for Patients: EntrepreneurPulse.com.au  Fact Sheet for Healthcare Providers: IncredibleEmployment.be  This test is not yet approved or cleared by the [place] FDA and has been authorized for detection and/or diagnosis of SARS-CoV-2 by FDA under an Emergency Use Authorization (EUA). This EUA will remain in effect (meaning this test can be used) for the duration of the COVID-19 declaration under Section 564(b)(1) of the Act, 21 U.S.C. section 360bbb-3(b)(1), unless the authorization is terminated or revoked.  Performed at [facility], [place] [address] [place], [place] [address]   Culture, blood (Routine X 2) w Reflex to ID Panel     Status: None   Collection Time: 08/16/22  1:16 PM   Specimen: BLOOD  Result Value Ref Range Status   Specimen Description BLOOD SITE NOT SPECIFIED  Final   Special Requests   Final    BOTTLES DRAWN AEROBIC AND ANAEROBIC Blood Culture results may not be  optimal due to an inadequate volume of blood received in culture bottles   Culture   Final    NO GROWTH 5 DAYS Performed at [facility], [place] [address], [place], [place] [address]    Report Status 08/21/2022 FINAL  Final  Culture, blood (Routine X 2) w Reflex to ID Panel     Status: None   Collection Time: 08/16/22  1:22 PM   Specimen: BLOOD  Result Value Ref Range Status   Specimen Description BLOOD SITE NOT SPECIFIED  Final   Special Requests   Final    BOTTLES DRAWN AEROBIC AND ANAEROBIC Blood Culture results may not be optimal due to an inadequate volume of blood received in culture bottles   Culture   Final    NO GROWTH 5 DAYS Performed at [facility], [place] [address]  [place], [place] [address]    Report Status 08/21/2022 FINAL  Final         Radiology Studies: No results found.      Scheduled Meds:  atorvastatin  40 mg Oral Daily   Carbidopa-Levodopa ER  2 tablet Oral TID   digoxin  0.125 mg Oral Daily   diltiazem  60 mg Oral P6P   folic acid  1 mg Oral Daily   melatonin  3 mg Oral QHS   Continuous Infusions:  sodium chloride       Bonnielee Haff, MD [facility] 08/24/2022, 10:42 AM

## 2022-08-25 DIAGNOSIS — G9341 Metabolic encephalopathy: Secondary | ICD-10-CM | POA: Diagnosis not present

## 2022-08-25 DIAGNOSIS — I10 Essential (primary) hypertension: Secondary | ICD-10-CM | POA: Diagnosis not present

## 2022-08-25 DIAGNOSIS — G20A1 Parkinson's disease without dyskinesia, without mention of fluctuations: Secondary | ICD-10-CM | POA: Diagnosis not present

## 2022-08-25 DIAGNOSIS — S065XAA Traumatic subdural hemorrhage with loss of consciousness status unknown, initial encounter: Secondary | ICD-10-CM | POA: Diagnosis not present

## 2022-08-25 MED ORDER — POLYETHYLENE GLYCOL 3350 17 G PO PACK
17.0000 g | PACK | Freq: Every day | ORAL | Status: DC
  Administered 2022-08-25 – 2022-08-27 (×2): 17 g via ORAL
  Filled 2022-08-25 (×2): qty 1

## 2022-08-25 MED ORDER — VITAMIN B-12 1000 MCG PO TABS
1000.0000 ug | ORAL_TABLET | Freq: Every evening | ORAL | Status: DC
  Administered 2022-08-25 – 2022-08-26 (×2): 1000 ug via ORAL
  Filled 2022-08-25 (×3): qty 1

## 2022-08-25 MED ORDER — PANTOPRAZOLE SODIUM 40 MG PO TBEC
40.0000 mg | DELAYED_RELEASE_TABLET | Freq: Every day | ORAL | Status: DC
  Administered 2022-08-25 – 2022-08-27 (×3): 40 mg via ORAL
  Filled 2022-08-25 (×3): qty 1

## 2022-08-25 MED ORDER — EZETIMIBE 10 MG PO TABS
10.0000 mg | ORAL_TABLET | Freq: Every morning | ORAL | Status: DC
  Administered 2022-08-25 – 2022-08-27 (×3): 10 mg via ORAL
  Filled 2022-08-25 (×3): qty 1

## 2022-08-25 MED ORDER — DOCUSATE SODIUM 100 MG PO CAPS
100.0000 mg | ORAL_CAPSULE | Freq: Two times a day (BID) | ORAL | Status: DC
  Administered 2022-08-25 – 2022-08-27 (×5): 100 mg via ORAL
  Filled 2022-08-25 (×5): qty 1

## 2022-08-25 MED ORDER — VITAMIN D3 25 MCG (1000 UNIT) PO TABS
2000.0000 [IU] | ORAL_TABLET | Freq: Every evening | ORAL | Status: DC
  Administered 2022-08-25 – 2022-08-26 (×2): 2000 [IU] via ORAL
  Filled 2022-08-25 (×6): qty 2

## 2022-08-25 MED ORDER — QUETIAPINE FUMARATE 50 MG PO TABS
25.0000 mg | ORAL_TABLET | Freq: Two times a day (BID) | ORAL | Status: DC
  Administered 2022-08-25 – 2022-08-27 (×5): 25 mg via ORAL
  Filled 2022-08-25 (×5): qty 1

## 2022-08-25 NOTE — Progress Notes (Signed)
PROGRESS NOTE    Adam Kane  WCB:[id number] DOB: October 24, 1932 DOA: 08/14/2022 PCP: Crist Infante, MD   Brief Narrative:  88 year old male with history of atrial fibrillation on Eliquis, CAD status post CABG, hypertension, hyperlipidemia, Parkinson's disease, hospitalization for UTI last month presented after a fall and altered mental status.  On presentation, CT of the head showed subdural hematoma around the left hemisphere.  Patient was admitted to ICU service.  Neurosurgery was consulted and recommended conservative management.  Patient received adnexa.  Repeat CT head was stable.  He was started on Keppra.  He was transferred to [facility] service from 08/16/2022 onwards.  He subsequently had fever and was treated with antibiotics for possible UTI/left lower lobe pneumonia and has completed antibiotic treatment.  Assessment & Plan:   Subdural hematoma after a fall -Was on Eliquis prior to presentation: Received adnexxa -Neurosurgery recommended conservative management with follow-up CT head without contrast in 2 weeks.  Patient was given Keppra for 7 days.   Continue to hold Eliquis till reevaluation by neurosurgery as an outpatient -Patient had MRI of brain on 08/21/2022 because of daughter's concern of intermittent lethargy and confusion which showed left hemispheric subdural hematoma increasing in size with some midline shift.   Daughter wanted to talk to neurosurgery again.  This was discussed with Dr. Reatha Armour on 12/22 and with Dr. Annette Stable on 12/23.   Discussed with daughter yesterday and she did not have any further questions regarding this.  Patient is stable from a neurological standpoint. He will need to follow-up with neurosurgery in 2 weeks from 12/15.    Acute metabolic encephalopathy -Possibly from above.   -Repeat CT of the head done showed stable subdural hematoma.   -Patient was noted to be intermittently confused and lethargic.  Subsequently MRI brain and EEG done on 08/21/2022.   EEG did not show any seizures.  Neurology was consulted who recommended to monitor mental status with no further interventions needed and neurology signed off on 08/21/2022. -PT/OT recommend SNF placement.  TOC following. -Patient has been started on diet as per SLP recommendations. -Empirically started on oral folic acid Mentation improved for the most part.  Occasional confusion is noted.  Perhaps some sundowning.  Will start low-dose Seroquel.  Hopefully his mittens can be removed today.  Chronic atrial fibrillation Currently intermittently tachycardic.  Continue oral digoxin and Cardizem.  Eliquis to be resumed only after clearance from neurosurgery.    Fever/Possible UTI/Possible left lower lobe pneumonia -Completed 5-day course of Rocephin and Zithromax on 08/20/2022 -Cultures negative so far Remains afebrile.  Hyperlipidemia -Continue statin  Essential hypertension Blood pressure is reasonably well-controlled.  Continue diltiazem.  Parkinson's disease Continue carbidopa levodopa   Anemia of chronic disease No evidence of overt bleeding.  Goals of care Palliative care following intermittently.  Hypokalemia Improved.  Recheck labs tomorrow.  DVT prophylaxis: SCDs Code Status: DNR Family Communication: No family at bedside.  Discussed with daughter yesterday. Disposition Plan: Hopefully to SNF in 24 hours.  Status is: Inpatient Remains inpatient appropriate because: Need for SNF placement.   Consultants:  PCCM. Neurosurgery.  Palliative care.  Neurology  Procedures: EEG  Antimicrobials:  Anti-infectives (From admission, onward)    Start     Dose/Rate Route Frequency Ordered Stop   08/17/22 1115  azithromycin (ZITHROMAX) 500 mg in sodium chloride 0.9 % 250 mL IVPB  Status:  Discontinued        500 mg 250 mL/hr over 60 Minutes Intravenous Every 24 hours 08/17/22 1018 08/21/22  1146   08/16/22 1230  cefTRIAXone (ROCEPHIN) 2 g in sodium chloride 0.9 % 100 mL IVPB   Status:  Discontinued        2 g 200 mL/hr over 30 Minutes Intravenous Daily 08/16/22 1146 08/20/22 1431        Subjective: Noted to have mittens on this morning.  Slow to respond to questions.  Follows commands.  Denies any complaints.   Objective: Vitals:   08/25/22 0257 08/25/22 0412 08/25/22 0518 08/25/22 0800  BP: (!) 152/80  122/78 (!) 138/97  Pulse: (!) 104     Resp:    (!) 25  Temp: 98 F (36.7 C)   97.8 F (36.6 C)  TempSrc: Oral   Oral  SpO2: 95%   94%  Weight:  103.8 kg      Intake/Output Summary (Last 24 hours) at 08/25/2022 0912 Last data filed at 08/24/2022 1700 Gross per 24 hour  Intake 480 ml  Output 1050 ml  Net -570 ml    Filed Weights   08/23/22 0500 08/24/22 0500 08/25/22 0412  Weight: 103.8 kg 103.8 kg 103.8 kg    Examination:  General appearance: Awake alert.  In no distress Resp: Clear to auscultation bilaterally.  Normal effort Cardio: S1-S2 is normal regular.  No S3-S4.  No rubs murmurs or bruit GI: Abdomen is soft.  Nontender nondistended.  Bowel sounds are present normal.  No masses organomegaly Extremities: No edema.  Moving all of his extremities     Data Reviewed: I have personally reviewed following labs and imaging studies  CBC: Recent Labs  Lab 08/19/22 0452 08/20/22 0410 08/22/22 0349 08/23/22 0338  WBC 7.6 7.0 8.0 8.4  NEUTROABS 4.8 4.6 5.6 5.4  HGB 10.8* 10.1* 10.7* 10.5*  HCT 35.3* 32.2* 34.1* 34.0*  MCV 81.5 80.3 80.2 81.1  PLT 221 242 284 749    Basic Metabolic Panel: Recent Labs  Lab 08/19/22 0452 08/20/22 0410 08/22/22 0349 08/23/22 0338  NA 137 138 138 137  K 3.5 3.4* 3.8 3.7  CL 104 106 105 104  CO2 '22 24 25 24  '$ GLUCOSE 108* 116* 131* 126*  BUN '16 13 11 14  '$ CREATININE 0.90 0.87 0.92 0.85  CALCIUM 9.4 9.3 10.0 9.9  MG 1.7 1.7 1.9 1.9    GFR: Estimated Creatinine Clearance: 75.2 mL/min (by C-G formula based on SCr of 0.85 mg/dL).  Liver Function Tests: Recent Labs  Lab 08/22/22 0349   AST 30  ALT 27  ALKPHOS 87  BILITOT 0.8  PROT 6.0*  ALBUMIN 3.1*    Recent Labs  Lab 08/22/22 0349  AMMONIA 17    CBG: Recent Labs  Lab 08/22/22 1604 08/22/22 1942 08/22/22 2308 08/23/22 0327 08/23/22 0803  GLUCAP 155* 192* 114* 122* 124*      Sepsis Labs: Recent Labs  Lab 08/19/22 0705  PROCALCITON <0.10     Recent Results (from the past 240 hour(s))  Culture, blood (Routine X 2) w Reflex to ID Panel     Status: None   Collection Time: 08/16/22  1:16 PM   Specimen: BLOOD  Result Value Ref Range Status   Specimen Description BLOOD SITE NOT SPECIFIED  Final   Special Requests   Final    BOTTLES DRAWN AEROBIC AND ANAEROBIC Blood Culture results may not be optimal due to an inadequate volume of blood received in culture bottles   Culture   Final    NO GROWTH 5 DAYS Performed at [facility], [place]  [address], [place], [place] [address]    Report Status 08/21/2022 FINAL  Final  Culture, blood (Routine X 2) w Reflex to ID Panel     Status: None   Collection Time: 08/16/22  1:22 PM   Specimen: BLOOD  Result Value Ref Range Status   Specimen Description BLOOD SITE NOT SPECIFIED  Final   Special Requests   Final    BOTTLES DRAWN AEROBIC AND ANAEROBIC Blood Culture results may not be optimal due to an inadequate volume of blood received in culture bottles   Culture   Final    NO GROWTH 5 DAYS Performed at [facility], [place] [address] [place], [place] [address]    Report Status 08/21/2022 FINAL  Final         Radiology Studies: No results found.      Scheduled Meds:  atorvastatin  40 mg Oral Daily   Carbidopa-Levodopa ER  2 tablet Oral TID   cholecalciferol  2,000 Units Oral QPM   cyanocobalamin  1,000 mcg Oral QPM   digoxin  0.125 mg Oral Daily   diltiazem  60 mg Oral Q8H   docusate sodium  100 mg Oral BID   ezetimibe  10 mg Oral q morning   folic acid  1 mg Oral Daily   melatonin  3 mg Oral QHS   pantoprazole  40 mg Oral Daily    polyethylene glycol  17 g Oral Daily   QUEtiapine  25 mg Oral BID   Continuous Infusions:  sodium chloride       Bonnielee Haff, MD [facility] 08/25/2022, 9:12 AM

## 2022-08-26 DIAGNOSIS — G20A1 Parkinson's disease without dyskinesia, without mention of fluctuations: Secondary | ICD-10-CM | POA: Diagnosis not present

## 2022-08-26 DIAGNOSIS — I10 Essential (primary) hypertension: Secondary | ICD-10-CM | POA: Diagnosis not present

## 2022-08-26 DIAGNOSIS — S065XAA Traumatic subdural hemorrhage with loss of consciousness status unknown, initial encounter: Secondary | ICD-10-CM | POA: Diagnosis not present

## 2022-08-26 DIAGNOSIS — G9341 Metabolic encephalopathy: Secondary | ICD-10-CM | POA: Diagnosis not present

## 2022-08-26 LAB — BASIC METABOLIC PANEL
Anion gap: 7 (ref 5–15)
BUN: 14 mg/dL (ref 8–23)
CO2: 26 mmol/L (ref 22–32)
Calcium: 9.5 mg/dL (ref 8.9–10.3)
Chloride: 107 mmol/L (ref 98–111)
Creatinine, Ser: 0.97 mg/dL (ref 0.61–1.24)
GFR, Estimated: >60 mL/min (ref >60)
Glucose, Bld: 117 mg/dL — ABNORMAL HIGH (ref 70–99)
Potassium: 3.3 mmol/L — ABNORMAL LOW (ref 3.5–5.1)
Sodium: 140 mmol/L (ref 135–145)

## 2022-08-26 LAB — MAGNESIUM: Magnesium: 2.1 mg/dL (ref 1.7–2.4)

## 2022-08-26 MED ORDER — POTASSIUM CHLORIDE CRYS ER 20 MEQ PO TBCR
40.0000 meq | EXTENDED_RELEASE_TABLET | Freq: Two times a day (BID) | ORAL | Status: AC
  Administered 2022-08-26 (×2): 40 meq via ORAL
  Filled 2022-08-26 (×2): qty 2

## 2022-08-26 NOTE — Progress Notes (Signed)
PROGRESS NOTE    Adam Kane  LGX:[id number] DOB: [birth year] DOA: 08/14/2022 PCP: Crist Infante, MD   Brief Narrative:  88 year old male with history of atrial fibrillation on Eliquis, CAD status post CABG, hypertension, hyperlipidemia, Parkinson's disease, hospitalization for UTI last month presented after a fall and altered mental status.  On presentation, CT of the head showed subdural hematoma around the left hemisphere.  Patient was admitted to ICU service.  Neurosurgery was consulted and recommended conservative management.  Patient received adnexa.  Repeat CT head was stable.  He was started on Keppra.  He was transferred to [facility] service from 08/16/2022 onwards.  He subsequently had fever and was treated with antibiotics for possible UTI/left lower lobe pneumonia and has completed antibiotic treatment.  Assessment & Plan:   Subdural hematoma after a fall -Was on Eliquis prior to presentation: Received adnexxa -Neurosurgery recommended conservative management with follow-up CT head without contrast in 2 weeks.  Patient was given Keppra for 7 days.   Continue to hold Eliquis till reevaluation by neurosurgery as an outpatient -Patient had MRI of brain on 08/21/2022 because of daughter's concern of intermittent lethargy and confusion which showed left hemispheric subdural hematoma increasing in size with some midline shift.   Daughter wanted to talk to neurosurgery again.  This was discussed with Dr. Reatha Armour on 12/22 and with Dr. Annette Stable on 12/23.   Discussed with daughter over the weekend and she did not have any further questions regarding this.  Patient is stable from a neurological standpoint. He will need to follow-up with neurosurgery in 2 weeks from 12/15.    Acute metabolic encephalopathy -Possibly from above.   -Repeat CT of the head done showed stable subdural hematoma.   -Patient was noted to be intermittently confused and lethargic.  Subsequently MRI brain and EEG done on  08/21/2022.  EEG did not show any seizures.  Neurology was consulted who recommended to monitor mental status with no further interventions needed and neurology signed off on 08/21/2022. -PT/OT recommend SNF placement.  TOC following. -Patient has been started on diet as per SLP recommendations. -Empirically started on oral folic acid Occasional confusion is noted.  Noted to be on mittens again this morning.  He was started on low-dose Seroquel yesterday.  Hopefully his medications can be discontinued today.  Chronic atrial fibrillation Continue oral digoxin and Cardizem.  Eliquis to be resumed only after clearance from neurosurgery.   Noted to be tachycardic this morning.  Yet to get his Cardizem.  Hopefully heart rate will improve afterwards.  Fever/Possible UTI/Possible left lower lobe pneumonia Completed 5-day course of Rocephin and Zithromax on 08/20/2022 Cultures negative so far Remains afebrile.  Hyperlipidemia Continue statin  Essential hypertension Blood pressure is reasonably well-controlled.  Continue diltiazem.  Parkinson's disease Continue carbidopa levodopa   Anemia of chronic disease No evidence of overt bleeding.  Goals of care Palliative care following intermittently.  Hypokalemia Potassium to be replaced.  Magnesium is 2.1  DVT prophylaxis: SCDs Code Status: DNR Family Communication: No family at bedside.  Discussed with the daughter over the weekend. Disposition Plan: Waiting on SNF  Status is: Inpatient Remains inpatient appropriate because: Need for SNF placement.   Consultants:  PCCM. Neurosurgery.  Palliative care.  Neurology  Procedures: EEG  Antimicrobials:  Anti-infectives (From admission, onward)    Start     Dose/Rate Route Frequency Ordered Stop   08/17/22 1115  azithromycin (ZITHROMAX) 500 mg in sodium chloride 0.9 % 250 mL IVPB  Status:  Discontinued  500 mg 250 mL/hr over 60 Minutes Intravenous Every 24 hours 08/17/22 1018  08/21/22 1146   08/16/22 1230  cefTRIAXone (ROCEPHIN) 2 g in sodium chloride 0.9 % 100 mL IVPB  Status:  Discontinued        2 g 200 mL/hr over 30 Minutes Intravenous Daily 08/16/22 1146 08/20/22 1431        Subjective: Denies any chest pain shortness of breath.  Slow to respond to questions.  Does not appear to be agitated.   Objective: Vitals:   08/26/22 0407 08/26/22 0443 08/26/22 0635 08/26/22 0826  BP: (!) 90/55  (!) 129/98 117/73  Pulse: 98   (!) 105  Resp:    19  Temp: 97.9 F (36.6 C)   97.8 F (36.6 C)  TempSrc: Oral   Axillary  SpO2: 96%   91%  Weight:  103.8 kg      Intake/Output Summary (Last 24 hours) at 08/26/2022 0855 Last data filed at 08/25/2022 1739 Gross per 24 hour  Intake --  Output 400 ml  Net -400 ml    Filed Weights   08/24/22 0500 08/25/22 0412 08/26/22 0443  Weight: 103.8 kg 103.8 kg 103.8 kg    Examination:  General appearance: Awake alert.  In no distress.  Mildly distracted. Resp: Clear to auscultation bilaterally.  Normal effort Cardio: S1-S2 is tachycardic, irregularly irregular GI: Abdomen is soft.  Nontender nondistended.  Bowel sounds are present normal.  No masses organomegaly Extremities: No edema.  Physical deconditioning is noted.     Data Reviewed:   CBC: Recent Labs  Lab 08/20/22 0410 08/22/22 0349 08/23/22 0338  WBC 7.0 8.0 8.4  NEUTROABS 4.6 5.6 5.4  HGB 10.1* 10.7* 10.5*  HCT 32.2* 34.1* 34.0*  MCV 80.3 80.2 81.1  PLT 242 284 263    Basic Metabolic Panel: Recent Labs  Lab 08/20/22 0410 08/22/22 0349 08/23/22 0338 08/26/22 0511  NA 138 138 137 140  K 3.4* 3.8 3.7 3.3*  CL 106 105 104 107  CO2 '24 25 24 26  '$ GLUCOSE 116* 131* 126* 117*  BUN '13 11 14 14  '$ CREATININE 0.87 0.92 0.85 0.97  CALCIUM 9.3 10.0 9.9 9.5  MG 1.7 1.9 1.9 2.1    GFR: Estimated Creatinine Clearance: 65.9 mL/min (by C-G formula based on SCr of 0.97 mg/dL).  Liver Function Tests: Recent Labs  Lab 08/22/22 0349  AST 30   ALT 27  ALKPHOS 87  BILITOT 0.8  PROT 6.0*  ALBUMIN 3.1*    Recent Labs  Lab 08/22/22 0349  AMMONIA 17    CBG: Recent Labs  Lab 08/22/22 1604 08/22/22 1942 08/22/22 2308 08/23/22 0327 08/23/22 0803  GLUCAP 155* 192* 114* 122* 124*     Recent Results (from the past 240 hour(s))  Culture, blood (Routine X 2) w Reflex to ID Panel     Status: None   Collection Time: 08/16/22  1:16 PM   Specimen: BLOOD  Result Value Ref Range Status   Specimen Description BLOOD SITE NOT SPECIFIED  Final   Special Requests   Final    BOTTLES DRAWN AEROBIC AND ANAEROBIC Blood Culture results may not be optimal due to an inadequate volume of blood received in culture bottles   Culture   Final    NO GROWTH 5 DAYS Performed at [facility], [place] [address], [place], [place] [address]    Report Status 08/21/2022 FINAL  Final  Culture, blood (Routine X 2) w Reflex to ID Panel  Status: None   Collection Time: 08/16/22  1:22 PM   Specimen: BLOOD  Result Value Ref Range Status   Specimen Description BLOOD SITE NOT SPECIFIED  Final   Special Requests   Final    BOTTLES DRAWN AEROBIC AND ANAEROBIC Blood Culture results may not be optimal due to an inadequate volume of blood received in culture bottles   Culture   Final    NO GROWTH 5 DAYS Performed at [facility], [place] [address], [place], [place] [address]    Report Status 08/21/2022 FINAL  Final         Radiology Studies: No results found.   Scheduled Meds:  atorvastatin  40 mg Oral Daily   Carbidopa-Levodopa ER  2 tablet Oral TID   cholecalciferol  2,000 Units Oral QPM   cyanocobalamin  1,000 mcg Oral QPM   digoxin  0.125 mg Oral Daily   diltiazem  60 mg Oral Q8H   docusate sodium  100 mg Oral BID   ezetimibe  10 mg Oral q morning   folic acid  1 mg Oral Daily   melatonin  3 mg Oral QHS   pantoprazole  40 mg Oral Daily   polyethylene glycol  17 g Oral Daily   QUEtiapine  25 mg Oral BID   Continuous  Infusions:  sodium chloride       Bonnielee Haff, MD [facility] 08/26/2022, 8:55 AM

## 2022-08-26 NOTE — Progress Notes (Addendum)
CSW spoke with Merrily Pew at [facility] who states the facility can accept the patient once authorization is approved.  Patient's insurance authorization has been initiated.  Madilyn Fireman, MSW, LCSW Transitions of Care  Clinical Social Worker II [phone]

## 2022-08-26 NOTE — Progress Notes (Signed)
Physical Therapy Treatment Patient Details Name: Adam Kane MRN: [id number] DOB: 14-Sep-1932 Today's Date: 08/26/2022   History of Present Illness Pt is a 88 year old male who presented to the [facility] ER after a fall and altered mentation.CT showed subdural hematoma around the left hemisphere.Acute metabolic encephalopathy,Possible UTI  Possible left lower lobe pneumonia  PHMx:atrial fibrillation on eliquis, CAD s/p CABG, CKD, hypertension, hyperlidpidemia and parkinson's disease    PT Comments    Pt making slow progress, session today limited by lethargy and fatigue as pt had large BM once standing and maintain standing 5 mins for cleanup. Needed mod A to come to EOB as well as mod A for sit>stand. Was able to stand from higher position on stedy with min A. Performed multiple sit>stand and seated there ex. Pt very flat throughout session and visibly fatigued by end. SPO2 94% on RA, HR low 100's. PT will continue to follow.    Recommendations for follow up therapy are one component of a multi-disciplinary discharge planning process, led by the attending physician.  Recommendations may be updated based on patient status, additional functional criteria and insurance authorization.  Follow Up Recommendations  Skilled nursing-short term rehab (<3 hours/day) Can patient physically be transported by private vehicle: No   Assistance Recommended at Discharge Frequent or constant Supervision/Assistance  Patient can return home with the following A little help with walking and/or transfers;A little help with bathing/dressing/bathroom;Assistance with cooking/housework;Assist for transportation;Help with stairs or ramp for entrance   Equipment Recommendations  Other (comment) (TBD)    Recommendations for Other Services       Precautions / Restrictions Precautions Precautions: Fall Restrictions Weight Bearing Restrictions: No     Mobility  Bed Mobility Overal bed mobility: Needs  Assistance Bed Mobility: Supine to Sit     Supine to sit: HOB elevated, Mod assist     General bed mobility comments: pt initiated supine to sit with vc's. Needed mod A for elevation of trunk as well as to get LE's fully off bed.    Transfers Overall transfer level: Needs assistance Equipment used: Ambulation equipment used Transfers: Sit to/from Stand Sit to Stand: From elevated surface, Mod assist, +2 safety/equipment           General transfer comment: performed multiple sit>stand within stedy. Pt had large BM in standing. Was able to verbalize that he needed to go but unable to hold it until pivoted to [facility]. Sit>stand x5 and able to maintain standing x5 mins for pericare    Ambulation/Gait               General Gait Details: plan was to stand and ambulate from recliner but after being bathed prior to session and then clean up of BM, pt stated he was too fatigued to ambulate.   Stairs             Wheelchair Mobility    Modified Rankin (Stroke Patients Only)       Balance Overall balance assessment: Needs assistance Sitting-balance support: Bilateral upper extremity supported, Feet supported, No upper extremity supported Sitting balance-Leahy Scale: Poor Sitting balance - Comments: anterior lean at time, posterior lean at times   Standing balance support: Bilateral upper extremity supported Standing balance-Leahy Scale: Poor Standing balance comment: heavy reliance on UE support and mod A                            Cognition Arousal/Alertness: Lethargic Behavior During  Therapy: Flat affect (son in law reports flatter than his normal) Overall Cognitive Status: Impaired/Different from baseline Area of Impairment: Following commands, Safety/judgement, Awareness, Problem solving                       Following Commands: Follows one step commands with increased time Safety/Judgement: Decreased awareness of safety, Decreased awareness  of deficits Awareness: Intellectual Problem Solving: Difficulty sequencing, Requires verbal cues, Requires tactile cues General Comments: minimal verbalization. Was able to state that yesterday was Christmas. Remained lethargic throughout session        Exercises General Exercises - Lower Extremity Ankle Circles/Pumps: AROM, Both, 10 reps, Seated Long Arc Quad: AROM, Both, 5 reps, Seated Other Exercises Other Exercises: shoulder flexion 3x each side    General Comments General comments (skin integrity, edema, etc.): VSS on RA      Pertinent Vitals/Pain Pain Assessment Pain Assessment: Faces Faces Pain Scale: No hurt    Home Living                          Prior Function            PT Goals (current goals can now be found in the care plan section) Acute Rehab PT Goals Patient Stated Goal: unable to state PT Goal Formulation: Patient unable to participate in goal setting Time For Goal Achievement: 09/03/22 Potential to Achieve Goals: Good Progress towards PT goals: Progressing toward goals    Frequency    Min 3X/week      PT Plan Current plan remains appropriate    Co-evaluation              AM-PAC PT "6 Clicks" Mobility   Outcome Measure  Help needed turning from your back to your side while in a flat bed without using bedrails?: A Lot Help needed moving from lying on your back to sitting on the side of a flat bed without using bedrails?: A Lot Help needed moving to and from a bed to a chair (including a wheelchair)?: A Lot Help needed standing up from a chair using your arms (e.g., wheelchair or bedside chair)?: A Lot Help needed to walk in hospital room?: Total Help needed climbing 3-5 steps with a railing? : Total 6 Click Score: 10    End of Session Equipment Utilized During Treatment: Gait belt Activity Tolerance: Patient tolerated treatment well Patient left: in chair;with call bell/phone within reach;with chair alarm set;with  family/visitor present Nurse Communication: Mobility status PT Visit Diagnosis: Unsteadiness on feet (R26.81);Other abnormalities of gait and mobility (R26.89);Difficulty in walking, not elsewhere classified (R26.2)     Time: 2641-5830 PT Time Calculation (min) (ACUTE ONLY): 31 min  Charges:  $Therapeutic Activity: 23-37 mins                     Leighton Roach, PT  Acute Rehab Services Secure chat preferred Office [place] 08/26/2022, 11:08 AM

## 2022-08-27 DIAGNOSIS — S065XAA Traumatic subdural hemorrhage with loss of consciousness status unknown, initial encounter: Secondary | ICD-10-CM | POA: Diagnosis not present

## 2022-08-27 LAB — GLUCOSE, CAPILLARY: Glucose-Capillary: 103 mg/dL — ABNORMAL HIGH (ref 70–99)

## 2022-08-27 MED ORDER — QUETIAPINE FUMARATE 25 MG PO TABS: 25.0000 mg | ORAL_TABLET | Freq: Every day | ORAL | Status: DC

## 2022-08-27 MED ORDER — QUETIAPINE FUMARATE 25 MG PO TABS: 25.0000 mg | ORAL_TABLET | Freq: Two times a day (BID) | ORAL | Status: DC

## 2022-08-27 MED ORDER — DILTIAZEM HCL 90 MG PO TABS: 90.0000 mg | ORAL_TABLET | Freq: Three times a day (TID) | ORAL | Status: DC

## 2022-08-27 MED ORDER — QUETIAPINE FUMARATE 50 MG PO TABS: 25.0000 mg | ORAL_TABLET | Freq: Every day | ORAL | Status: DC

## 2022-08-27 MED ORDER — DILTIAZEM HCL 90 MG PO TABS
90.0000 mg | ORAL_TABLET | Freq: Three times a day (TID) | ORAL | Status: DC
  Administered 2022-08-27: 90 mg via ORAL
  Filled 2022-08-27 (×2): qty 1

## 2022-08-27 MED ORDER — BENZONATATE 200 MG PO CAPS: 200.0000 mg | ORAL_CAPSULE | Freq: Three times a day (TID) | ORAL | 0 refills | Status: AC | PRN

## 2022-08-27 NOTE — TOC Progression Note (Signed)
Transition of Care [facility]) - Progression Note    Patient Details  Name: Adam Kane MRN: [id number] Date of Birth: May 29, 1933  Transition of Care [facility]) CM/SW [place], [place] Phone Number: 08/27/2022, 11:20 AM  Clinical Narrative:     Received insurance Josem Kaufmann  [id number] reference # [id number] approved 12/26-12/28  Sent message to [facility] - waiting on response.  Expected Discharge Plan: [place] Barriers to Discharge: Insurance Authorization, SNF Pending bed offer  Expected Discharge Plan and Services In-house Referral: Clinical Social Work     Living arrangements for the past 2 months: Single Family Home Expected Discharge Date: 08/27/22                                     Social Determinants of Health (SDOH) Interventions SDOH Screenings   Food Insecurity: Unknown (07/12/2022)  Housing: Low Risk  (07/12/2022)  Transportation Needs: Unknown (07/12/2022)  Utilities: Unknown (07/12/2022)  Tobacco Use: Medium Risk (08/14/2022)    Readmission Risk Interventions     No data to display

## 2022-08-27 NOTE — Care Management Important Message (Signed)
Important Message  Patient Details  Name: Adam Kane MRN: [id number] Date of Birth: [birth year]   Medicare Important Message Given:  Yes     Hannah Beat 08/27/2022, 11:14 AM

## 2022-08-27 NOTE — Discharge Summary (Addendum)
Physician Discharge Summary  Adam Kane OMA:[id number] DOB: [birth year] DOA: 08/14/2022  PCP: Crist Infante, MD  Admit date: 08/14/2022 Discharge date: 08/27/2022  Admitted From: home Discharge disposition: SNF   Recommendations for Outpatient Follow-Up:   Cbc, bmp 1 week Continue to hold Eliquis till reevaluation by neurosurgery as an outpatient  He will need to follow-up with neurosurgery in 2 weeks from 12/15.   wean seroquel to off as able/prn   Discharge Diagnosis:   Principal Problem:   Subdural hematoma ([place])    Discharge Condition: Improved.  Diet recommendation: Regular.  Wound care: None.  Code status: DNR   History of Present Illness:   88 year old male with history of atrial fibrillation on Eliquis, CAD status post CABG, hypertension, hyperlipidemia, Parkinson's disease, hospitalization for UTI last month presented after a fall and altered mental status. On presentation, CT of the head showed subdural hematoma around the left hemisphere. Patient was admitted to ICU service. Neurosurgery was consulted and recommended conservative management. Patient received adnexa. Repeat CT head was stable. He was started on Keppra. He was transferred to [facility] service from 08/16/2022 onwards. He subsequently had fever and was treated with antibiotics for possible UTI/left lower lobe pneumonia and has completed antibiotic treatment.    Hospital Course by Problem:   Subdural hematoma after a fall -Was on Eliquis prior to presentation: Received adnexxa -Neurosurgery recommended conservative management with follow-up CT head without contrast in 2 weeks.  Patient was given Keppra for 7 days.   Continue to hold Eliquis till reevaluation by neurosurgery as an outpatient -Patient had MRI of brain on 08/21/2022 because of daughter's concern of intermittent lethargy and confusion which showed left hemispheric subdural hematoma increasing in size with some midline shift.    Daughter wanted to talk to neurosurgery again.  This was discussed with Dr. Reatha Armour on 12/22 and with Dr. Annette Stable on 12/23.   Discussed with daughter over the weekend and she did not have any further questions regarding this.  Patient is stable from a neurological standpoint. He will need to follow-up with neurosurgery in 2 weeks from 12/15.     Acute metabolic encephalopathy -Possibly from above.   -Repeat CT of the head done showed stable subdural hematoma.   -Patient was noted to be intermittently confused and lethargic.  Subsequently MRI brain and EEG done on 08/21/2022.  EEG did not show any seizures.  Neurology was consulted who recommended to monitor mental status with no further interventions needed and neurology signed off on 08/21/2022. -PT/OT recommend SNF placement.  TOC following. -Patient has been started on diet as per SLP recommendations. -Empirically started on oral folic acid Occasional confusion is noted- wean seroquel to off as able   Chronic atrial fibrillation Continue oral digoxin and Cardizem.  Eliquis to be resumed only after clearance from neurosurgery.   -increased cardizem   Fever/Possible UTI/Possible left lower lobe pneumonia Completed 5-day course of Rocephin and Zithromax on 08/20/2022 Cultures negative so far Remains afebrile.   Hyperlipidemia Continue statin   Essential hypertension Blood pressure is reasonably well-controlled.  Continue diltiazem.   Parkinson's disease Continue carbidopa levodopa    Anemia of chronic disease No evidence of overt bleeding.   Goals of care Palliative care following intermittently.   Hypokalemia Potassium to be replaced.  Magnesium is 2.1      Medical Consultants:    PCCM. Neurosurgery. Palliative care. Neurology   Discharge Exam:   Vitals:   08/27/22 0739 08/27/22 1149  BP:  138/85 127/73  Pulse:    Resp: 17 19  Temp: 97.9 F (36.6 C) 97.7 F (36.5 C)  SpO2: 100% 100%   Vitals:   08/27/22 0500  08/27/22 0646 08/27/22 0739 08/27/22 1149  BP:  (!) 152/70 138/85 127/73  Pulse:      Resp:   17 19  Temp:   97.9 F (36.6 C) 97.7 F (36.5 C)  TempSrc:   Oral Oral  SpO2:   100% 100%  Weight: 103.8 kg       General exam: Appears calm and comfortable.   The results of significant diagnostics from this hospitalization (including imaging, microbiology, ancillary and laboratory) are listed below for reference.     Procedures and Diagnostic Studies:   CT Head Wo Contrast  Result Date: 08/15/2022 CLINICAL DATA:  Mental status change, unknown cause. Syncope. Subdural hematoma. EXAM: CT HEAD WITHOUT CONTRAST TECHNIQUE: Contiguous axial images were obtained from the base of the skull through the vertex without intravenous contrast. RADIATION DOSE REDUCTION: This exam was performed according to the departmental dose-optimization program which includes automated exposure control, adjustment of the mA and/or kV according to patient size and/or use of iterative reconstruction technique. COMPARISON:  Head CT earlier today FINDINGS: Brain: Subdural hematoma over the left lateral cerebral convexity, falx, and left tentorium measures up to 6 mm in thickness, unchanged. Subdural hematoma component inferior to the left temporal lobe measures up to 17 mm in thickness, also unchanged. 3 mm of rightward midline shift is unchanged. There is the suggestion of increasing hypoattenuation within cortex and white matter of the anterior left temporal lobe, however assessment is limited by motion and skull base streak artifact. No new intracranial hemorrhage is identified. There is mild cerebral atrophy. Hypodensities elsewhere in the cerebral white matter bilaterally are nonspecific but compatible with mild chronic small vessel ischemic disease. Vascular: No hyperdense vessel. Skull: No acute fracture or suspicious osseous lesion. Sinuses/Orbits: Chronic right sphenoid sinusitis. No significant mastoid fluid. Bilateral  cataract extraction. Other: None. IMPRESSION: 1. Unchanged left-sided subdural hematoma and 3 mm of rightward midline shift. 2. Increased hypodensity in the anterior left temporal lobe, indeterminate for worsening edema (such as from infarct or infection) versus artifact. Consider brain MRI for further evaluation. Electronically Signed   By: Logan Bores M.D.   On: 08/15/2022 16:35   CT HEAD WO CONTRAST (5MM)  Result Date: 08/15/2022 CLINICAL DATA:  Subdural hematoma Subdural hemorrhage subdural hematoma follow up EXAM: CT HEAD WITHOUT CONTRAST TECHNIQUE: Contiguous axial images were obtained from the base of the skull through the vertex without intravenous contrast. RADIATION DOSE REDUCTION: This exam was performed according to the departmental dose-optimization program which includes automated exposure control, adjustment of the mA and/or kV according to patient size and/or use of iterative reconstruction technique. COMPARISON:  CT head 08/14/2022 6:13 p.m. FINDINGS: Brain: Cerebral ventricle sizes are concordant with the degree of cerebral volume loss. Patchy and confluent areas of decreased attenuation are noted throughout the deep and periventricular white matter of the cerebral hemispheres bilaterally, compatible with chronic microvascular ischemic disease. No evidence of large-territorial acute infarction. No parenchymal hemorrhage. No mass lesion. Stable left subdural hematoma along the left calvarial convexity with extension along the left tentorium and falx cerebri measuring up to 6 mm. Subdural blood inferior to the left temporal lobe measures up to 17 mm (6:46). No new extra-axial collection. Stable 3 mm left-to-right midline shift. No hydrocephalus. Basilar cisterns are patent. Vascular: No hyperdense vessel. Skull: No acute fracture or focal  lesion. Sinuses/Orbits: Right sphenoid sinus complete opacification with associated calcifications suggestive of chronic or fungal sinusitis. Otherwise  paranasal sinuses and mastoid air cells are clear. Bilateral lens replacement. The orbits are unremarkable. Other: None. IMPRESSION: 1. Stable left subdural hematoma with extension along the left tentorium, falx cerebri, and inferior to the temporal lobe. Stable 3 mm left-to-right midline shift. 2. No new acute intracranial abnormality. Electronically Signed   By: Iven Finn M.D.   On: 08/15/2022 00:43   CT HEAD WO CONTRAST  Result Date: 08/14/2022 CLINICAL DATA:  Fall, head trauma EXAM: CT HEAD WITHOUT CONTRAST CT CERVICAL SPINE WITHOUT CONTRAST TECHNIQUE: Multidetector CT imaging of the head and cervical spine was performed following the standard protocol without intravenous contrast. Multiplanar CT image reconstructions of the cervical spine were also generated. RADIATION DOSE REDUCTION: This exam was performed according to the departmental dose-optimization program which includes automated exposure control, adjustment of the mA and/or kV according to patient size and/or use of iterative reconstruction technique. COMPARISON:  CT head and cervical spine 07/11/2022 FINDINGS: CT HEAD FINDINGS Brain: Extensive subdural hematoma around the left hemisphere. This measures 4 mm in thickness in the left lateral frontal lobe. There is subdural blood inferior to the left temporal lobe measuring 14 mm in thickness. Subdural hematoma along the left tentorium and posterior falx measuring approximately 5 mm no subarachnoid hemorrhage Generalized atrophy. No acute infarct or mass. Mild chronic microvascular ischemic change in the white matter. No midline shift. Vascular: Negative for hyperdense vessel Skull: Negative Sinuses/Orbits: Mucosal edema paranasal sinuses. Complete opacification right sphenoid sinus with bony thickening. Mastoid sinus clear bilaterally. Other: None CT CERVICAL SPINE FINDINGS Alignment: Mild anterolisthesis C4-5. Skull base and vertebrae: Negative for fracture Soft tissues and spinal canal: No  soft tissue mass or contusion. Atherosclerotic calcification carotid bifurcation bilaterally. Disc levels: Cervical spondylosis with disc and facet degeneration throughout the cervical spine Upper chest: Lung apices clear bilaterally Other: None IMPRESSION: 1. Extensive subdural hematoma around the left hemisphere. This measures 4 mm in thickness in the left lateral frontal lobe. There is subdural blood inferior to the left temporal lobe measuring 14 mm in thickness. Subdural hematoma along the left tentorium and posterior falx measuring 5 mm. 2. Atrophy and chronic microvascular ischemic change. No acute infarct. 3. Negative for cervical spine fracture. 4. These results were called by telephone at the time of interpretation on 08/14/2022 at 6:24 pm to provider [facility] , who verbally acknowledged these results. Electronically Signed   By: Franchot Gallo M.D.   On: 08/14/2022 18:24   CT CERVICAL SPINE WO CONTRAST  Result Date: 08/14/2022 CLINICAL DATA:  Fall, head trauma EXAM: CT HEAD WITHOUT CONTRAST CT CERVICAL SPINE WITHOUT CONTRAST TECHNIQUE: Multidetector CT imaging of the head and cervical spine was performed following the standard protocol without intravenous contrast. Multiplanar CT image reconstructions of the cervical spine were also generated. RADIATION DOSE REDUCTION: This exam was performed according to the departmental dose-optimization program which includes automated exposure control, adjustment of the mA and/or kV according to patient size and/or use of iterative reconstruction technique. COMPARISON:  CT head and cervical spine 07/11/2022 FINDINGS: CT HEAD FINDINGS Brain: Extensive subdural hematoma around the left hemisphere. This measures 4 mm in thickness in the left lateral frontal lobe. There is subdural blood inferior to the left temporal lobe measuring 14 mm in thickness. Subdural hematoma along the left tentorium and posterior falx measuring approximately 5 mm no subarachnoid  hemorrhage Generalized atrophy. No acute infarct or mass. Mild  chronic microvascular ischemic change in the white matter. No midline shift. Vascular: Negative for hyperdense vessel Skull: Negative Sinuses/Orbits: Mucosal edema paranasal sinuses. Complete opacification right sphenoid sinus with bony thickening. Mastoid sinus clear bilaterally. Other: None CT CERVICAL SPINE FINDINGS Alignment: Mild anterolisthesis C4-5. Skull base and vertebrae: Negative for fracture Soft tissues and spinal canal: No soft tissue mass or contusion. Atherosclerotic calcification carotid bifurcation bilaterally. Disc levels: Cervical spondylosis with disc and facet degeneration throughout the cervical spine Upper chest: Lung apices clear bilaterally Other: None IMPRESSION: 1. Extensive subdural hematoma around the left hemisphere. This measures 4 mm in thickness in the left lateral frontal lobe. There is subdural blood inferior to the left temporal lobe measuring 14 mm in thickness. Subdural hematoma along the left tentorium and posterior falx measuring 5 mm. 2. Atrophy and chronic microvascular ischemic change. No acute infarct. 3. Negative for cervical spine fracture. 4. These results were called by telephone at the time of interpretation on 08/14/2022 at 6:24 pm to provider [facility] , who verbally acknowledged these results. Electronically Signed   By: Franchot Gallo M.D.   On: 08/14/2022 18:24   DG Pelvis Portable  Result Date: 08/14/2022 CLINICAL DATA:  [place], syncope EXAM: PORTABLE PELVIS 1-2 VIEWS COMPARISON:  11/17/2017 FINDINGS: Supine frontal view of the pelvis includes both hips. There are no acute displaced fractures. Mild symmetrical bilateral hip osteoarthritis. Sacroiliac joints are normal. Soft tissues are normal. IMPRESSION: 1. No acute displaced fracture. Electronically Signed   By: Randa Ngo M.D.   On: 08/14/2022 18:19   DG Chest Port 1 View  Result Date: 08/14/2022 CLINICAL DATA:  [place], syncope EXAM:  PORTABLE CHEST 1 VIEW COMPARISON:  08/21/2014 FINDINGS: Single frontal view of the chest demonstrates stable postsurgical changes from CABG. The cardiac silhouette is unremarkable. No airspace disease, effusion, or pneumothorax. Prominent vascular shadow in the right paratracheal region. No acute bony abnormalities. IMPRESSION: 1. No acute intrathoracic process. Electronically Signed   By: Randa Ngo M.D.   On: 08/14/2022 18:18   DG Hand Complete Right  Result Date: 08/14/2022 CLINICAL DATA:  Fall. EXAM: RIGHT HAND - COMPLETE 3+ VIEW COMPARISON:  None Available. FINDINGS: There is diffuse decreased bone mineralization. Severe index finger PIP joint space narrowing and peripheral osteophytosis. There is also severe joint space narrowing with moderate peripheral osteophytosis of the second and third and fifth DIP joints. Moderate thumb interphalangeal joint space narrowing and peripheral osteophytosis. Moderate joint space narrowing of the rest of the interphalangeal joints. Moderate thumb metacarpophalangeal joint space narrowing and peripheral osteophytosis. Moderate to severe thumb carpometacarpal joint space narrowing and peripheral osteophytosis. No acute fracture is seen.  No dislocation. IMPRESSION: 1. No acute fracture is seen. 2. Moderate-to-severe osteoarthritis. Electronically Signed   By: Yvonne Kendall M.D.   On: 08/14/2022 18:15     Labs:   Basic Metabolic Panel: Recent Labs  Lab 08/22/22 0349 08/23/22 0338 08/26/22 0511  NA 138 137 140  K 3.8 3.7 3.3*  CL 105 104 107  CO2 '25 24 26  '$ GLUCOSE 131* 126* 117*  BUN '11 14 14  '$ CREATININE 0.92 0.85 0.97  CALCIUM 10.0 9.9 9.5  MG 1.9 1.9 2.1   GFR Estimated Creatinine Clearance: 65.9 mL/min (by C-G formula based on SCr of 0.97 mg/dL). Liver Function Tests: Recent Labs  Lab 08/22/22 0349  AST 30  ALT 27  ALKPHOS 87  BILITOT 0.8  PROT 6.0*  ALBUMIN 3.1*   No results for input(s): "LIPASE", "AMYLASE" in the last  168  hours. Recent Labs  Lab 08/22/22 0349  AMMONIA 17   Coagulation profile No results for input(s): "INR", "PROTIME" in the last 168 hours.  CBC: Recent Labs  Lab 08/22/22 0349 08/23/22 0338  WBC 8.0 8.4  NEUTROABS 5.6 5.4  HGB 10.7* 10.5*  HCT 34.1* 34.0*  MCV 80.2 81.1  PLT 284 301   Cardiac Enzymes: No results for input(s): "CKTOTAL", "CKMB", "CKMBINDEX", "TROPONINI" in the last 168 hours. BNP: Invalid input(s): "POCBNP" CBG: Recent Labs  Lab 08/22/22 1942 08/22/22 2308 08/23/22 0327 08/23/22 0803 08/27/22 1146  GLUCAP 192* 114* 122* 124* 103*   D-Dimer No results for input(s): "DDIMER" in the last 72 hours. Hgb A1c No results for input(s): "HGBA1C" in the last 72 hours. Lipid Profile No results for input(s): "CHOL", "HDL", "LDLCALC", "TRIG", "CHOLHDL", "LDLDIRECT" in the last 72 hours. Thyroid function studies No results for input(s): "TSH", "T4TOTAL", "T3FREE", "THYROIDAB" in the last 72 hours.  Invalid input(s): "FREET3" Anemia work up No results for input(s): "VITAMINB12", "FOLATE", "FERRITIN", "TIBC", "IRON", "RETICCTPCT" in the last 72 hours. Microbiology No results found for this or any previous visit (from the past 240 hour(s)).   Discharge Instructions:   Discharge Instructions     Diet - low sodium heart healthy   Complete by: As directed    Increase activity slowly   Complete by: As directed       Allergies as of 08/27/2022       Reactions   Dilaudid [hydromorphone Hcl] Other (See Comments)   PASSES OUT   Morphine And Related Other (See Comments)   PASSES OUT        Medication List     STOP taking these medications    diltiazem 180 MG 24 hr capsule Commonly known as: TIAZAC   Eliquis 5 MG Tabs tablet Generic drug: apixaban   Tiadylt ER 180 MG 24 hr capsule Generic drug: diltiazem       TAKE these medications    acetaminophen 325 MG tablet Commonly known as: TYLENOL Take 650 mg by mouth every 6 (six) hours as  needed (for pain).   AMBULATORY NON FORMULARY MEDICATION Walker, 2 wheels, 2 tennis balls G20   atorvastatin 40 MG tablet Commonly known as: LIPITOR Take 40 mg by mouth at bedtime.   benzonatate 200 MG capsule Commonly known as: TESSALON Take 1 capsule (200 mg total) by mouth 3 (three) times daily as needed for cough.   Carbidopa-Levodopa ER 25-100 MG tablet controlled release Commonly known as: SINEMET CR Take 2 tablets by mouth 3 (three) times daily. 9:30 am/12:30pm/3:30pm What changed:  when to take this additional instructions   cyanocobalamin 1000 MCG tablet Commonly known as: VITAMIN B12 Take 1,000 mcg by mouth every evening.   digoxin 0.125 MG tablet Commonly known as: LANOXIN Take 1 tablet (0.125 mg total) daily by mouth. What changed: when to take this   diltiazem 90 MG tablet Commonly known as: CARDIZEM Take 1 tablet (90 mg total) by mouth every 8 (eight) hours.   docusate sodium 100 MG capsule Commonly known as: COLACE Take 100 mg by mouth daily.   ezetimibe 10 MG tablet Commonly known as: ZETIA Take 10 mg by mouth every morning.   omeprazole 20 MG capsule Commonly known as: PRILOSEC Take 20 mg by mouth daily before breakfast.   QUEtiapine 25 MG tablet Commonly known as: SEROQUEL Take 1 tablet (25 mg total) by mouth at bedtime. Start taking on: August 28, 2022   Vitamin D3 1000  units Caps Take 2,000 Units by mouth every evening.        Contact information for follow-up providers     Vallarie Mare, MD Follow up in 1 week(s).   Specialty: Neurosurgery Why: Needs f/u before 09/04/2022 Contact information: [address] [place] [address] [phone]              Contact information for after-discharge care     Destination     [facility] Preferred SNF .   Service: Skilled Nursing Contact information: [address] [place] [phone]                      Time  coordinating discharge: 45 min  Signed:  Geradine Girt DO  [facility] 08/27/2022, 12:21 PM

## 2022-08-27 NOTE — Progress Notes (Signed)
[facility] facility contacted twice with no answer. Voicemails left asking for call back. Pt belongings given to [name] (pt daughter). AVS and discharge paperwork sent with ambulance transport. Pt stable at time of discharge.

## 2022-08-27 NOTE — TOC Transition Note (Signed)
Transition of Care [facility]) - CM/SW Discharge Note   Patient Details  Name: Adam Kane MRN: [id number] Date of Birth: 1933-03-17  [facility] CM/SW Contact:  Vinie Sill, LCSW Phone Number: 08/27/2022, 1:12 PM   Clinical Narrative:     Patient will Discharge to: [facility] Discharge Date: 08/27/2022 Family Notified: daughter Transport By:  Per MD patient is ready for discharge. RN, patient, and facility notified of discharge. Discharge Summary sent to facility. RN given number for [phone]. Ambulance transport requested for patient.   Clinical Social Worker signing off.  Thurmond Butts, MSW, LCSW Clinical Social Worker     Final next level of care: Skilled Nursing Facility Barriers to Discharge: Barriers Resolved   Patient Goals and CMS Choice      Discharge Placement                Patient chooses bed at: [facility] Patient to be transferred to facility by: [facility] Name of family member notified: daughter Patient and family notified of of transfer: 08/27/22  Discharge Plan and Services Additional resources added to the After Visit Summary for   In-house Referral: Clinical Social Work                                   Social Determinants of Health ([place]) Interventions SDOH Screenings   Food Insecurity: Unknown (07/12/2022)  Housing: Low Risk  (07/12/2022)  Transportation Needs: Unknown (07/12/2022)  Utilities: Unknown (07/12/2022)  Tobacco Use: Medium Risk (08/14/2022)     Readmission Risk Interventions     No data to display

## 2022-09-04 DIAGNOSIS — D649 Anemia, unspecified: Secondary | ICD-10-CM

## 2022-09-04 DIAGNOSIS — Z87442 Personal history of urinary calculi: Secondary | ICD-10-CM | POA: Diagnosis not present

## 2022-09-04 DIAGNOSIS — N182 Chronic kidney disease, stage 2 (mild): Secondary | ICD-10-CM

## 2022-09-11 DIAGNOSIS — R5383 Other fatigue: Secondary | ICD-10-CM

## 2022-09-11 DIAGNOSIS — Z7901 Long term (current) use of anticoagulants: Secondary | ICD-10-CM

## 2022-09-11 DIAGNOSIS — F03911 Unspecified dementia, unspecified severity, with agitation: Secondary | ICD-10-CM

## 2022-09-12 ENCOUNTER — Telehealth: Payer: Self-pay | Admitting: Neurology

## 2022-09-12 NOTE — Telephone Encounter (Signed)
Pt's daughter called in stating the pt has been in rehab since the pt's fall in December. They had taken him off of Eliquis due to his brain bleed and they want him to have a repeat CT scan before they put him back on the Eliquis. They wanted him to see a neurosurgeon. They are wanting to find out if Dr. Carles Collet would be able to do this kind of thing and them not have to go to a neurosurgeon?

## 2022-09-12 NOTE — Telephone Encounter (Signed)
Called daughter and gave dr. Joretta Bachelor recommendations to follow nuero sx

## 2022-09-14 ENCOUNTER — Emergency Department (HOSPITAL_COMMUNITY): Payer: Medicare Other

## 2022-09-14 ENCOUNTER — Emergency Department (HOSPITAL_COMMUNITY)
Admission: EM | Admit: 2022-09-14 | Discharge: 2022-09-14 | Disposition: A | Discharge status: Skilled Nursing Facility | Payer: Medicare Other | Attending: Emergency Medicine | Admitting: Emergency Medicine

## 2022-09-14 DIAGNOSIS — G4751 Confusional arousals: Secondary | ICD-10-CM | POA: Insufficient documentation

## 2022-09-14 DIAGNOSIS — R410 Disorientation, unspecified: Secondary | ICD-10-CM

## 2022-09-14 DIAGNOSIS — Z7901 Long term (current) use of anticoagulants: Secondary | ICD-10-CM | POA: Diagnosis not present

## 2022-09-14 DIAGNOSIS — F0390 Unspecified dementia without behavioral disturbance: Secondary | ICD-10-CM | POA: Insufficient documentation

## 2022-09-14 DIAGNOSIS — R4182 Altered mental status, unspecified: Secondary | ICD-10-CM | POA: Diagnosis present

## 2022-09-14 LAB — COMPREHENSIVE METABOLIC PANEL
ALT: <5 U/L (ref 0–44)
AST: 18 U/L (ref 15–41)
Albumin: 3.2 g/dL — ABNORMAL LOW (ref 3.5–5.0)
Alkaline Phosphatase: 88 U/L (ref 38–126)
Anion gap: 7 (ref 5–15)
BUN: 15 mg/dL (ref 8–23)
CO2: 26 mmol/L (ref 22–32)
Calcium: 9.3 mg/dL (ref 8.9–10.3)
Chloride: 104 mmol/L (ref 98–111)
Creatinine, Ser: 0.95 mg/dL (ref 0.61–1.24)
GFR, Estimated: >60 mL/min (ref >60)
Glucose, Bld: 107 mg/dL — ABNORMAL HIGH (ref 70–99)
Potassium: 3.8 mmol/L (ref 3.5–5.1)
Sodium: 137 mmol/L (ref 135–145)
Total Bilirubin: 0.8 mg/dL (ref 0.3–1.2)
Total Protein: 5.6 g/dL — ABNORMAL LOW (ref 6.5–8.1)

## 2022-09-14 LAB — URINALYSIS, ROUTINE W REFLEX MICROSCOPIC
Bilirubin Urine: NEGATIVE
Glucose, UA: NEGATIVE mg/dL
Hgb urine dipstick: NEGATIVE
Ketones, ur: NEGATIVE mg/dL
Leukocytes,Ua: NEGATIVE
Nitrite: NEGATIVE
Protein, ur: NEGATIVE mg/dL
Specific Gravity, Urine: 1.011 (ref 1.005–1.030)
pH: 6 (ref 5.0–8.0)

## 2022-09-14 LAB — CBC
HCT: 34.5 % — ABNORMAL LOW (ref 39.0–52.0)
Hemoglobin: 10.3 g/dL — ABNORMAL LOW (ref 13.0–17.0)
MCH: 24.7 pg — ABNORMAL LOW (ref 26.0–34.0)
MCHC: 29.9 g/dL — ABNORMAL LOW (ref 30.0–36.0)
MCV: 82.7 fL (ref 80.0–100.0)
Platelets: 230 10*3/uL (ref 150–400)
RBC: 4.17 MIL/uL — ABNORMAL LOW (ref 4.22–5.81)
RDW: 16.8 % — ABNORMAL HIGH (ref 11.5–15.5)
WBC: 5.1 10*3/uL (ref 4.0–10.5)
nRBC: 0 % (ref 0.0–0.2)

## 2022-09-14 NOTE — Discharge Instructions (Signed)
Repeat head CT here is appropriate.  No other acute findings on workup.  Since original injury patient has been having intermittent episodes of confusion/lethargy.  These are to be monitored closely, but may continue.  If they increase in frequency or become more prolonged he needs further evaluation with neurology.Marland Kitchen

## 2022-09-14 NOTE — ED Notes (Signed)
Provided pt with food and drink per EDP request.

## 2022-09-14 NOTE — ED Triage Notes (Signed)
Pt BIB [facility] from [facility] SN[facility] for c/o AMS. Pt was sitting at lunch when he all of sudden went unresponsive for about 5 mins. Staff took pt back to his room, took 3 people to get him back in bed, pts baseline is 1 assist. Pt has hx of dementia and parkinsons. Pt is currently A/O, only complaint is that he feels tired.   BP116-64 HR 80 RR 20  95% RA  CBG 142

## 2022-09-14 NOTE — ED Provider Notes (Signed)
[facility] EMERGENCY DEPARTMENT Provider Note   CSN: [id number] Arrival date & time: 09/14/22  1309     History  Chief Complaint  Patient presents with   Altered Mental Status    Adam Kane is a 89 y.o. male.  HPI   89 year old male with past medical history of recent fall/head bleed, dementia presents emergency department from facility for change in mental status.  Patient was recently discharged from this facility after fall, head injury with subdural hematoma.  Patient was anticoagulant Eliquis secondary to history of A-fib.  This anticoagulation has been held since this admission.  Patient was treated, discharged to facility.  Daughter is a primary history giver at bedside.  She states he is conversational at baseline but has had a decline since his recent head injury.  Report from SNF is that while he was at lunch he was not responding to questions for about 5 minutes.  There was no noted syncope or seizure-like activity.  Patient was seated during this episode.  Home Medications Prior to Admission medications   Medication Sig Start Date End Date Taking? Authorizing Provider  acetaminophen (TYLENOL) 325 MG tablet Take 650 mg by mouth every 6 (six) hours as needed (for pain).    [provider]  AMBULATORY NON FORMULARY MEDICATION Walker, 2 wheels, 2 tennis balls G20 08/30/20   Tat, Eustace Quail, DO  atorvastatin (LIPITOR) 40 MG tablet Take 40 mg by mouth at bedtime.     [provider]  benzonatate (TESSALON) 200 MG capsule Take 1 capsule (200 mg total) by mouth 3 (three) times daily as needed for cough. 08/27/22   Geradine Girt, DO  Carbidopa-Levodopa ER (SINEMET CR) 25-100 MG tablet controlled release Take 2 tablets by mouth 3 (three) times daily. 9:30 am/12:30pm/3:30pm Patient taking differently: Take 2 tablets by mouth See admin instructions. Take 2 tablets by mouth in the morning, at 12 NOON, and at 4 PM 11/05/21   Tat, Wells Guiles S, DO   Cholecalciferol (VITAMIN D3) 1000 units CAPS Take 2,000 Units by mouth every evening.    [provider]  digoxin (LANOXIN) 0.125 MG tablet Take 1 tablet (0.125 mg total) daily by mouth. Patient taking differently: Take 0.125 mg by mouth every evening. 07/10/17   Croitoru, Mihai, MD  diltiazem (CARDIZEM) 90 MG tablet Take 1 tablet (90 mg total) by mouth every 8 (eight) hours. 08/27/22   Geradine Girt, DO  docusate sodium (COLACE) 100 MG capsule Take 100 mg by mouth daily.    [provider]  ezetimibe (ZETIA) 10 MG tablet Take 10 mg by mouth every morning.     [provider]  omeprazole (PRILOSEC) 20 MG capsule Take 20 mg by mouth daily before breakfast.    [provider]  QUEtiapine (SEROQUEL) 25 MG tablet Take 1 tablet (25 mg total) by mouth at bedtime. 08/28/22   Geradine Girt, DO  vitamin B-12 (CYANOCOBALAMIN) 1000 MCG tablet Take 1,000 mcg by mouth every evening.    [provider]      Allergies    Dilaudid [hydromorphone hcl] and Morphine and related    Review of Systems   Review of Systems  Unable to perform ROS: Mental status change    Physical Exam Updated Vital Signs BP (!) 155/80   Pulse 88   Resp 11   SpO2 100%  Physical Exam Vitals and nursing note reviewed.  Constitutional:      General: He is not in acute  distress.    Appearance: Normal appearance.  HENT:     Head: Normocephalic.     Mouth/Throat:     Mouth: Mucous membranes are moist.  Cardiovascular:     Rate and Rhythm: Normal rate.  Pulmonary:     Effort: Pulmonary effort is normal. No respiratory distress.  Abdominal:     Palpations: Abdomen is soft.     Tenderness: There is no abdominal tenderness.  Skin:    General: Skin is warm.  Neurological:     Mental Status: He is alert. Mental status is at baseline.     Comments: Patient mentally seems close to his "new" baseline, generalized weakness, hard to keep engaged during neuroexam, nonfocal   Psychiatric:        Mood and Affect: Mood normal.     ED Results / Procedures / Treatments   Labs (all labs ordered are listed, but only abnormal results are displayed) Labs Reviewed  COMPREHENSIVE METABOLIC PANEL - Abnormal; Notable for the following components:      Result Value   Glucose, Bld 107 (*)    Total Protein 5.6 (*)    Albumin 3.2 (*)    All other components within normal limits  CBC - Abnormal; Notable for the following components:   RBC 4.17 (*)    Hemoglobin 10.3 (*)    HCT 34.5 (*)    MCH 24.7 (*)    MCHC 29.9 (*)    RDW 16.8 (*)    All other components within normal limits  URINALYSIS, ROUTINE W REFLEX MICROSCOPIC  CBG MONITORING, ED    EKG None  Radiology DG Chest Portable 1 View  Result Date: 09/14/2022 CLINICAL DATA:  Altered mental status. EXAM: PORTABLE CHEST 1 VIEW COMPARISON:  Radiograph 08/16/2022 FINDINGS: Post median sternotomy and CABG. Stable cardiomegaly. Unchanged mediastinal contours. No pulmonary edema, focal airspace disease, large pleural effusion or pneumothorax. Grossly stable osseous structures. IMPRESSION: Chronic cardiomegaly.  No acute chest findings. Electronically Signed   By: Keith Rake M.D.   On: 09/14/2022 13:41    Procedures Procedures    Medications Ordered in ED Medications - No data to display  ED Course/ Medical Decision Making/ A&P                             Medical Decision Making Amount and/or Complexity of Data Reviewed Radiology: ordered.   89 year old male presents emergency department after an episode of confusion/decreased speech.  Patient was recently admitted and discharged for fall on thinners with head bleed.  While the patient was admitted here he did have documented episodes of confusion/lethargy.  This was deemed secondary to encephalopathy from injury and ongoing dementia.  This appears to be similar to what they witnessed at the facility today.  Workup is reassuring, blood work  baseline, urinalysis shows no UTI.  Head CT shows appropriate evolution of subdural hematoma with no acute findings.  On reevaluation patient is more conversational, daughter at bedside states that this is more his new baseline.  Plan for discharge back to facility with strict return to ED precautions.        Final Clinical Impression(s) / ED Diagnoses Final diagnoses:  None    Rx / DC Orders ED Discharge Orders     None         Lorelle Gibbs, DO 09/14/22 1926

## 2022-09-14 NOTE — ED Notes (Signed)
Patient transported to CT 

## 2022-09-15 DIAGNOSIS — F03911 Unspecified dementia, unspecified severity, with agitation: Secondary | ICD-10-CM

## 2022-09-19 ENCOUNTER — Other Ambulatory Visit: Payer: Self-pay | Admitting: Neurosurgery

## 2022-09-19 DIAGNOSIS — S065XAA Traumatic subdural hemorrhage with loss of consciousness status unknown, initial encounter: Secondary | ICD-10-CM

## 2022-10-16 ENCOUNTER — Other Ambulatory Visit: Payer: Medicare Other

## 2022-12-01 ENCOUNTER — Ambulatory Visit: Payer: Medicare Other | Admitting: Neurology

## 2022-12-15 ENCOUNTER — Telehealth: Payer: Self-pay | Admitting: Neurology

## 2022-12-15 NOTE — Telephone Encounter (Signed)
I called  Shanda Bumps at [facility] unable to get an answer

## 2022-12-15 NOTE — Telephone Encounter (Signed)
Adam Kane with [facility] called to get an appt with Dr.tat. he has been falling a lot and they need him to be seen. He had an appt 12/01/22 but the daughter called to cancel. She wants to know if we have something this month.

## 2023-03-07 ENCOUNTER — Emergency Department (HOSPITAL_COMMUNITY): Payer: Medicare Other

## 2023-03-07 ENCOUNTER — Other Ambulatory Visit: Payer: Self-pay

## 2023-03-07 ENCOUNTER — Emergency Department (HOSPITAL_COMMUNITY)
Admission: EM | Admit: 2023-03-07 | Discharge: 2023-03-07 | Disposition: A | Discharge status: Home / Self Care | Payer: Medicare Other | Attending: Emergency Medicine | Admitting: Emergency Medicine

## 2023-03-07 DIAGNOSIS — G20C Parkinsonism, unspecified: Secondary | ICD-10-CM | POA: Insufficient documentation

## 2023-03-07 DIAGNOSIS — W19XXXA Unspecified fall, initial encounter: Secondary | ICD-10-CM

## 2023-03-07 DIAGNOSIS — W01198A Fall on same level from slipping, tripping and stumbling with subsequent striking against other object, initial encounter: Secondary | ICD-10-CM | POA: Diagnosis not present

## 2023-03-07 DIAGNOSIS — S62327A Displaced fracture of shaft of fifth metacarpal bone, left hand, initial encounter for closed fracture: Secondary | ICD-10-CM | POA: Insufficient documentation

## 2023-03-07 DIAGNOSIS — Z951 Presence of aortocoronary bypass graft: Secondary | ICD-10-CM | POA: Diagnosis not present

## 2023-03-07 DIAGNOSIS — S6992XA Unspecified injury of left wrist, hand and finger(s), initial encounter: Secondary | ICD-10-CM | POA: Diagnosis present

## 2023-03-07 DIAGNOSIS — Z8546 Personal history of malignant neoplasm of prostate: Secondary | ICD-10-CM | POA: Insufficient documentation

## 2023-03-07 DIAGNOSIS — I2510 Atherosclerotic heart disease of native coronary artery without angina pectoris: Secondary | ICD-10-CM | POA: Insufficient documentation

## 2023-03-07 DIAGNOSIS — S0990XA Unspecified injury of head, initial encounter: Secondary | ICD-10-CM | POA: Diagnosis not present

## 2023-03-07 NOTE — ED Triage Notes (Signed)
BIBA - from [facility]. Had a fall earlier. Pt states he lost his balance and fell on his left hand. Denies hitting his head. Not on blood thinners. Hx parkinsons and dementia. C/o left hand pain and swelling.

## 2023-03-07 NOTE — ED Notes (Signed)
[facility] transport back to [facility] arranged at [phone].

## 2023-03-07 NOTE — Discharge Instructions (Signed)
Follow up with orthopedics- call to schedule an appointment. Tylenol as needed as directed for pain. Can elevate hand and apply ice for 20 minutes at a time.  Return to the ER for worsening or concerning symptoms.

## 2023-03-07 NOTE — ED Notes (Signed)
[facility] arrived to take patient back to SNF

## 2023-03-07 NOTE — Progress Notes (Signed)
Orthopedic Tech Progress Note Patient Details:  Adam Kane 1933-04-15 [id number]  Ortho Devices Type of Ortho Device: Short arm splint Ortho Device/Splint Location: LLE Ortho Device/Splint Interventions: Ordered, Application, Adjustment   Post Interventions Patient Tolerated: Well  [name] [name] 03/07/2023, 6:30 AM

## 2023-03-07 NOTE — ED Notes (Signed)
Ortho tech called for splint placement at this time

## 2023-03-07 NOTE — ED Provider Notes (Signed)
[facility]ERGENC[facility] DEPARTMENT AT [facility] Provider Note   CSN: [id number] Arrival date & time: 03/07/23  0309     History  Chief Complaint  Patient presents with   Fall    Left hand swelling    Adam Kane is a 89 y.o. male.  89 year old male brought in by EMS from [facility] for a fall. Patient reports losing his balance and falling yesterday. States he injured his left hand and did hit his head but declines LOC. No other injuries, complaints, concerns.        Home Medications Prior to Admission medications   Medication Sig Start Date End Date Taking? Authorizing Provider  acetaminophen (TYLENOL) 325 MG tablet Take 650 mg by mouth every 6 (six) hours as needed (for pain).    [provider]  AMBULATORY NON FORMULARY MEDICATION Walker, 2 wheels, 2 tennis balls G20 08/30/20   Tat, Octaviano Batty, DO  atorvastatin (LIPITOR) 40 MG tablet Take 40 mg by mouth at bedtime.     [provider]  benzonatate (TESSALON) 200 MG capsule Take 1 capsule (200 mg total) by mouth 3 (three) times daily as needed for cough. 08/27/22   Joseph Art, DO  Carbidopa-Levodopa ER (SINEMET CR) 25-100 MG tablet controlled release Take 2 tablets by mouth 3 (three) times daily. 9:30 am/12:30pm/3:30pm Patient taking differently: Take 2 tablets by mouth See admin instructions. Take 2 tablets by mouth in the morning, at 12 NOON, and at 4 PM 11/05/21   Tat, Lurena Joiner S, DO  Cholecalciferol (VITAMIN D3) 1000 units CAPS Take 2,000 Units by mouth every evening.    [provider]  digoxin (LANOXIN) 0.125 MG tablet Take 1 tablet (0.125 mg total) daily by mouth. Patient taking differently: Take 0.125 mg by mouth every evening. 07/10/17   Croitoru, Mihai, MD  diltiazem (CARDIZEM) 90 MG tablet Take 1 tablet (90 mg total) by mouth every 8 (eight) hours. 08/27/22   Joseph Art, DO  docusate sodium (COLACE) 100 MG capsule Take 100 mg by mouth daily.    [provider]  ezetimibe (ZETIA) 10 MG tablet Take 10 mg by mouth every morning.     [provider]  omeprazole (PRILOSEC) 20 MG capsule Take 20 mg by mouth daily before breakfast.    [provider]  QUEtiapine (SEROQUEL) 25 MG tablet Take 1 tablet (25 mg total) by mouth at bedtime. 08/28/22   Joseph Art, DO  vitamin B-12 (CYANOCOBALAMIN) 1000 MCG tablet Take 1,000 mcg by mouth every evening.    [provider]      Allergies    Dilaudid [hydromorphone hcl] and Morphine and codeine    Review of Systems   Review of Systems Negative except as per HPI Physical Exam Updated Vital Signs BP (!) 148/107   Pulse 78   Temp 97.7 F (36.5 C) (Oral)   Resp 16   Ht 6\' 5"  (1.956 m)   Wt 88.5 kg   SpO2 95%   BMI 23.12 kg/m  Physical Exam Vitals and nursing note reviewed.  Constitutional:      General: He is not in acute distress.    Appearance: He is well-developed. He is not diaphoretic.  HENT:     Head: Normocephalic and atraumatic.  Eyes:     Extraocular Movements: Extraocular movements intact.     Pupils: Pupils are equal, round, and reactive to light.  Pulmonary:     Effort: Pulmonary effort is normal.  Abdominal:     Palpations: Abdomen is soft.     Tenderness: There is no abdominal tenderness.  Musculoskeletal:        General: Swelling and tenderness present. No deformity.     Cervical back: Neck supple. No tenderness or bony tenderness.     Thoracic back: No tenderness or bony tenderness.     Lumbar back: No tenderness or bony tenderness.  Skin:    General: Skin is warm and dry.     Findings: Bruising present. No erythema or rash.  Neurological:     Mental Status: He is alert and oriented to person, place, and time.     Sensory: No sensory deficit.     Motor: No weakness.  Psychiatric:        Behavior: Behavior normal.     ED Results / Procedures / Treatments   Labs (all labs ordered are listed, but only abnormal results are displayed) Labs  Reviewed - No data to display  EKG None  Radiology DG Hand Complete Left  Result Date: 03/07/2023 CLINICAL DATA:  Fall, hand injury. EXAM: LEFT HAND - COMPLETE 3+ VIEW COMPARISON:  None Available. FINDINGS: There is a fracture of the distal fifth metacarpal with mild angulation. The remaining bony structures are intact. No dislocation. Degenerative changes are present at the interphalangeal joints, first carpometacarpal joint and wrist. Soft tissue swelling is present along the medial aspect of the hand. IMPRESSION: Minimally displaced fracture of the distal fifth metacarpal. Electronically Signed   By: Thornell Sartorius M.D.   On: 03/07/2023 04:52   CT Head Wo Contrast  Result Date: 03/07/2023 CLINICAL DATA:  Full earlier with neck trauma EXAM: CT HEAD WITHOUT CONTRAST CT CERVICAL SPINE WITHOUT CONTRAST TECHNIQUE: Multidetector CT imaging of the head and cervical spine was performed following the standard protocol without intravenous contrast. Multiplanar CT image reconstructions of the cervical spine were also generated. RADIATION DOSE REDUCTION: This exam was performed according to the departmental dose-optimization program which includes automated exposure control, adjustment of the mA and/or kV according to patient size and/or use of iterative reconstruction technique. COMPARISON:  09/14/2022 head CT FINDINGS: CT HEAD FINDINGS Brain: No evidence of acute infarction, hemorrhage, hydrocephalus, extra-axial collection or mass lesion/mass effect. Low-density in the anterior left temporal lobe since prior with volume loss, attributed encephalomalacia from contusion given location and preceding findings. Generalized atrophy. Vascular: No hyperdense vessel or unexpected calcification. Skull: Normal. Negative for fracture or focal lesion. Sinuses/Orbits: Bilateral cataract resection. Chronic polypoid density in the right sphenoid sinus with some internal mineralization and widening of the ostium. No acute  sinusitis. CT CERVICAL SPINE FINDINGS Alignment: No traumatic malalignment Skull base and vertebrae: Generalized subjective osteopenia. No acute fracture or incidental bone lesion Soft tissues and spinal canal: No prevertebral fluid or swelling. No visible canal hematoma. Disc levels: Ordinary disc space narrowing with endplate and facet spurring diffusely. C6-7 intervertebral ankylosis Upper chest: Clear apical lungs IMPRESSION: 1. No evidence of acute intracranial or cervical spine injury. 2. 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 Electronically Signed   By: Tiburcio Pea M.D.   On: 03/07/2023 04:45   CT Cervical Spine Wo Contrast  Result Date: 03/07/2023 CLINICAL DATA:  Full earlier with neck trauma EXAM: CT HEAD WITHOUT CONTRAST CT CERVICAL SPINE WITHOUT CONTRAST TECHNIQUE: Multidetector CT imaging of the head and cervical spine was performed following the standard protocol without intravenous contrast. Multiplanar CT image reconstructions of the cervical spine  were also generated. RADIATION DOSE REDUCTION: This exam was performed according to the departmental dose-optimization program which includes automated exposure control, adjustment of the mA and/or kV according to patient size and/or use of iterative reconstruction technique. COMPARISON:  09/14/2022 head CT FINDINGS: CT HEAD FINDINGS Brain: No evidence of acute infarction, hemorrhage, hydrocephalus, extra-axial collection or mass lesion/mass effect. Low-density in the anterior left temporal lobe since prior with volume loss, attributed encephalomalacia from contusion given location and preceding findings. Generalized atrophy. Vascular: No hyperdense vessel or unexpected calcification. Skull: Normal. Negative for fracture or focal lesion. Sinuses/Orbits: Bilateral cataract resection. Chronic polypoid density in the right sphenoid sinus with some internal mineralization and  widening of the ostium. No acute sinusitis. CT CERVICAL SPINE FINDINGS Alignment: No traumatic malalignment Skull base and vertebrae: Generalized subjective osteopenia. No acute fracture or incidental bone lesion Soft tissues and spinal canal: No prevertebral fluid or swelling. No visible canal hematoma. Disc levels: Ordinary disc space narrowing with endplate and facet spurring diffusely. C6-7 intervertebral ankylosis Upper chest: Clear apical lungs IMPRESSION: 1. No evidence of acute intracranial or cervical spine injury. 2. 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 Electronically Signed   By: Tiburcio Pea M.D.   On: 03/07/2023 04:45    Procedures Procedures    Medications Ordered in ED Medications - No data to display  ED Course/ Medical Decision Making/ A&P                             Medical Decision Making Amount and/or Complexity of Data Reviewed Radiology: ordered.   This patient presents to the ED for concern of fall, left hand pain, this involves an extensive number of treatment options, and is a complaint that carries with it a high risk of complications and morbidity.  The differential diagnosis includes but not limited to contusion, fracture, sprain, intracranial injury.   Co morbidities that complicate the patient evaluation  CAD, CABG, HLD, a-fib (not anticoagulated), prostate cancer, AAA, GERD, parkinsons   Additional history obtained:  Additional history obtained from EMS who contributes to history as above External records from outside source obtained and reviewed including medical history of file   Imaging Studies ordered:  I ordered imaging studies including XR left hand, CT head, CT c-spine  I independently visualized and interpreted imaging which showed XR with left 5th metacarpal fracture. CT head and c-spine without acute injury I agree with the radiologist  interpretation   Problem List / ED Course / Critical interventions / Medication management  89 year old male brought in by EMS from facility for left hand injury after fall. Not on thinner, reports hitting head without LOC. Left hand is swollen and bruised, sensation intact, cap refill present. XR shows metacarpal fracture. Will place in ulnar gutter splint and refer to ortho for follow up. Head and c-spine Cts without acute injury.  I have reviewed the patients home medicines and have made adjustments as needed   Social Determinants of Health:  Lives at facility    Test / Admission - Considered:  Stable for dc to facility to follow up with ortho OP         Final Clinical Impression(s) / ED Diagnoses Final diagnoses:  Fall, initial encounter  Closed displaced fracture of shaft of fifth metacarpal bone of left hand, initial encounter    Rx / DC Orders ED Discharge Orders  None         Alden Hipp 03/07/23 Sharen Counter, April, MD 03/07/23 [phone]

## 2023-03-25 ENCOUNTER — Ambulatory Visit: Payer: Medicare Other | Admitting: Neurology

## 2023-09-10 ENCOUNTER — Observation Stay (HOSPITAL_COMMUNITY): Payer: Medicare Other

## 2023-09-10 ENCOUNTER — Emergency Department (HOSPITAL_COMMUNITY): Payer: Medicare Other

## 2023-09-10 ENCOUNTER — Observation Stay (HOSPITAL_COMMUNITY)
Admission: EM | Admit: 2023-09-10 | Discharge: 2023-09-11 | Disposition: A | Discharge status: Skilled Nursing Facility | Payer: Medicare Other | Attending: Internal Medicine | Admitting: Internal Medicine

## 2023-09-10 ENCOUNTER — Other Ambulatory Visit: Payer: Self-pay

## 2023-09-10 DIAGNOSIS — F0280 Dementia in other diseases classified elsewhere without behavioral disturbance: Secondary | ICD-10-CM | POA: Diagnosis not present

## 2023-09-10 DIAGNOSIS — N183 Chronic kidney disease, stage 3 unspecified: Secondary | ICD-10-CM | POA: Diagnosis not present

## 2023-09-10 DIAGNOSIS — Z79899 Other long term (current) drug therapy: Secondary | ICD-10-CM | POA: Insufficient documentation

## 2023-09-10 DIAGNOSIS — I2510 Atherosclerotic heart disease of native coronary artery without angina pectoris: Secondary | ICD-10-CM | POA: Insufficient documentation

## 2023-09-10 DIAGNOSIS — Z8546 Personal history of malignant neoplasm of prostate: Secondary | ICD-10-CM | POA: Diagnosis not present

## 2023-09-10 DIAGNOSIS — Z87891 Personal history of nicotine dependence: Secondary | ICD-10-CM | POA: Diagnosis not present

## 2023-09-10 DIAGNOSIS — I129 Hypertensive chronic kidney disease with stage 1 through stage 4 chronic kidney disease, or unspecified chronic kidney disease: Secondary | ICD-10-CM | POA: Insufficient documentation

## 2023-09-10 DIAGNOSIS — R55 Syncope and collapse: Principal | ICD-10-CM | POA: Insufficient documentation

## 2023-09-10 DIAGNOSIS — G20C Parkinsonism, unspecified: Secondary | ICD-10-CM | POA: Diagnosis not present

## 2023-09-10 DIAGNOSIS — I482 Chronic atrial fibrillation, unspecified: Secondary | ICD-10-CM | POA: Insufficient documentation

## 2023-09-10 DIAGNOSIS — E785 Hyperlipidemia, unspecified: Secondary | ICD-10-CM | POA: Diagnosis present

## 2023-09-10 DIAGNOSIS — Z951 Presence of aortocoronary bypass graft: Secondary | ICD-10-CM | POA: Insufficient documentation

## 2023-09-10 DIAGNOSIS — Z7901 Long term (current) use of anticoagulants: Secondary | ICD-10-CM | POA: Diagnosis not present

## 2023-09-10 DIAGNOSIS — F0390 Unspecified dementia without behavioral disturbance: Secondary | ICD-10-CM

## 2023-09-10 DIAGNOSIS — G20A1 Parkinson's disease without dyskinesia, without mention of fluctuations: Secondary | ICD-10-CM | POA: Diagnosis present

## 2023-09-10 DIAGNOSIS — I10 Essential (primary) hypertension: Secondary | ICD-10-CM | POA: Diagnosis present

## 2023-09-10 LAB — COMPREHENSIVE METABOLIC PANEL
ALT: 5 U/L (ref 0–44)
AST: 15 U/L (ref 15–41)
Albumin: 3.8 g/dL (ref 3.5–5.0)
Alkaline Phosphatase: 87 U/L (ref 38–126)
Anion gap: 9 (ref 5–15)
BUN: 20 mg/dL (ref 8–23)
CO2: 27 mmol/L (ref 22–32)
Calcium: 9.6 mg/dL (ref 8.9–10.3)
Chloride: 102 mmol/L (ref 98–111)
Creatinine, Ser: 0.9 mg/dL (ref 0.61–1.24)
GFR, Estimated: >60 mL/min (ref >60)
Glucose, Bld: 133 mg/dL — ABNORMAL HIGH (ref 70–99)
Potassium: 3.7 mmol/L (ref 3.5–5.1)
Sodium: 138 mmol/L (ref 135–145)
Total Bilirubin: 1 mg/dL (ref 0.0–1.2)
Total Protein: 6.7 g/dL (ref 6.5–8.1)

## 2023-09-10 LAB — URINALYSIS, ROUTINE W REFLEX MICROSCOPIC
Bilirubin Urine: NEGATIVE
Glucose, UA: NEGATIVE mg/dL
Hgb urine dipstick: NEGATIVE
Ketones, ur: 5 mg/dL — AB
Leukocytes,Ua: NEGATIVE
Nitrite: NEGATIVE
Protein, ur: NEGATIVE mg/dL
Specific Gravity, Urine: 1.019 (ref 1.005–1.030)
pH: 5 (ref 5.0–8.0)

## 2023-09-10 LAB — CBC WITH DIFFERENTIAL/PLATELET
Abs Immature Granulocytes: 0.05 10*3/uL (ref 0.00–0.07)
Basophils Absolute: 0 10*3/uL (ref 0.0–0.1)
Basophils Relative: 0 %
Eosinophils Absolute: 0 10*3/uL (ref 0.0–0.5)
Eosinophils Relative: 0 %
HCT: 42.7 % (ref 39.0–52.0)
Hemoglobin: 13.9 g/dL (ref 13.0–17.0)
Immature Granulocytes: 1 %
Lymphocytes Relative: 14 %
Lymphs Abs: 1.2 10*3/uL (ref 0.7–4.0)
MCH: 29.6 pg (ref 26.0–34.0)
MCHC: 32.6 g/dL (ref 30.0–36.0)
MCV: 91 fL (ref 80.0–100.0)
Monocytes Absolute: 0.6 10*3/uL (ref 0.1–1.0)
Monocytes Relative: 7 %
Neutro Abs: 6.3 10*3/uL (ref 1.7–7.7)
Neutrophils Relative %: 78 %
Platelets: 193 10*3/uL (ref 150–400)
RBC: 4.69 MIL/uL (ref 4.22–5.81)
RDW: 15.1 % (ref 11.5–15.5)
WBC: 8.1 10*3/uL (ref 4.0–10.5)
nRBC: 0 % (ref 0.0–0.2)

## 2023-09-10 LAB — TROPONIN I (HIGH SENSITIVITY)
Troponin I (High Sensitivity): 16 ng/L (ref <18)
Troponin I (High Sensitivity): 13 ng/L (ref <18)

## 2023-09-10 LAB — D-DIMER, QUANTITATIVE: D-Dimer, Quant: 1.58 ug{FEU}/mL — ABNORMAL HIGH (ref 0.00–0.50)

## 2023-09-10 LAB — DIGOXIN LEVEL: Digoxin Level: 0.6 ng/mL — ABNORMAL LOW (ref 0.8–2.0)

## 2023-09-10 MED ORDER — ENOXAPARIN SODIUM 40 MG/0.4ML IJ SOSY
40.0000 mg | PREFILLED_SYRINGE | INTRAMUSCULAR | Status: DC
  Administered 2023-09-10: 40 mg via SUBCUTANEOUS
  Filled 2023-09-10: qty 0.4

## 2023-09-10 MED ORDER — ACETAMINOPHEN 325 MG PO TABS: 650.0000 mg | ORAL_TABLET | Freq: Four times a day (QID) | ORAL | Status: DC | PRN

## 2023-09-10 MED ORDER — SODIUM CHLORIDE (PF) 0.9 % IJ SOLN
INTRAMUSCULAR | Status: AC
  Filled 2023-09-10: qty 50

## 2023-09-10 MED ORDER — ACETAMINOPHEN 650 MG RE SUPP: 650.0000 mg | Freq: Four times a day (QID) | RECTAL | Status: DC | PRN

## 2023-09-10 MED ORDER — IOHEXOL 350 MG/ML SOLN
100.0000 mL | Freq: Once | INTRAVENOUS | Status: AC | PRN
  Administered 2023-09-10: 100 mL via INTRAVENOUS

## 2023-09-10 NOTE — ED Provider Notes (Signed)
 [place] EMERGENCY DEPARTMENT AT [facility] Provider Note   CSN: [id number] Arrival date & time: 09/10/23  1435     History  Chief Complaint  Patient presents with   Near Syncope    Adam Kane is a 89 y.o. male.   Near Syncope  89 year old male history of atrial fibrillation on Eliquis , CAD status post CABG, hypertension, hyperlipidemia, Parkinson disease, dementia presenting for abnormal breathing at facility.  He arrives from his SNF via EMS.  Per triage note he was sitting in a chair when he was found to have abnormal breathing.  He was moved and placed on nonrebreather and returned to baseline.  Patient is normally confused and he does not remember anything.  He feels well, denies any chest pain or shortness of breath or headache or weakness or numbness.  He never felt dizzy or passed out as far as he knows.  He knows his name and knows he is in the hospital but does not know the year.     Home Medications Prior to Admission medications   Medication Sig Start Date End Date Taking? Authorizing Provider  acetaminophen  (TYLENOL ) 325 MG tablet Take 650 mg by mouth every 6 (six) hours as needed for mild pain (pain score 1-3) or fever.   Yes [provider]  Carbidopa -Levodopa  ER (SINEMET  CR) 25-100 MG tablet controlled release Take 2 tablets by mouth 3 (three) times daily. 9:30 am/12:30pm/3:30pm Patient taking differently: Take 2 tablets by mouth 3 (three) times daily. 11/05/21  Yes Tat, Asberry RAMAN, DO  Cholecalciferol  (VITAMIN D3) 50 MCG (2000 UT) TABS Take 2,000 Units by mouth daily.   Yes [provider]  digoxin  (LANOXIN ) 0.125 MG tablet Take 1 tablet (0.125 mg total) daily by mouth. Patient taking differently: Take 0.125 mg by mouth See admin instructions. Take 0.125 mg by mouth once a day and hold for a heart rate less than 60 07/10/17  Yes Croitoru, Mihai, MD  diltiazem  (CARDIZEM ) 90 MG tablet Take 1 tablet (90 mg total) by mouth every 8  (eight) hours. 08/27/22  Yes Vann, Jessica U, DO  docusate sodium  (COLACE) 100 MG capsule Take 100 mg by mouth every 12 (twelve) hours as needed for mild constipation.   Yes [provider]  ezetimibe -simvastatin (VYTORIN) 10-10 MG tablet Take 1 tablet by mouth at bedtime.   Yes [provider]  omeprazole (PRILOSEC) 20 MG capsule Take 20 mg by mouth every other day.   Yes [provider]  polyethylene glycol powder (GLYCOLAX /MIRALAX ) 17 GM/SCOOP powder Take 17 g by mouth daily as needed for mild constipation (to be mixed into 6 ounces of fluid).   Yes [provider]  traZODone (DESYREL) 50 MG tablet Take 50 mg by mouth at bedtime.   Yes [provider]  Teresa Gardner Oil (ARTIFICIAL EYE) 83-15 % OINT Place 1 application  into both eyes 2 (two) times daily.   Yes [provider]  AMBULATORY NON FORMULARY MEDICATION Walker, 2 wheels, 2 tennis balls G20 08/30/20   Tat, Asberry RAMAN, DO  benzonatate  (TESSALON ) 200 MG capsule Take 1 capsule (200 mg total) by mouth 3 (three) times daily as needed for cough. Patient not taking: Reported on 09/10/2023 08/27/22   Vann, Jessica U, DO  QUEtiapine  (SEROQUEL ) 25 MG tablet Take 1 tablet (25 mg total) by mouth at bedtime. Patient not taking: Reported on 09/10/2023 08/28/22   Vann, Jessica U, DO      Allergies    Dilaudid  [hydromorphone   hcl] and Morphine and codeine    Review of Systems   Review of Systems  Cardiovascular:  Positive for near-syncope.  Review of systems completed and notable as per HPI.  ROS otherwise negative.   Physical Exam Updated Vital Signs BP (!) 166/125 (BP Location: Right Arm)   Pulse 87   Temp (!) 97.5 F (36.4 C) (Oral)   Resp 17   Ht 6' 5 (1.956 m)   Wt 88.5 kg   SpO2 95%   BMI 23.12 kg/m  Physical Exam Vitals and nursing note reviewed.  Constitutional:      General: He is not in acute distress.    Appearance: He is well-developed.  HENT:     Head:  Normocephalic and atraumatic.     Mouth/Throat:     Mouth: Mucous membranes are moist.     Pharynx: Oropharynx is clear.  Eyes:     Extraocular Movements: Extraocular movements intact.     Conjunctiva/sclera: Conjunctivae normal.     Pupils: Pupils are equal, round, and reactive to light.  Cardiovascular:     Rate and Rhythm: Normal rate and regular rhythm.     Pulses: Normal pulses.     Heart sounds: Normal heart sounds. No murmur heard. Pulmonary:     Effort: Pulmonary effort is normal. No respiratory distress.     Breath sounds: Normal breath sounds.  Abdominal:     Palpations: Abdomen is soft.     Tenderness: There is no abdominal tenderness.  Musculoskeletal:        General: No swelling.     Cervical back: Neck supple.     Right lower leg: No edema.     Left lower leg: No edema.  Skin:    General: Skin is warm and dry.     Capillary Refill: Capillary refill takes less than 2 seconds.  Neurological:     General: No focal deficit present.     Mental Status: He is alert. Mental status is at baseline.     Cranial Nerves: No cranial nerve deficit.     Sensory: No sensory deficit.     Motor: No weakness.     Comments: Slight intermittent tremor.  He is awake and alert.  Cranial nerves II through XII are intact.  Has normal speech.  He is oriented to self, place although does not member what happened earlier today and does not know the year.  Strength and sensation are intact throughout all extremities.  He is no neglect or visual field cuts or aphasia.  Psychiatric:        Mood and Affect: Mood normal.     ED Results / Procedures / Treatments   Labs (all labs ordered are listed, but only abnormal results are displayed) Labs Reviewed  COMPREHENSIVE METABOLIC PANEL - Abnormal; Notable for the following components:      Result Value   Glucose, Bld 133 (*)    All other components within normal limits  D-DIMER, QUANTITATIVE - Abnormal; Notable for the following components:    D-Dimer, Quant 1.58 (*)    All other components within normal limits  DIGOXIN  LEVEL - Abnormal; Notable for the following components:   Digoxin  Level 0.6 (*)    All other components within normal limits  CBC WITH DIFFERENTIAL/PLATELET  URINALYSIS, ROUTINE W REFLEX MICROSCOPIC  TROPONIN I (HIGH SENSITIVITY)  TROPONIN I (HIGH SENSITIVITY)    EKG EKG Interpretation Date/Time:  Thursday September 10 2023 15:33:23 EST Ventricular Rate:  47 PR Interval:  QRS Duration:  106 QT Interval:  398 QTC Calculation: 352 R Axis:   -47  Text Interpretation: Atrial fibrillation LAD, consider left anterior fascicular block Repol abnrm suggests ischemia, lateral leads Confirmed by Korie Streat [phone]) on 09/10/2023 3:39:42 PM  Radiology CT Head Wo Contrast Result Date: 09/10/2023 CLINICAL DATA:  Altered mental status. EXAM: CT HEAD WITHOUT CONTRAST TECHNIQUE: Contiguous axial images were obtained from the base of the skull through the vertex without intravenous contrast. RADIATION DOSE REDUCTION: This exam was performed according to the departmental dose-optimization program which includes automated exposure control, adjustment of the mA and/or kV according to patient size and/or use of iterative reconstruction technique. COMPARISON:  Head CT dated 03/07/2023. FINDINGS: Brain: Moderate age-related atrophy and chronic microvascular ischemic changes. Progression of encephalomalacia in the anterior left temporal lobe. There is no acute intracranial hemorrhage. No mass effect or midline shift. No extra-axial fluid collection. Vascular: No hyperdense vessel or unexpected calcification. Skull: Normal. Negative for fracture or focal lesion. Sinuses/Orbits: Mild mucoperiosteal thickening paranasal sinuses. There is facet cage in of the right sphenoid sinus. No air-fluid level. The mastoid air cells are clear. Other: None IMPRESSION: 1. No acute intracranial pathology. 2. Moderate age-related atrophy and chronic  microvascular ischemic changes. Electronically Signed   By: Vanetta Chou M.D.   On: 09/10/2023 15:55   DG Chest Port 1 View Result Date: 09/10/2023 CLINICAL DATA:  Syncope. EXAM: PORTABLE CHEST 1 VIEW COMPARISON:  09/14/2022. FINDINGS: There are nonspecific heterogeneous opacities overlying the left lower lung zone, grossly unchanged since the prior study from 08/16/2022 and favored to represent underlying fibrosis/scarring. Bilateral lung fields are otherwise clear. No acute consolidation or lung collapse. Bilateral costophrenic angles are clear. Stable cardio-mediastinal silhouette. There are surgical staples along the heart border and sternotomy wires, status post CABG (coronary artery bypass graft). No acute osseous abnormalities. The soft tissues are within normal limits. IMPRESSION: No active disease. Electronically Signed   By: Ree Molt M.D.   On: 09/10/2023 15:38    Procedures Procedures    Medications Ordered in ED Medications  enoxaparin  (LOVENOX ) injection 40 mg (40 mg Subcutaneous Given 09/10/23 2200)  acetaminophen  (TYLENOL ) tablet 650 mg (has no administration in time range)    Or  acetaminophen  (TYLENOL ) suppository 650 mg (has no administration in time range)    ED Course/ Medical Decision Making/ A&P Clinical Course as of 09/10/23 2256  Thu Sep 10, 2023  1538 Discussed with patient's son at bedside.  Patient was at the facility.  He suddenly became unresponsive.  Had some abnormal breathing and was completely unresponsive for about 10 minutes or so.  He was diaphoretic.  They laid him flat there and then he woke up and is at his baseline now.  Patient is DNR/DNI. [JD]    Clinical Course User Index [JD] Nicholaus Cassondra DEL, MD                                 Medical Decision Making Amount and/or Complexity of Data Reviewed Labs: ordered. Radiology: ordered.  Risk Decision regarding hospitalization.   Medical Decision Making:   TARIG ZIMMERS is a 89 y.o.  male who presented to the ED today with reported abnormal breathing.  He has intermittently bradycardic here although asymptomatic.  History is unclear as to what happened, unclear if he had syncopal episode.  Will discussed with facility and family to try to get a better history.  Will plan to obtain lab workup, CT head although no signs of stroke at this time but does have history of subdural hematoma.   Patient placed on continuous vitals and telemetry monitoring while in ED which was reviewed periodically.  Reviewed and confirmed nursing documentation for past medical history, family history, social history.  Reassessment and Plan:   Collateral history from patient's son concerning for prolonged episode of syncope.  He is back to baseline send noted episodes here.  Lab work, CT head are unremarkable.  However given episode of diaphoresis with prolonged syncope I am concerned he could have had an episode of symptomatic bradycardia or another dangerous cause for syncope.  I discussed the patient with the hospitalist and he was admitted for further management.  Patient's daughter who is POA was notified and comfortable with this plan.   Patient's presentation is most consistent with acute complicated illness / injury requiring diagnostic workup.           Final Clinical Impression(s) / ED Diagnoses Final diagnoses:  Syncope, unspecified syncope type    Rx / DC Orders ED Discharge Orders     None         Nicholaus Cassondra DEL, MD 09/10/23 2256

## 2023-09-10 NOTE — H&P (Signed)
 History and Physical    Adam Kane FMW:[id number] DOB: 1933/07/08 DOA: 09/10/2023  PCP: Shayne Anes, MD  Chief Complaint: Ida  HPI: Adam Kane is a 89 y.o. male with medical history significant of chronic A-fib on Eliquis , CAD status post CABG, hypertension, hyperlipidemia, Parkinson's disease, dementia, history of prostate cancer presented to the ED via EMS from [facility] due to concern for patient becoming unresponsive while sitting in a chair and having agonal breathing.  No seizure-like activity reported.  Patient was placed on nonrebreather.  On EMS arrival, blood pressure 125/78, heart rate 40-60, SpO2 99% on room air, and CBG 160.  In the ED, patient noted to be in A-fib with heart rate in the 40-60 range.  Not hypotensive.  Afebrile.  CBC and CMP without significant abnormalities.  Troponin negative x 2.  Chest x-ray showing no active disease.  CT head negative for acute intracranial abnormality.  TRH called to admit.  Patient has dementia and is oriented to self only.  He is not able to give any history.  Review of Systems:  Review of Systems  Reason unable to perform ROS: Dementia.    Past Medical History:  Diagnosis Date   A-fib [facility])    Abdominal aortic aneurysm (AAA), 30-34 mm diameter (HCC)    Arthritis    CAD (coronary artery disease)    Cancer (HCC)    prostate cancer 2009   Chronic kidney disease    kidney stone   Complication of anesthesia    if given anesthesia too quickly experiences nausea and vomiting    Diverticulosis of colon without hemorrhage    Dyslipidemia    Dysrhythmia    Early-onset Parkinson's disease    GERD (gastroesophageal reflux disease)    Heart murmur    childhood   History of kidney stones    PONV (postoperative nausea and vomiting)    S/P CABG (coronary artery bypass graft) 2001   Shortness of breath    mild exertion due to afib   Systemic hypertension    Urinary leakage     Past Surgical History:   Procedure Laterality Date   APPENDECTOMY     CARDIAC CATHETERIZATION  02/20/94   CATARACT EXTRACTION, BILATERAL     CHOLECYSTECTOMY     CORONARY ARTERY BYPASS GRAFT  2001   LIMA to LAD,SVG to PDA   CORONARY ARTERY BYPASS GRAFT     2 vessel 2001   CYSTOSCOPY WITH RETROGRADE PYELOGRAM, URETEROSCOPY AND STENT PLACEMENT Left 05/18/2014   Procedure: CYSTOSCOPY WITH RETROGRADE PYELOGRAM, URETEROSCOPY AND STENT PLACEMENT;  Surgeon: Gretel Ferrara, MD;  Location: [facility];  Service: Urology;  Laterality: Left;   CYSTOSCOPY WITH RETROGRADE PYELOGRAM, URETEROSCOPY AND STENT PLACEMENT Right 08/21/2014   Procedure: CYSTOSCOPY WITH RETROGRADE PYELOGRAM, URETEROSCOPY AND STENT PLACEMENT WITH STONE EXTRACTION basket;  Surgeon: Norleen JINNY Seltzer, MD;  Location: [facility];  Service: Urology;  Laterality: Right;   HERNIA REPAIR     HOLMIUM LASER APPLICATION Left 05/18/2014   Procedure: HOLMIUM LASER APPLICATION;  Surgeon: Gretel Ferrara, MD;  Location: [facility];  Service: Urology;  Laterality: Left;   LUMBAR LAMINECTOMY/DECOMPRESSION MICRODISCECTOMY Left 03/07/2013   Procedure: LUMBAR FOUR-FIVE EXTRAFORAMINAL LUMBAR LAMINECTOMY/DECOMPRESSION MICRODISCECTOMY 1 LEVEL;  Surgeon: Victory DELENA Gunnels, MD;  Location: [facility];  Service: Neurosurgery;  Laterality: Left;  Left lumbar four-five extraforaminal microdiskectomy   LUMBAR LAMINECTOMY/DECOMPRESSION MICRODISCECTOMY Left 01/15/2018   Procedure: Microdiscectomy - left - Lumbar Four -Lumbar Five extraforaminal;  Surgeon: Gunnels Victory, MD;  Location[facility]  OR;  Service: Neurosurgery;  Laterality: Left;  Microdiscectomy - left - Lumbar Four -Lumbar Five extraforaminal   PROSTATECTOMY     TONSILLECTOMY       reports that he quit smoking about 50 years ago. His smoking use included cigarettes. He has never used smokeless tobacco. He reports that he does not drink alcohol and does not use drugs.  Allergies  Allergen Reactions   Dilaudid  [Hydromorphone  Hcl] Other (See Comments)    PASSES  OUT   Morphine And Codeine Other (See Comments)    PASSES OUT    Family History  Problem Relation Age of Onset   Cancer Mother        bone   Breast cancer Mother    AAA (abdominal aortic aneurysm) Father    Throat cancer Father        mouth    Prior to Admission medications   Medication Sig Start Date End Date Taking? Authorizing Provider  acetaminophen  (TYLENOL ) 325 MG tablet Take 650 mg by mouth every 6 (six) hours as needed (for pain).    [provider]  AMBULATORY NON FORMULARY MEDICATION Walker, 2 wheels, 2 tennis balls G20 08/30/20   Tat, Asberry RAMAN, DO  atorvastatin  (LIPITOR) 40 MG tablet Take 40 mg by mouth at bedtime.     [provider]  benzonatate  (TESSALON ) 200 MG capsule Take 1 capsule (200 mg total) by mouth 3 (three) times daily as needed for cough. 08/27/22   Vann, Jessica U, DO  Carbidopa -Levodopa  ER (SINEMET  CR) 25-100 MG tablet controlled release Take 2 tablets by mouth 3 (three) times daily. 9:30 am/12:30pm/3:30pm Patient taking differently: Take 2 tablets by mouth See admin instructions. Take 2 tablets by mouth in the morning, at 12 NOON, and at 4 PM 11/05/21   Tat, Rebecca S, DO  Cholecalciferol  (VITAMIN D3) 1000 units CAPS Take 2,000 Units by mouth every evening.    [provider]  digoxin  (LANOXIN ) 0.125 MG tablet Take 1 tablet (0.125 mg total) daily by mouth. Patient taking differently: Take 0.125 mg by mouth every evening. 07/10/17   Croitoru, Mihai, MD  diltiazem  (CARDIZEM ) 90 MG tablet Take 1 tablet (90 mg total) by mouth every 8 (eight) hours. 08/27/22   Vann, Jessica U, DO  docusate sodium  (COLACE) 100 MG capsule Take 100 mg by mouth daily.    [provider]  ezetimibe  (ZETIA ) 10 MG tablet Take 10 mg by mouth every morning.     [provider]  omeprazole (PRILOSEC) 20 MG capsule Take 20 mg by mouth daily before breakfast.    [provider]  QUEtiapine  (SEROQUEL ) 25 MG tablet Take 1 tablet (25 mg  total) by mouth at bedtime. 08/28/22   Vann, Jessica U, DO  vitamin B-12 (CYANOCOBALAMIN ) 1000 MCG tablet Take 1,000 mcg by mouth every evening.    [provider]    Physical Exam: Vitals:   09/10/23 1517 09/10/23 1645 09/10/23 1745 09/10/23 1830  BP: 119/64 120/74  128/77  Pulse: (!) 114 66  (!) 42  Resp: 20 13  (!) 21  Temp:  97.9 F (36.6 C)    TempSrc:  Oral    SpO2: 95% 94%  98%  Weight:   88.5 kg   Height:   6' 5 (1.956 m)     Physical Exam Vitals reviewed.  Constitutional:      General: He is not in acute distress. HENT:     Head: Normocephalic and atraumatic.  Eyes:  Extraocular Movements: Extraocular movements intact.  Cardiovascular:     Rate and Rhythm: Normal rate. Rhythm irregular.     Pulses: Normal pulses.  Pulmonary:     Effort: Pulmonary effort is normal. No respiratory distress.     Breath sounds: Normal breath sounds. No stridor. No wheezing, rhonchi or rales.  Abdominal:     General: Bowel sounds are normal. There is no distension.     Palpations: Abdomen is soft.     Tenderness: There is no abdominal tenderness. There is no guarding.  Musculoskeletal:     Cervical back: Normal range of motion.     Right lower leg: No edema.     Left lower leg: No edema.  Skin:    General: Skin is warm and dry.  Neurological:     Mental Status: He is alert.     Cranial Nerves: No cranial nerve deficit.     Sensory: No sensory deficit.     Motor: No weakness.     Comments: Oriented to self only Follows commands appropriately.     Labs on Admission: I have personally reviewed following labs and imaging studies  CBC: Recent Labs  Lab 09/10/23 1530  WBC 8.1  NEUTROABS 6.3  HGB 13.9  HCT 42.7  MCV 91.0  PLT 193   Basic Metabolic Panel: Recent Labs  Lab 09/10/23 1530  NA 138  K 3.7  CL 102  CO2 27  GLUCOSE 133*  BUN 20  CREATININE 0.90  CALCIUM  9.6   GFR: Estimated Creatinine Clearance: 71 mL/min (by C-G formula based on SCr of  0.9 mg/dL). Liver Function Tests: Recent Labs  Lab 09/10/23 1530  AST 15  ALT 5  ALKPHOS 87  BILITOT 1.0  PROT 6.7  ALBUMIN  3.8   No results for input(s): LIPASE, AMYLASE in the last 168 hours. No results for input(s): AMMONIA in the last 168 hours. Coagulation Profile: No results for input(s): INR, PROTIME in the last 168 hours. Cardiac Enzymes: No results for input(s): CKTOTAL, CKMB, CKMBINDEX, TROPONINI in the last 168 hours. BNP (last 3 results) No results for input(s): PROBNP in the last 8760 hours. HbA1C: No results for input(s): HGBA1C in the last 72 hours. CBG: No results for input(s): GLUCAP in the last 168 hours. Lipid Profile: No results for input(s): CHOL, HDL, LDLCALC, TRIG, CHOLHDL, LDLDIRECT in the last 72 hours. Thyroid Function Tests: No results for input(s): TSH, T4TOTAL, FREET4, T3FREE, THYROIDAB in the last 72 hours. Anemia Panel: No results for input(s): VITAMINB12, FOLATE, FERRITIN, TIBC, IRON, RETICCTPCT in the last 72 hours. Urine analysis:    Component Value Date/Time   COLORURINE YELLOW 09/14/2022 1316   APPEARANCEUR CLEAR 09/14/2022 1316   LABSPEC 1.011 09/14/2022 1316   PHURINE 6.0 09/14/2022 1316   GLUCOSEU NEGATIVE 09/14/2022 1316   HGBUR NEGATIVE 09/14/2022 1316   BILIRUBINUR NEGATIVE 09/14/2022 1316   KETONESUR NEGATIVE 09/14/2022 1316   PROTEINUR NEGATIVE 09/14/2022 1316   UROBILINOGEN 1.0 08/18/2014 1751   NITRITE NEGATIVE 09/14/2022 1316   LEUKOCYTESUR NEGATIVE 09/14/2022 1316    Radiological Exams on Admission: CT Head Wo Contrast Result Date: 09/10/2023 CLINICAL DATA:  Altered mental status. EXAM: CT HEAD WITHOUT CONTRAST TECHNIQUE: Contiguous axial images were obtained from the base of the skull through the vertex without intravenous contrast. RADIATION DOSE REDUCTION: This exam was performed according to the departmental dose-optimization program which includes automated  exposure control, adjustment of the mA and/or kV according to patient size and/or use of iterative reconstruction technique. COMPARISON:  Head CT dated 03/07/2023. FINDINGS: Brain: Moderate age-related atrophy and chronic microvascular ischemic changes. Progression of encephalomalacia in the anterior left temporal lobe. There is no acute intracranial hemorrhage. No mass effect or midline shift. No extra-axial fluid collection. Vascular: No hyperdense vessel or unexpected calcification. Skull: Normal. Negative for fracture or focal lesion. Sinuses/Orbits: Mild mucoperiosteal thickening paranasal sinuses. There is facet cage in of the right sphenoid sinus. No air-fluid level. The mastoid air cells are clear. Other: None IMPRESSION: 1. No acute intracranial pathology. 2. Moderate age-related atrophy and chronic microvascular ischemic changes. Electronically Signed   By: Vanetta Chou M.D.   On: 09/10/2023 15:55   DG Chest Port 1 View Result Date: 09/10/2023 CLINICAL DATA:  Syncope. EXAM: PORTABLE CHEST 1 VIEW COMPARISON:  09/14/2022. FINDINGS: There are nonspecific heterogeneous opacities overlying the left lower lung zone, grossly unchanged since the prior study from 08/16/2022 and favored to represent underlying fibrosis/scarring. Bilateral lung fields are otherwise clear. No acute consolidation or lung collapse. Bilateral costophrenic angles are clear. Stable cardio-mediastinal silhouette. There are surgical staples along the heart border and sternotomy wires, status post CABG (coronary artery bypass graft). No acute osseous abnormalities. The soft tissues are within normal limits. IMPRESSION: No active disease. Electronically Signed   By: Ree Molt M.D.   On: 09/10/2023 15:38    EKG: Independently reviewed.  Atrial fibrillation with slow ventricular response.  Assessment and Plan  Syncope Patient sent to the ED from his nursing facility due to concern for him becoming unresponsive while sitting in  a chair and having agonal breathing.  No seizure-like activity was reported.  He has chronic A-fib and heart rate noted to be in the 40-60 range initially.  Not hypotensive or hypoxic.  Not hypoglycemic.  Troponin negative x 2.  Chest x-ray showing no active disease.  CT head negative for acute intracranial abnormality and no focal neurodeficit on exam.  At present, patient is awake and alert.  Resting comfortably, his heart rate is now in the 80s.  He is chronically on digoxin  and Cardizem .  Will hold these medications at this time and check digoxin  level.  Continue cardiac monitoring.  Echocardiogram ordered.  I have discussed the patient's case with cardiology.  Cardiology service will see the patient in the morning.  PE less likely, check D-dimer.  Chronic A-fib Hold digoxin  and Cardizem  at this time due to concern for bradycardia.  Not on chronic anticoagulation due to history of intracranial bleed secondary to a fall in December 2023.  CAD status post CABG Workup not suggestive of ACS.  Hypertension Hold Cardizem  at this time.  Hyperlipidemia Parkinson's disease Dementia Pharmacy med rec pending.  DVT prophylaxis: Lovenox  Code Status: MOST form present at bedside.  He is DNR/DNI.  Family Communication: No family available at this time. Consults: Cardiology (Dr. Floretta) Level of care: Progressive Care Unit Admission status: It is my clinical opinion that referral for OBSERVATION is reasonable and necessary in this patient based on the above information provided. The aforementioned taken together are felt to place the patient at high risk for further clinical deterioration. However, it is anticipated that the patient may be medically stable for discharge from the hospital within 24 to 48 hours.  Editha Ram MD [facility]  If 7PM-7AM, please contact night-coverage [email or web address]  09/10/2023, 7:33 PM

## 2023-09-10 NOTE — ED Notes (Signed)
 Patient transported to CT

## 2023-09-10 NOTE — ED Triage Notes (Signed)
 Pt brought in from [facility] by EMS. Pt was sitting in chair when pt was found to have agonal breathing. Nursing moved pt to bed and placed nonrebreather on 15L. Pts baseline is confused. Pt has parkinsons and Afib.  BP 125/78  HR 40-60 CBG 160 99% RA

## 2023-09-11 ENCOUNTER — Telehealth: Payer: Self-pay

## 2023-09-11 ENCOUNTER — Observation Stay (HOSPITAL_COMMUNITY): Payer: Medicare Other

## 2023-09-11 DIAGNOSIS — E782 Mixed hyperlipidemia: Secondary | ICD-10-CM

## 2023-09-11 DIAGNOSIS — I10 Essential (primary) hypertension: Secondary | ICD-10-CM

## 2023-09-11 DIAGNOSIS — G20B1 Parkinson's disease with dyskinesia, without mention of fluctuations: Secondary | ICD-10-CM

## 2023-09-11 DIAGNOSIS — F0390 Unspecified dementia without behavioral disturbance: Secondary | ICD-10-CM

## 2023-09-11 DIAGNOSIS — I482 Chronic atrial fibrillation, unspecified: Secondary | ICD-10-CM | POA: Diagnosis not present

## 2023-09-11 DIAGNOSIS — R55 Syncope and collapse: Secondary | ICD-10-CM | POA: Diagnosis not present

## 2023-09-11 MED ORDER — DILTIAZEM HCL 30 MG PO TABS: 30.0000 mg | ORAL_TABLET | Freq: Three times a day (TID) | ORAL | Status: AC

## 2023-09-11 NOTE — Hospital Course (Addendum)
 89 y.o. male with medical history significant of chronic A-fib on Eliquis , CAD status post CABG, hypertension, hyperlipidemia, Parkinson's disease, dementia, history of prostate cancer presented to the ED via EMS from [facility] due to concern for patient becoming unresponsive with concern for agonal breathing.   Upon evaluation in the emergency department patient was initially found to be an extremely slow atrial fibrillation with heart rate in the 40s.  The hospitalist group was then called to assess the patient for admission to the hospital.  Patient underwent a thorough assessment for any potential etiologies for the patient's episode.  There was no clinical evidence of infection.  D-dimer is found to be slightly elevated however CT angiogram of the chest revealed no evidence of pulmonary embolism and again revealed no evidence of pneumonia.  There is no significant evidence of electrolyte abnormalities.  Hemoglobin and hematocrit were found to be slightly higher than baseline bringing about concern for possible hemoconcentration due to possible mild dehydration as well.  Upon reassessment the following day patient's mentation was felt to be back to baseline.  Case was discussed with cardiology and the recommendation was for the patient's usual regimen of Cardizem  to be reduced to 30 mg every 8 hours.  Patient's current regimen of diltiazem  is to be continued.  Patient is to follow-up as an outpatient with cardiology for further evaluation.  Patient is being charged back to [facility] skilled nursing facility on 1/10 in improved and stable condition.  If patient has further episodes of loss of consciousness or alterations in mental status patient is to be brought back to the emergency department.

## 2023-09-11 NOTE — Discharge Summary (Signed)
 Physician Discharge Summary   Patient: Adam Kane MRN: [id number] DOB: [birth year]  Admit date:     09/10/2023  Discharge date: 09/11/2023  Discharge Physician: Zachary JINNY Ba   PCP: Shayne Anes, MD   Recommendations at discharge:   Patient is DNR Patient has been found to have extremely slow atrial fibrillation during this hospitalization.  Patient may resume all previous medications per the medication list with the exception of the reduction of diltiazem  to 30 mg every 8 hours. Patient to consume a low-sodium diet patient to receive assistance with all meals. Follow-up with facility provider per protocol.  Case was also discussed with cardiology and they recommend an outpatient follow-up with them in the next few weeks.  They will be contacting the family with a follow-up appointment.   Please bring patient back to the emergency department if he develops recurrent episodes of loss of consciousness confusion or fever.    Discharge Diagnoses: Principal Problem:   Syncope Active Problems:   Chronic a-fib (HCC)   Essential hypertension   Parkinson's disease (HCC)   Hyperlipidemia   CAD (coronary artery disease)   Dementia (HCC)  Resolved Problems:   * No resolved hospital problems. *  Hospital Course:  89 y.o. male with medical history significant of chronic A-fib on Eliquis , CAD status post CABG, hypertension, hyperlipidemia, Parkinson's disease, dementia, history of prostate cancer presented to the ED via EMS from [facility] due to concern for patient becoming unresponsive with concern for agonal breathing.   Upon evaluation in the emergency department patient was initially found to be an extremely slow atrial fibrillation with heart rate in the 40s.  The hospitalist group was then called to assess the patient for admission to the hospital.  Patient underwent a thorough assessment for any potential etiologies for the patient's episode.  There was no clinical evidence of  infection.  D-dimer is found to be slightly elevated however CT angiogram of the chest revealed no evidence of pulmonary embolism and again revealed no evidence of pneumonia.  There is no significant evidence of electrolyte abnormalities.  Hemoglobin and hematocrit were found to be slightly higher than baseline bringing about concern for possible hemoconcentration due to possible mild dehydration as well.  Upon reassessment the following day patient's mentation was felt to be back to baseline.  Case was discussed with cardiology and the recommendation was for the patient's usual regimen of Cardizem  to be reduced to 30 mg every 8 hours.  Patient's current regimen of diltiazem  is to be continued.  Patient is to follow-up as an outpatient with cardiology for further evaluation.  Patient is being charged back to [facility] skilled nursing facility on 1/10 in improved and stable condition.  If patient has further episodes of loss of consciousness or alterations in mental status patient is to be brought back to the emergency department.       Consultants: Cardiology (phone consult) Procedures performed: None  Disposition: Skilled nursing facility Diet recommendation:  Discharge Diet Orders (From admission, onward)     Start     Ordered   09/11/23 0000  Diet - low sodium heart healthy        09/11/23 1022           Cardiac diet DISCHARGE MEDICATION: Allergies as of 09/11/2023       Reactions   Dilaudid  [hydromorphone  Hcl] Other (See Comments)   PASSES OUT   Morphine And Codeine Other (See Comments)   PASSES OUT  Medication List     STOP taking these medications    QUEtiapine  25 MG tablet Commonly known as: SEROQUEL        TAKE these medications    acetaminophen  325 MG tablet Commonly known as: TYLENOL  Take 650 mg by mouth every 6 (six) hours as needed for mild pain (pain score 1-3) or fever.   AMBULATORY NON FORMULARY MEDICATION Walker, 2 wheels, 2 tennis  balls G20   Artificial Eye 83-15 % Oint Place 1 application  into both eyes 2 (two) times daily.   benzonatate  200 MG capsule Commonly known as: TESSALON  Take 1 capsule (200 mg total) by mouth 3 (three) times daily as needed for cough.   Carbidopa -Levodopa  ER 25-100 MG tablet controlled release Commonly known as: SINEMET  CR Take 2 tablets by mouth 3 (three) times daily. 9:30 am/12:30pm/3:30pm What changed: additional instructions   digoxin  0.125 MG tablet Commonly known as: LANOXIN  Take 1 tablet (0.125 mg total) daily by mouth. What changed:  when to take this additional instructions   diltiazem  30 MG tablet Commonly known as: CARDIZEM  Take 1 tablet (30 mg total) by mouth every 8 (eight) hours. What changed:  medication strength how much to take   docusate sodium  100 MG capsule Commonly known as: COLACE Take 100 mg by mouth every 12 (twelve) hours as needed for mild constipation.   ezetimibe -simvastatin 10-10 MG tablet Commonly known as: VYTORIN Take 1 tablet by mouth at bedtime.   omeprazole 20 MG capsule Commonly known as: PRILOSEC Take 20 mg by mouth every other day.   polyethylene glycol powder 17 GM/SCOOP powder Commonly known as: GLYCOLAX /MIRALAX  Take 17 g by mouth daily as needed for mild constipation (to be mixed into 6 ounces of fluid).   traZODone 50 MG tablet Commonly known as: DESYREL Take 50 mg by mouth at bedtime.   Vitamin D3 50 MCG (2000 UT) Tabs Take 2,000 Units by mouth daily.        Follow-up Information     [facility]   Specialty: Skilled Nursing Facility Contact information: [address] [place] [address] [phone]         Croitoru, Jerel, MD. Schedule an appointment as soon as possible for a visit.   Specialty: Cardiology Why: You will be contacted with a follow-up appointment. Contact information: [address] [place] [address] [phone]                Discharge  Exam: Fredricka Weights   09/10/23 1745  Weight: 88.5 kg   Constitutional: Patient is a wake and alert but disoriented.  No associated distress.   Respiratory: clear to auscultation bilaterally, no wheezing, no crackles. Normal respiratory effort. No accessory muscle use.  Cardiovascular: Controlled rate with irregularly irregular rhythm,  No extremity edema. 2+ pedal pulses. No carotid bruits.  Abdomen: Abdomen is soft and nontender.  No evidence of intra-abdominal masses.  Positive bowel sounds noted in all quadrants.   Musculoskeletal: No joint deformity upper and lower extremities. Good ROM, no contractures. Normal muscle tone.      Condition at discharge: fair  The results of significant diagnostics from this hospitalization (including imaging, microbiology, ancillary and laboratory) are listed below for reference.   Imaging Studies: CT Angio Chest Pulmonary Embolism (PE) W or WO Contrast Result Date: 09/10/2023 CLINICAL DATA:  Sudden onset of agonal breathing, initial encounter EXAM: CT ANGIOGRAPHY CHEST WITH CONTRAST TECHNIQUE: Multidetector CT imaging of the chest was performed using the standard protocol during bolus  administration of intravenous contrast. Multiplanar CT image reconstructions and MIPs were obtained to evaluate the vascular anatomy. RADIATION DOSE REDUCTION: This exam was performed according to the departmental dose-optimization program which includes automated exposure control, adjustment of the mA and/or kV according to patient size and/or use of iterative reconstruction technique. CONTRAST:  OMNIPAQUE  IOHEXOL  350 MG/ML SOLN COMPARISON:  Chest x-ray from earlier in the same day. CT from 03/09/2020 FINDINGS: Cardiovascular: Atherosclerotic calcifications of the aorta are noted without aneurysmal dilatation. No significant opacification is identified to evaluate for dissection. Heart is enlarged in size. Coronary calcifications are noted as well as prior coronary bypass  grafting. The pulmonary artery shows a normal branching pattern bilaterally. No filling defect to suggest pulmonary embolism is noted. Mediastinum/Nodes: Thoracic inlet is within normal limits. No hilar or mediastinal adenopathy is noted. The esophagus as visualized is within normal limits. Lungs/Pleura: In the posterior aspect of the right lower lobe there is again noted changes of rounded atelectasis similar to that noted on prior examination from 2021. Peri fissural nodule is noted in the lower lobe on image number 102 of series 12 this is stable from the prior exam as well. No focal infiltrate or effusion is seen. No sizable parenchymal nodule is noted. Upper Abdomen: Multiple cysts are noted throughout the liver stable from the prior exam. The remainder of the upper abdomen is within normal limits. Musculoskeletal: Degenerative changes of the thoracic spine are seen. Review of the MIP images confirms the above findings. IMPRESSION: No evidence of pulmonary emboli. Right lower lobe rounded atelectasis stable in appearance from a prior exam of 2021. Chronic changes as described above stable from the prior exam. Aortic Atherosclerosis (ICD10-I70.0). Electronically Signed   By: Oneil Devonshire M.D.   On: 09/10/2023 23:43   CT Head Wo Contrast Result Date: 09/10/2023 CLINICAL DATA:  Altered mental status. EXAM: CT HEAD WITHOUT CONTRAST TECHNIQUE: Contiguous axial images were obtained from the base of the skull through the vertex without intravenous contrast. RADIATION DOSE REDUCTION: This exam was performed according to the departmental dose-optimization program which includes automated exposure control, adjustment of the mA and/or kV according to patient size and/or use of iterative reconstruction technique. COMPARISON:  Head CT dated 03/07/2023. FINDINGS: Brain: Moderate age-related atrophy and chronic microvascular ischemic changes. Progression of encephalomalacia in the anterior left temporal lobe. There is no  acute intracranial hemorrhage. No mass effect or midline shift. No extra-axial fluid collection. Vascular: No hyperdense vessel or unexpected calcification. Skull: Normal. Negative for fracture or focal lesion. Sinuses/Orbits: Mild mucoperiosteal thickening paranasal sinuses. There is facet cage in of the right sphenoid sinus. No air-fluid level. The mastoid air cells are clear. Other: None IMPRESSION: 1. No acute intracranial pathology. 2. Moderate age-related atrophy and chronic microvascular ischemic changes. Electronically Signed   By: Vanetta Chou M.D.   On: 09/10/2023 15:55   DG Chest Port 1 View Result Date: 09/10/2023 CLINICAL DATA:  Syncope. EXAM: PORTABLE CHEST 1 VIEW COMPARISON:  09/14/2022. FINDINGS: There are nonspecific heterogeneous opacities overlying the left lower lung zone, grossly unchanged since the prior study from 08/16/2022 and favored to represent underlying fibrosis/scarring. Bilateral lung fields are otherwise clear. No acute consolidation or lung collapse. Bilateral costophrenic angles are clear. Stable cardio-mediastinal silhouette. There are surgical staples along the heart border and sternotomy wires, status post CABG (coronary artery bypass graft). No acute osseous abnormalities. The soft tissues are within normal limits. IMPRESSION: No active disease. Electronically Signed   By: Ree Kimberlee HERO.D.  On: 09/10/2023 15:38    Microbiology: Results for orders placed or performed during the hospital encounter of 08/14/22  Resp panel by RT-PCR (RSV, Flu A&B, Covid) Anterior Nasal Swab     Status: None   Collection Time: 08/14/22  8:13 PM   Specimen: Anterior Nasal Swab  Result Value Ref Range Status   SARS Coronavirus 2 by RT PCR NEGATIVE NEGATIVE Final    Comment: (NOTE) SARS-CoV-2 target nucleic acids are NOT DETECTED.  The SARS-CoV-2 RNA is generally detectable in upper respiratory specimens during the acute phase of infection. The lowest concentration of  SARS-CoV-2 viral copies this assay can detect is 138 copies/mL. A negative result does not preclude SARS-Cov-2 infection and should not be used as the sole basis for treatment or other patient management decisions. A negative result may occur with  improper specimen collection/handling, submission of specimen other than nasopharyngeal swab, presence of viral mutation(s) within the areas targeted by this assay, and inadequate number of viral copies(<138 copies/mL). A negative result must be combined with clinical observations, patient history, and epidemiological information. The expected result is Negative.  Fact Sheet for Patients:  bloggercourse.com  Fact Sheet for Healthcare Providers:  seriousbroker.it  This test is no t yet approved or cleared by the [place]  FDA and  has been authorized for detection and/or diagnosis of SARS-CoV-2 by FDA under an Emergency Use Authorization (EUA). This EUA will remain  in effect (meaning this test can be used) for the duration of the COVID-19 declaration under Section 564(b)(1) of the Act, 21 U.S.C.section 360bbb-3(b)(1), unless the authorization is terminated  or revoked sooner.       Influenza A by PCR NEGATIVE NEGATIVE Final   Influenza B by PCR NEGATIVE NEGATIVE Final    Comment: (NOTE) The Xpert Xpress SARS-CoV-2/FLU/RSV plus assay is intended as an aid in the diagnosis of influenza from Nasopharyngeal swab specimens and should not be used as a sole basis for treatment. Nasal washings and aspirates are unacceptable for Xpert Xpress SARS-CoV-2/FLU/RSV testing.  Fact Sheet for Patients: bloggercourse.com  Fact Sheet for Healthcare Providers: seriousbroker.it  This test is not yet approved or cleared by the [place]  FDA and has been authorized for detection and/or diagnosis of SARS-CoV-2 by FDA under an Emergency Use  Authorization (EUA). This EUA will remain in effect (meaning this test can be used) for the duration of the COVID-19 declaration under Section 564(b)(1) of the Act, 21 U.S.C. section 360bbb-3(b)(1), unless the authorization is terminated or revoked.     Resp Syncytial Virus by PCR NEGATIVE NEGATIVE Final    Comment: (NOTE) Fact Sheet for Patients: bloggercourse.com  Fact Sheet for Healthcare Providers: seriousbroker.it  This test is not yet approved or cleared by the [place]  FDA and has been authorized for detection and/or diagnosis of SARS-CoV-2 by FDA under an Emergency Use Authorization (EUA). This EUA will remain in effect (meaning this test can be used) for the duration of the COVID-19 declaration under Section 564(b)(1) of the Act, 21 U.S.C. section 360bbb-3(b)(1), unless the authorization is terminated or revoked.  Performed at [facility], [address], [place], [place] [address]   Culture, blood (Routine X 2) w Reflex to ID Panel     Status: None   Collection Time: 08/16/22  1:16 PM   Specimen: BLOOD  Result Value Ref Range Status   Specimen Description BLOOD SITE NOT SPECIFIED  Final   Special Requests   Final    BOTTLES DRAWN AEROBIC AND ANAEROBIC  Blood Culture results may not be optimal due to an inadequate volume of blood received in culture bottles   Culture   Final    NO GROWTH 5 DAYS Performed at [facility], [address] [place], [place] [address]    Report Status 08/21/2022 FINAL  Final  Culture, blood (Routine X 2) w Reflex to ID Panel     Status: None   Collection Time: 08/16/22  1:22 PM   Specimen: BLOOD  Result Value Ref Range Status   Specimen Description BLOOD SITE NOT SPECIFIED  Final   Special Requests   Final    BOTTLES DRAWN AEROBIC AND ANAEROBIC Blood Culture results may not be optimal due to an inadequate volume of blood received in culture bottles    Culture   Final    NO GROWTH 5 DAYS Performed at [facility], [address] [place], [place] [address]    Report Status 08/21/2022 FINAL  Final    Labs: CBC: Recent Labs  Lab 09/10/23 1530  WBC 8.1  NEUTROABS 6.3  HGB 13.9  HCT 42.7  MCV 91.0  PLT 193   Basic Metabolic Panel: Recent Labs  Lab 09/10/23 1530  NA 138  K 3.7  CL 102  CO2 27  GLUCOSE 133*  BUN 20  CREATININE 0.90  CALCIUM  9.6   Liver Function Tests: Recent Labs  Lab 09/10/23 1530  AST 15  ALT 5  ALKPHOS 87  BILITOT 1.0  PROT 6.7  ALBUMIN  3.8   CBG: No results for input(s): GLUCAP in the last 168 hours.  Discharge time spent: greater than 30 minutes.  Signed: Zachary JINNY Ba, MD [facility] 09/11/2023

## 2023-09-11 NOTE — ED Notes (Signed)
 [facility] and spoke with RN Amalia Hailey about pt's medication dose change.

## 2023-09-11 NOTE — ED Notes (Signed)
 This RN spoke to daughter, Kate to give update on pt status and daughter reports that she would rather take him back to [facility] instead of have him admitted. This RN advised daughter to call back to speak with MD later on in the morning to make that decision. Daughter voiced understanding and states she will come to the ED or call to speak with MD.

## 2023-09-11 NOTE — Telephone Encounter (Signed)
 This RNCM received secure chat with MD, ED paramedic staff regarding patient's disposition. Patient has been transported via [place] and paramedic called to provide report. This RNCM notified Starr with [facility] of medication adjustment: Dilitazem has been reduced to 30mg  every 8 hours. [place] confirmed acknowledgement.    No additional TOC needs

## 2023-09-11 NOTE — Discharge Instructions (Addendum)
 Patient is DNR Patient has been found to have extremely slow atrial fibrillation during this hospitalization.  Patient may resume all previous medications per the medication list with the exception of the reduction of diltiazem  to 30 mg every 8 hours. Patient to consume a low-sodium diet patient to receive assistance with all meals. Follow-up with facility provider per protocol.  Case was also discussed with cardiology and they recommend an outpatient follow-up with them in the next few weeks.  They will be contacting the family with a follow-up appointment.   Please bring patient back to the emergency department if he develops recurrent episodes of loss of consciousness confusion or fever.

## 2023-09-17 ENCOUNTER — Ambulatory Visit: Payer: Medicare Other | Admitting: Physician Assistant

## 2023-09-18 ENCOUNTER — Ambulatory Visit: Payer: Medicare Other | Admitting: Physician Assistant

## 2023-09-18 ENCOUNTER — Telehealth: Payer: Self-pay

## 2023-09-18 NOTE — Telephone Encounter (Signed)
Per Azalee Course, pt has not been seen since 07-15-2018, pt needs to be seen by an MD. Jeanene Erb and spoke with JILL JENKINS [facility] she states that she will call [facility] and let them know that we are cancelling his appointment and call back to reschedule.

## 2023-09-18 NOTE — Progress Notes (Deleted)
Cardiology Office Note:    Date:  09/18/2023  ID:  Adam Kane, DOB [birth year], MRN [id number] PCP: Rodrigo Ran, MD  [facility] Providers Cardiologist:  Thurmon Fair, MD { Click to update primary MD,subspecialty MD or APP then REFRESH:1}    {Click to Open Review  :1}   Patient Profile:      Adam Kane is a 89 year old male with visit pertinent history of chronic atrial fibrillation, hypertension, hyperlipidemia, CAD s/p CABG in 2001, small abdominal aortic aneurysm, Parkinson's disease, dementia.  He was last seen in clinic on 07/15/2018 just over 4 years ago.  At the time he had noticed increasing problems with orthostatic hypotension and his beta-blocker was previously discontinued as his diltiazem decreasing to 280 mg daily.  Most recently he was seen in the ED on 09/10/2023 for concern for patient becoming unresponsive with with concern of agonal breathing from [facility].  Patient does have history of dementia and is oriented to self only.  Per triage note he was sitting in his chair when he was found to have abnormal breathing he was moved and placed on nonrebreather and returned to his baseline.  Upon evaluation in the ED patient was initially found in atrial fibrillation with heart rates in the 40s.  CT head was without any acute abnormality.  Digoxin level showed 0.6 and troponins were normal.  Urinalysis was negative for UTI D-dimer was found to be slightly elevated however CT angiogram of chest revealed no evidence of pulmonary embolism and no evidence of pneumonia.  There was no evidence of significant electrolyte abnormalities.  His hemoglobin and hematocrit were found to be slightly higher than baseline.  Concern for possible hemoconcentration due to possible mild dehydration.  Case was discussed with cardiology and recommendation was for patient's usual regimen of Cardizem to be reduced to 30 mg every 8 hours.  He was discharged back to [facility] skilled  nursing facility on 09/11/2023 and improved in stable condition.      History of Present Illness:  Discussed the use of AI scribe software for clinical note transcription with the patient, who gave verbal consent to proceed.  Adam Kane is a 89 y.o. male who returns for ***Discussed the use of AI scribe software for clinical note transcription with the patient, who gave verbal consent to proceed.  History of Present Illness            ROS   See HPI ***     Home Medications:    Prior to Admission medications   Medication Sig Start Date End Date Taking? Authorizing Provider  acetaminophen (TYLENOL) 325 MG tablet Take 650 mg by mouth every 6 (six) hours as needed for mild pain (pain score 1-3) or fever.    [provider]  AMBULATORY NON FORMULARY MEDICATION Walker, 2 wheels, 2 tennis balls G20 08/30/20   Tat, Octaviano Batty, DO  benzonatate (TESSALON) 200 MG capsule Take 1 capsule (200 mg total) by mouth 3 (three) times daily as needed for cough. Patient not taking: Reported on 09/10/2023 08/27/22   Joseph Art, DO  Carbidopa-Levodopa ER (SINEMET CR) 25-100 MG tablet controlled release Take 2 tablets by mouth 3 (three) times daily. 9:30 am/12:30pm/3:30pm Patient taking differently: Take 2 tablets by mouth 3 (three) times daily. 11/05/21   Tat, Octaviano Batty, DO  Cholecalciferol (VITAMIN D3) 50 MCG (2000 UT) TABS Take 2,000 Units by mouth daily.    [provider]  digoxin (LANOXIN) 0.125 MG tablet  Take 1 tablet (0.125 mg total) daily by mouth. Patient taking differently: Take 0.125 mg by mouth See admin instructions. Take 0.125 mg by mouth once a day and hold for a heart rate less than 60 07/10/17   Croitoru, Mihai, MD  diltiazem (CARDIZEM) 30 MG tablet Take 1 tablet (30 mg total) by mouth every 8 (eight) hours. 09/11/23   Shalhoub, Deno Lunger, MD  docusate sodium (COLACE) 100 MG capsule Take 100 mg by mouth every 12 (twelve) hours as needed for mild constipation.     [provider]  ezetimibe-simvastatin (VYTORIN) 10-10 MG tablet Take 1 tablet by mouth at bedtime.    [provider]  omeprazole (PRILOSEC) 20 MG capsule Take 20 mg by mouth every other day.    [provider]  polyethylene glycol powder (GLYCOLAX/MIRALAX) 17 GM/SCOOP powder Take 17 g by mouth daily as needed for mild constipation (to be mixed into 6 ounces of fluid).    [provider]  traZODone (DESYREL) 50 MG tablet Take 50 mg by mouth at bedtime.    [provider]  Rayburn Ma Oil (ARTIFICIAL EYE) 83-15 % OINT Place 1 application  into both eyes 2 (two) times daily.    [provider]   Studies Reviewed:       *** Risk Assessment/Calculations:   {Does this patient have ATRIAL FIBRILLATION?:[id number]} No BP recorded.  {Refresh Note OR Click here to enter BP  :1}***       Physical Exam:   VS:  There were no vitals taken for this visit.   Wt Readings from Last 3 Encounters:  09/10/23 195 lb (88.5 kg)  03/07/23 195 lb (88.5 kg)  08/27/22 228 lb 13.4 oz (103.8 kg)    Physical Exam***     Assessment and Plan:  Assessment and Plan              {Are you ordering a CV Procedure (e.g. stress test, cath, DCCV, TEE, etc)?   Press F2        :[id number]}  Dispo:  No follow-ups on file.  Signed, Denyce Gedalya, NP        09/18/2023, 6:24 AM [facility] [address] Office [phone] Fax [phone]    I spent***minutes examining this patient, reviewing medications, and using patient centered shared decision making involving their cardiac care.   I spent greater than 20 minutes reviewing their past medical history,  medications, and prior cardiac tests.
